# Patient Record
Sex: Male | Born: 1955
Health system: Southern US, Community
[De-identification: ages and names within clinical notes are randomized; demographics above are authoritative.]

## PROBLEM LIST (undated history)

## (undated) DIAGNOSIS — J9621 Acute and chronic respiratory failure with hypoxia: Secondary | ICD-10-CM

## (undated) DIAGNOSIS — I69298 Other sequelae of other nontraumatic intracranial hemorrhage: Secondary | ICD-10-CM

## (undated) DIAGNOSIS — J9811 Atelectasis: Secondary | ICD-10-CM

## (undated) DIAGNOSIS — I10 Essential (primary) hypertension: Secondary | ICD-10-CM

## (undated) DIAGNOSIS — I219 Acute myocardial infarction, unspecified: Secondary | ICD-10-CM

## (undated) DIAGNOSIS — N182 Chronic kidney disease, stage 2 (mild): Secondary | ICD-10-CM

## (undated) DIAGNOSIS — I63512 Cerebral infarction due to unspecified occlusion or stenosis of left middle cerebral artery: Secondary | ICD-10-CM

## (undated) HISTORY — PX: BACK SURGERY: SHX140

---

## 2012-04-11 DIAGNOSIS — I5042 Chronic combined systolic (congestive) and diastolic (congestive) heart failure: Secondary | ICD-10-CM | POA: Insufficient documentation

## 2012-08-04 ENCOUNTER — Encounter (HOSPITAL_COMMUNITY): Payer: Self-pay | Admitting: *Deleted

## 2012-08-04 ENCOUNTER — Emergency Department (HOSPITAL_COMMUNITY)
Admission: EM | Admit: 2012-08-04 | Discharge: 2012-08-04 | Disposition: A | Discharge status: Home / Self Care | Payer: Medicare Other | Attending: Emergency Medicine | Admitting: Emergency Medicine

## 2012-08-04 ENCOUNTER — Emergency Department (HOSPITAL_COMMUNITY): Payer: Medicare Other

## 2012-08-04 DIAGNOSIS — IMO0002 Reserved for concepts with insufficient information to code with codable children: Secondary | ICD-10-CM | POA: Insufficient documentation

## 2012-08-04 DIAGNOSIS — M549 Dorsalgia, unspecified: Secondary | ICD-10-CM

## 2012-08-04 DIAGNOSIS — Z87891 Personal history of nicotine dependence: Secondary | ICD-10-CM | POA: Diagnosis not present

## 2012-08-04 DIAGNOSIS — M5416 Radiculopathy, lumbar region: Secondary | ICD-10-CM

## 2012-08-04 DIAGNOSIS — I252 Old myocardial infarction: Secondary | ICD-10-CM | POA: Diagnosis not present

## 2012-08-04 DIAGNOSIS — Y9389 Activity, other specified: Secondary | ICD-10-CM | POA: Insufficient documentation

## 2012-08-04 DIAGNOSIS — Z79899 Other long term (current) drug therapy: Secondary | ICD-10-CM | POA: Diagnosis not present

## 2012-08-04 DIAGNOSIS — Z7982 Long term (current) use of aspirin: Secondary | ICD-10-CM | POA: Insufficient documentation

## 2012-08-04 DIAGNOSIS — I10 Essential (primary) hypertension: Secondary | ICD-10-CM | POA: Insufficient documentation

## 2012-08-04 DIAGNOSIS — R209 Unspecified disturbances of skin sensation: Secondary | ICD-10-CM | POA: Diagnosis not present

## 2012-08-04 DIAGNOSIS — Y929 Unspecified place or not applicable: Secondary | ICD-10-CM | POA: Insufficient documentation

## 2012-08-04 DIAGNOSIS — W010XXA Fall on same level from slipping, tripping and stumbling without subsequent striking against object, initial encounter: Secondary | ICD-10-CM | POA: Insufficient documentation

## 2012-08-04 HISTORY — DX: Essential (primary) hypertension: I10

## 2012-08-04 HISTORY — DX: Acute myocardial infarction, unspecified: I21.9

## 2012-08-04 MED ORDER — KETOROLAC TROMETHAMINE 30 MG/ML IJ SOLN
30.0000 mg | Freq: Once | INTRAMUSCULAR | Status: AC
  Administered 2012-08-04: 30 mg via INTRAVENOUS
  Filled 2012-08-04: qty 1

## 2012-08-04 MED ORDER — NAPROXEN 375 MG PO TABS: 375.0000 mg | ORAL_TABLET | Freq: Two times a day (BID) | ORAL | Status: DC

## 2012-08-04 MED ORDER — PREDNISONE 20 MG PO TABS: ORAL_TABLET | ORAL | Status: DC

## 2012-08-04 MED ORDER — SODIUM CHLORIDE 0.9 % IV BOLUS (SEPSIS)
500.0000 mL | Freq: Once | INTRAVENOUS | Status: AC
  Administered 2012-08-04: 500 mL via INTRAVENOUS

## 2012-08-04 MED ORDER — OXYCODONE-ACETAMINOPHEN 5-325 MG PO TABS: 2.0000 | ORAL_TABLET | ORAL | Status: DC | PRN

## 2012-08-04 MED ORDER — ONDANSETRON HCL 4 MG/2ML IJ SOLN
4.0000 mg | Freq: Once | INTRAMUSCULAR | Status: AC
  Administered 2012-08-04: 4 mg via INTRAVENOUS
  Filled 2012-08-04: qty 2

## 2012-08-04 MED ORDER — HYDROMORPHONE HCL PF 1 MG/ML IJ SOLN
0.5000 mg | Freq: Once | INTRAMUSCULAR | Status: AC
  Administered 2012-08-04: 0.5 mg via INTRAVENOUS
  Filled 2012-08-04: qty 1

## 2012-08-04 NOTE — ED Provider Notes (Signed)
History     CSN: [id number]  Arrival date & time 08/04/12  1233   First MD Initiated Contact with Patient 08/04/12 1536      Chief Complaint  Patient presents with  . Leg Pain  . Numbness    (Consider location/radiation/quality/duration/timing/severity/associated sxs/prior treatment) HPI.... low back pain for 2 weeks after slipping on ice with radiation to left leg. No bowel or bladder incontinence. Patient is able to walk. Status post back surgery approximately 10 years ago for 3 herniated disc. Ambulation makes pain worse. Quality is sharp. Severity is moderate.  Past Medical History  Diagnosis Date  . Hypertension   . MI (myocardial infarction)     Past Surgical History  Procedure Laterality Date  . Back surgery      2004    No family history on file.  History  Substance Use Topics  . Smoking status: Former Smoker    Types: Cigarettes    Quit date: 07/21/2012  . Smokeless tobacco: Not on file  . Alcohol Use: Yes     Comment: occasionally      Review of Systems  All other systems reviewed and are negative.    Allergies  Review of patient's allergies indicates no known allergies.  Home Medications   Current Outpatient Rx  Name  Route  Sig  Dispense  Refill  . acetaminophen (TYLENOL) 500 MG tablet   Oral   Take 1,500 mg by mouth every 6 (six) hours as needed for pain.         Marland Kitchen amLODipine (NORVASC) 5 MG tablet   Oral   Take 5 mg by mouth every morning.          Marland Kitchen aspirin EC 81 MG tablet   Oral   Take 81 mg by mouth every morning.         . carvedilol (COREG) 6.25 MG tablet   Oral   Take 3.125 mg by mouth 2 (two) times daily with a meal.         . hydrochlorothiazide (HYDRODIURIL) 25 MG tablet   Oral   Take 25 mg by mouth daily.         Marland Kitchen lisinopril (PRINIVIL,ZESTRIL) 30 MG tablet   Oral   Take 30 mg by mouth daily.         . pravastatin (PRAVACHOL) 40 MG tablet   Oral   Take 40 mg by mouth daily.         . ranitidine  (ZANTAC) 150 MG tablet   Oral   Take 150 mg by mouth daily as needed for heartburn.         . naproxen (NAPROSYN) 375 MG tablet   Oral   Take 1 tablet (375 mg total) by mouth 2 (two) times daily.   20 tablet   0   . oxyCODONE-acetaminophen (PERCOCET) 5-325 MG per tablet   Oral   Take 2 tablets by mouth every 4 (four) hours as needed for pain.   25 tablet   0   . predniSONE (DELTASONE) 20 MG tablet      3 tabs po day one, then 2 tabs daily x 4 days   11 tablet   0     BP 153/71  Pulse 60  Temp(Src) 99.9 F (37.7 C) (Oral)  Resp 15  SpO2 99%  Physical Exam  Nursing note and vitals reviewed. Constitutional: He is oriented to person, place, and time. He appears well-developed and well-nourished.  HENT:  Head: Normocephalic and  atraumatic.  Eyes: Conjunctivae and EOM are normal. Pupils are equal, round, and reactive to light.  Neck: Normal range of motion. Neck supple.  Cardiovascular: Normal rate, regular rhythm and normal heart sounds.   Pulmonary/Chest: Effort normal and breath sounds normal.  Abdominal: Soft. Bowel sounds are normal.  Musculoskeletal:  Generalized tenderness lumbar spine. Pain with straight leg raise left greater than right.  Neurological: He is alert and oriented to person, place, and time.  No gross neuro deficits.  Skin: Skin is warm and dry.  Psychiatric: He has a normal mood and affect.    ED Course  Procedures (including critical care time)  Labs Reviewed - No data to display Dg Lumbar Spine Complete  08/04/2012  *RADIOLOGY REPORT*  Clinical Data: Back and left leg pain.  Fell 1 week ago.  LUMBAR SPINE - COMPLETE 4+ VIEW  Comparison: None.  Findings: Advanced degenerative lumbar spondylosis with multilevel disc disease.  No pars defects.  There is a compression deformity of the T11 vertebral body of uncertain age.  The bony pelvis is intact.  IMPRESSION:  1.  Degenerative lumbar spondylosis with multilevel disc disease. 2.  Compression  deformity of T11 of uncertain age.   Original Report Authenticated By: Rudie Meyer, M.D.      1. Back pain   2. Radiculopathy, lumbar region       MDM  Plain films of lumbar spine show degenerative issues at multi levels.  Additionally a compression fracture of T11 is noted.  Patient will followup with his back surgeon in [place]. Prescriptions for Percocet #25, prednisone taper, Naprosyn 375 mg #20        Donnetta Hutching, MD 08/04/12 1907

## 2012-08-04 NOTE — ED Notes (Signed)
PT slipped on ice and fell 2 weeks ago.  Since then he has been experiencing increasing pain and numbness to L leg.  Denies changes in bowel/bladder habits.  Pt states hx of lumbar back surgery 10  Years prior for same s/s, and has had no symptoms since until 2 weeks ago when he fell.

## 2013-08-02 ENCOUNTER — Emergency Department (INDEPENDENT_AMBULATORY_CARE_PROVIDER_SITE_OTHER)
Admission: EM | Admit: 2013-08-02 | Discharge: 2013-08-02 | Disposition: A | Discharge status: Home / Self Care | Payer: Medicare Other | Source: Home / Self Care

## 2013-08-02 ENCOUNTER — Encounter (HOSPITAL_COMMUNITY): Payer: Self-pay | Admitting: Family Medicine

## 2013-08-02 DIAGNOSIS — J069 Acute upper respiratory infection, unspecified: Secondary | ICD-10-CM

## 2013-08-02 DIAGNOSIS — I10 Essential (primary) hypertension: Secondary | ICD-10-CM

## 2013-08-02 DIAGNOSIS — E785 Hyperlipidemia, unspecified: Secondary | ICD-10-CM

## 2013-08-02 MED ORDER — PRAVASTATIN SODIUM 40 MG PO TABS: 40.0000 mg | ORAL_TABLET | Freq: Every day | ORAL | Status: DC

## 2013-08-02 MED ORDER — LISINOPRIL 30 MG PO TABS: 30.0000 mg | ORAL_TABLET | Freq: Every day | ORAL | Status: DC

## 2013-08-02 MED ORDER — AMLODIPINE BESYLATE 5 MG PO TABS: 5.0000 mg | ORAL_TABLET | Freq: Every morning | ORAL | Status: DC

## 2013-08-02 MED ORDER — CARVEDILOL 6.25 MG PO TABS: 3.1250 mg | ORAL_TABLET | Freq: Two times a day (BID) | ORAL | Status: DC

## 2013-08-02 MED ORDER — HYDROCHLOROTHIAZIDE 25 MG PO TABS: 25.0000 mg | ORAL_TABLET | Freq: Every day | ORAL | Status: DC

## 2013-08-02 NOTE — Discharge Instructions (Signed)
Your blood pressure and high cholesterol appear to be well controlled Please clal [facility]  Tomorrow to set up an appointment for your primary care Your  Medications were refilled for 60 days Please come back if you have any further concerns.

## 2013-08-02 NOTE — ED Notes (Signed)
Patient here for medication refill.

## 2013-08-02 NOTE — ED Provider Notes (Signed)
CSN: [id number]     Arrival date & time 08/02/13  1614 History   None    Chief Complaint  Patient presents with  . Medication Refill   (Consider location/radiation/quality/duration/timing/severity/associated sxs/prior Treatment) HPI  Pt here for medication refill. Pt w/o PCP as previous at [facility] has left. Full hx reviewed. Pt ran out of amlodipine, and carvedilol and pravastatin today. Still w/ few HCTZ and lisinopril. Denies CP, SOB, Palpitations, syncope, HA, N,V,D. No difficulty urinating. Last PCP visit 07/2012. Pt going to go back in 30-60 days. Denies any renal impairment.   Past Medical History  Diagnosis Date  . Hypertension   . MI (myocardial infarction)    Past Surgical History  Procedure Laterality Date  . Back surgery      2004   Family History  Problem Relation Age of Onset  . Hypertension Mother    History  Substance Use Topics  . Smoking status: Former Smoker    Types: Cigarettes    Quit date: 07/21/2012  . Smokeless tobacco: Not on file  . Alcohol Use: Yes     Comment: occasionally    Review of Systems  Constitutional: Negative for fever and chills.  Respiratory: Negative for shortness of breath.   Cardiovascular: Negative for chest pain, palpitations and leg swelling.  Gastrointestinal: Negative for abdominal pain and blood in stool.  Genitourinary: Negative for dysuria, urgency and decreased urine volume.  Musculoskeletal: Negative for myalgias.  Skin: Negative for rash.  All other systems reviewed and are negative.    Allergies  Review of patient's allergies indicates no known allergies.  Home Medications   Current Outpatient Rx  Name  Route  Sig  Dispense  Refill  . acetaminophen (TYLENOL) 500 MG tablet   Oral   Take 1,500 mg by mouth every 6 (six) hours as needed for pain.         Marland Kitchen amLODipine (NORVASC) 5 MG tablet   Oral   Take 1 tablet (5 mg total) by mouth every morning.   30 tablet   1   . aspirin EC 81 MG tablet   Oral   Take  81 mg by mouth every morning.         . carvedilol (COREG) 6.25 MG tablet   Oral   Take 0.5 tablets (3.125 mg total) by mouth 2 (two) times daily with a meal.   30 tablet   1   . hydrochlorothiazide (HYDRODIURIL) 25 MG tablet   Oral   Take 1 tablet (25 mg total) by mouth daily.   30 tablet   1   . lisinopril (PRINIVIL,ZESTRIL) 30 MG tablet   Oral   Take 1 tablet (30 mg total) by mouth daily.   30 tablet   1   . naproxen (NAPROSYN) 375 MG tablet   Oral   Take 1 tablet (375 mg total) by mouth 2 (two) times daily.   20 tablet   0   . oxyCODONE-acetaminophen (PERCOCET) 5-325 MG per tablet   Oral   Take 2 tablets by mouth every 4 (four) hours as needed for pain.   25 tablet   0   . pravastatin (PRAVACHOL) 40 MG tablet   Oral   Take 1 tablet (40 mg total) by mouth daily.   30 tablet   1   . predniSONE (DELTASONE) 20 MG tablet      3 tabs po day one, then 2 tabs daily x 4 days   11 tablet   0   .  ranitidine (ZANTAC) 150 MG tablet   Oral   Take 150 mg by mouth daily as needed for heartburn.          BP 122/79  Pulse 59  Temp(Src) 98.8 F (37.1 C) (Oral)  Resp 16  SpO2 100% Physical Exam  Constitutional: He appears well-developed and well-nourished. No distress.  HENT:  Head: Normocephalic and atraumatic.  Eyes: EOM are normal. Pupils are equal, round, and reactive to light.  Neck: Normal range of motion. Neck supple.  Cardiovascular: Normal rate, regular rhythm, normal heart sounds and intact distal pulses.  Exam reveals no gallop and no friction rub.   No murmur heard. Pulmonary/Chest: Effort normal. No respiratory distress. He has wheezes. He has no rales. He exhibits no tenderness.  Abdominal: Soft. He exhibits no distension.  Musculoskeletal: Normal range of motion. He exhibits no edema and no tenderness.  Neurological: He is alert.  Skin: Skin is warm. No rash noted. He is not diaphoretic.  Psychiatric: He has a normal mood and affect. His behavior  is normal. Judgment and thought content normal.    ED Course  Procedures (including critical care time) Labs Review Labs Reviewed - No data to display Imaging Review No results found.   MDM   1. Hypertension   2. URI (upper respiratory infection)   3. HLD (hyperlipidemia)    HTN: at goal today. Refill medications for 60 days to allow time for re-establishment of care at [facility]. Pt aware of warning signs for MI or elevated/low BP  URI: wheezing on exam but better per pt. Denies any SOB or asthma or COPD hx. Pt w/ occasional cigarette use (once in a blue moon). No need for steroids or ABX at this time. No h/o inh use  HLD: h/o MI concerning but on statin. Likely will need increased potency dose. Refill prava at this time. F/u [facility]  Linna Darner, MD Family Medicine PGY-3 08/02/2013, 6:08 PM      Waldemar Dickens, MD 08/02/13 [phone]

## 2013-08-03 NOTE — ED Provider Notes (Signed)
Medical screening examination/treatment/procedure(s) were performed by a resident physician or non-physician practitioner and as the supervising physician I was immediately available for consultation/collaboration.  Lynne Leader, MD    Gregor Hams, MD 08/03/13 [phone]

## 2013-10-17 ENCOUNTER — Emergency Department (INDEPENDENT_AMBULATORY_CARE_PROVIDER_SITE_OTHER)
Admission: EM | Admit: 2013-10-17 | Discharge: 2013-10-17 | Disposition: A | Discharge status: Home / Self Care | Payer: Medicare Other | Source: Home / Self Care | Attending: Family Medicine | Admitting: Family Medicine

## 2013-10-17 ENCOUNTER — Encounter (HOSPITAL_COMMUNITY): Payer: Self-pay | Admitting: Emergency Medicine

## 2013-10-17 DIAGNOSIS — I10 Essential (primary) hypertension: Secondary | ICD-10-CM | POA: Diagnosis not present

## 2013-10-17 MED ORDER — AMLODIPINE BESYLATE 5 MG PO TABS: 5.0000 mg | ORAL_TABLET | Freq: Every morning | ORAL | Status: DC

## 2013-10-17 MED ORDER — LISINOPRIL 30 MG PO TABS: 30.0000 mg | ORAL_TABLET | Freq: Every day | ORAL | Status: DC

## 2013-10-17 MED ORDER — CARVEDILOL 6.25 MG PO TABS: 3.1250 mg | ORAL_TABLET | Freq: Two times a day (BID) | ORAL | Status: DC

## 2013-10-17 MED ORDER — PRAVASTATIN SODIUM 40 MG PO TABS: 40.0000 mg | ORAL_TABLET | Freq: Every day | ORAL | Status: DC

## 2013-10-17 MED ORDER — HYDROCHLOROTHIAZIDE 25 MG PO TABS: 25.0000 mg | ORAL_TABLET | Freq: Every day | ORAL | Status: DC

## 2013-10-17 NOTE — ED Provider Notes (Signed)
CSN: [id number]     Arrival date & time 10/17/13  1330 History   First MD Initiated Contact with Patient 10/17/13 1409     Chief Complaint  Patient presents with  . Medication Refill   (Consider location/radiation/quality/duration/timing/severity/associated sxs/prior Treatment) Patient is a 58 y.o. male presenting with hypertension. The history is provided by the patient.  Hypertension This is a chronic problem. Episode onset: here for med refills on bp, no local md, seen here 3/17 last for refills. The problem has not changed since onset.Pertinent negatives include no chest pain, no headaches and no shortness of breath.    Past Medical History  Diagnosis Date  . Hypertension   . MI (myocardial infarction)    Past Surgical History  Procedure Laterality Date  . Back surgery      2004   Family History  Problem Relation Age of Onset  . Hypertension Mother    History  Substance Use Topics  . Smoking status: Former Smoker    Types: Cigarettes    Quit date: 07/21/2012  . Smokeless tobacco: Not on file  . Alcohol Use: Yes     Comment: occasionally    Review of Systems  Constitutional: Negative.   Respiratory: Negative.  Negative for shortness of breath.   Cardiovascular: Negative.  Negative for chest pain and leg swelling.  Neurological: Negative for headaches.    Allergies  Review of patient's allergies indicates no known allergies.  Home Medications   Prior to Admission medications   Medication Sig Start Date End Date Taking? Authorizing Provider  aspirin EC 81 MG tablet Take 81 mg by mouth every morning.   Yes Historical Provider, MD  acetaminophen (TYLENOL) 500 MG tablet Take 1,500 mg by mouth every 6 (six) hours as needed for pain.    Historical Provider, MD  amLODipine (NORVASC) 5 MG tablet Take 1 tablet (5 mg total) by mouth every morning. 10/17/13   Billy Fischer, MD  carvedilol (COREG) 6.25 MG tablet Take 0.5 tablets (3.125 mg total) by mouth 2 (two) times daily  with a meal. 10/17/13   Billy Fischer, MD  hydrochlorothiazide (HYDRODIURIL) 25 MG tablet Take 1 tablet (25 mg total) by mouth daily. 10/17/13   Billy Fischer, MD  lisinopril (PRINIVIL,ZESTRIL) 30 MG tablet Take 1 tablet (30 mg total) by mouth daily. 10/17/13   Billy Fischer, MD  naproxen (NAPROSYN) 375 MG tablet Take 1 tablet (375 mg total) by mouth 2 (two) times daily. 08/04/12   Nat Christen, MD  oxyCODONE-acetaminophen (PERCOCET) 5-325 MG per tablet Take 2 tablets by mouth every 4 (four) hours as needed for pain. 08/04/12   Nat Christen, MD  pravastatin (PRAVACHOL) 40 MG tablet Take 1 tablet (40 mg total) by mouth daily. 10/17/13   Billy Fischer, MD  predniSONE (DELTASONE) 20 MG tablet 3 tabs po day one, then 2 tabs daily x 4 days 08/04/12   Nat Christen, MD  ranitidine (ZANTAC) 150 MG tablet Take 150 mg by mouth daily as needed for heartburn.    Historical Provider, MD   BP 145/93  Pulse 60  Temp(Src) 98.7 F (37.1 C) (Oral)  Resp 16  SpO2 98% Physical Exam  Nursing note and vitals reviewed. Constitutional: He is oriented to person, place, and time. He appears well-developed and well-nourished. No distress.  Neck: Normal range of motion. Neck supple. No thyromegaly present.  Cardiovascular: Regular rhythm and normal heart sounds.   Pulmonary/Chest: Effort normal and breath sounds normal.  Musculoskeletal: He  exhibits no edema.  Lymphadenopathy:    He has no cervical adenopathy.  Neurological: He is alert and oriented to person, place, and time.  Skin: Skin is warm and dry.    ED Course  Procedures (including critical care time) Labs Review Labs Reviewed - No data to display  Imaging Review No results found.   MDM   1. Hypertension        Billy Fischer, MD 10/17/13 1441

## 2013-10-17 NOTE — ED Notes (Signed)
States he needs Rx refills

## 2013-10-17 NOTE — Discharge Instructions (Signed)
See your doctor for further refills. °

## 2013-11-08 ENCOUNTER — Encounter (HOSPITAL_COMMUNITY): Payer: Self-pay | Admitting: Emergency Medicine

## 2013-11-08 ENCOUNTER — Emergency Department (INDEPENDENT_AMBULATORY_CARE_PROVIDER_SITE_OTHER)
Admission: EM | Admit: 2013-11-08 | Discharge: 2013-11-08 | Disposition: A | Discharge status: Home / Self Care | Payer: Medicare Other | Source: Home / Self Care | Attending: Family Medicine | Admitting: Family Medicine

## 2013-11-08 DIAGNOSIS — B86 Scabies: Secondary | ICD-10-CM | POA: Diagnosis not present

## 2013-11-08 DIAGNOSIS — L219 Seborrheic dermatitis, unspecified: Secondary | ICD-10-CM

## 2013-11-08 MED ORDER — PERMETHRIN 5 % EX CREA: TOPICAL_CREAM | CUTANEOUS | Status: DC

## 2013-11-08 MED ORDER — HYDROCORTISONE VALERATE 0.2 % EX OINT: 1.0000 "application " | TOPICAL_OINTMENT | Freq: Two times a day (BID) | CUTANEOUS | Status: DC

## 2013-11-08 NOTE — ED Provider Notes (Signed)
CSN: [id number]     Arrival date & time 11/08/13  1400 History   First MD Initiated Contact with Patient 11/08/13 1503     Chief Complaint  Patient presents with  . Rash   (Consider location/radiation/quality/duration/timing/severity/associated sxs/prior Treatment) Patient is a 58 y.o. male presenting with rash. The history is provided by the patient.  Rash Location:  Shoulder/arm, face, torso and finger Facial rash location:  Face Shoulder/arm rash location:  L hand, R hand, L forearm and R forearm Quality: dryness, itchiness and scaling   Severity:  Mild Onset quality:  Sudden Duration:  2 weeks Progression:  Worsening Chronicity:  New Context comment:  Girlfriend with scabies., similar rash.   Past Medical History  Diagnosis Date  . Hypertension   . MI (myocardial infarction)    Past Surgical History  Procedure Laterality Date  . Back surgery      2004   Family History  Problem Relation Age of Onset  . Hypertension Mother    History  Substance Use Topics  . Smoking status: Former Smoker    Types: Cigarettes    Quit date: 07/21/2012  . Smokeless tobacco: Not on file  . Alcohol Use: Yes     Comment: occasionally    Review of Systems  Constitutional: Negative.   Skin: Positive for rash.    Allergies  Review of patient's allergies indicates no known allergies.  Home Medications   Prior to Admission medications   Medication Sig Start Date End Date Taking? Authorizing Provider  amLODipine (NORVASC) 5 MG tablet Take 1 tablet (5 mg total) by mouth every morning. 10/17/13  Yes Billy Fischer, MD  carvedilol (COREG) 6.25 MG tablet Take 0.5 tablets (3.125 mg total) by mouth 2 (two) times daily with a meal. 10/17/13  Yes Billy Fischer, MD  hydrochlorothiazide (HYDRODIURIL) 25 MG tablet Take 1 tablet (25 mg total) by mouth daily. 10/17/13  Yes Billy Fischer, MD  lisinopril (PRINIVIL,ZESTRIL) 30 MG tablet Take 1 tablet (30 mg total) by mouth daily. 10/17/13  Yes Billy Fischer, MD  pravastatin (PRAVACHOL) 40 MG tablet Take 1 tablet (40 mg total) by mouth daily. 10/17/13  Yes Billy Fischer, MD  acetaminophen (TYLENOL) 500 MG tablet Take 1,500 mg by mouth every 6 (six) hours as needed for pain.    Historical Provider, MD  aspirin EC 81 MG tablet Take 81 mg by mouth every morning.    Historical Provider, MD  hydrocortisone valerate ointment (WEST-CORT) 0.2 % Apply 1 application topically 2 (two) times daily. To eyebrows and forehead as needed. 11/08/13   Billy Fischer, MD  naproxen (NAPROSYN) 375 MG tablet Take 1 tablet (375 mg total) by mouth 2 (two) times daily. 08/04/12   Nat Christen, MD  oxyCODONE-acetaminophen (PERCOCET) 5-325 MG per tablet Take 2 tablets by mouth every 4 (four) hours as needed for pain. 08/04/12   Nat Christen, MD  permethrin (ELIMITE) 5 % cream Apply as directed, from neck down, repeat in 1 week. 11/08/13   Billy Fischer, MD  predniSONE (DELTASONE) 20 MG tablet 3 tabs po day one, then 2 tabs daily x 4 days 08/04/12   Nat Christen, MD  ranitidine (ZANTAC) 150 MG tablet Take 150 mg by mouth daily as needed for heartburn.    Historical Provider, MD   BP 142/90  Pulse 74  Temp(Src) 99.2 F (37.3 C) (Oral)  Resp 16  SpO2 97% Physical Exam  Nursing note and vitals reviewed. Constitutional: He is  oriented to person, place, and time. He appears well-developed and well-nourished.  Neurological: He is alert and oriented to person, place, and time.  Skin: Skin is warm and dry. Rash noted.  papulo crusting lesions on ext with dry scaly rash on eyebrows.    ED Course  Procedures (including critical care time) Labs Review Labs Reviewed - No data to display  Imaging Review No results found.   MDM   1. Scabies   2. Seborrheic dermatitis        Billy Fischer, MD 11/08/13 1534

## 2013-11-08 NOTE — ED Notes (Signed)
Pt c/o rash onset 1 week on arms and face Reports GF is being treated for scabies Denies f/v/n/d, cold sx Alert w/no signs of acute distress.

## 2013-12-27 ENCOUNTER — Encounter: Payer: Self-pay | Admitting: Internal Medicine

## 2013-12-27 ENCOUNTER — Ambulatory Visit: Payer: Medicare Other | Attending: Internal Medicine | Admitting: Internal Medicine

## 2013-12-27 VITALS — BP 160/90 | HR 97 | Temp 97.8°F | Resp 18 | Ht 67.0 in | Wt 190.6 lb

## 2013-12-27 DIAGNOSIS — I10 Essential (primary) hypertension: Secondary | ICD-10-CM

## 2013-12-27 DIAGNOSIS — I252 Old myocardial infarction: Secondary | ICD-10-CM | POA: Diagnosis not present

## 2013-12-27 DIAGNOSIS — Z1211 Encounter for screening for malignant neoplasm of colon: Secondary | ICD-10-CM

## 2013-12-27 DIAGNOSIS — M549 Dorsalgia, unspecified: Secondary | ICD-10-CM | POA: Diagnosis not present

## 2013-12-27 DIAGNOSIS — E785 Hyperlipidemia, unspecified: Secondary | ICD-10-CM | POA: Diagnosis present

## 2013-12-27 DIAGNOSIS — Z9889 Other specified postprocedural states: Secondary | ICD-10-CM | POA: Diagnosis not present

## 2013-12-27 DIAGNOSIS — Z7982 Long term (current) use of aspirin: Secondary | ICD-10-CM | POA: Insufficient documentation

## 2013-12-27 DIAGNOSIS — Z8249 Family history of ischemic heart disease and other diseases of the circulatory system: Secondary | ICD-10-CM | POA: Insufficient documentation

## 2013-12-27 DIAGNOSIS — IMO0001 Reserved for inherently not codable concepts without codable children: Secondary | ICD-10-CM | POA: Insufficient documentation

## 2013-12-27 DIAGNOSIS — F17200 Nicotine dependence, unspecified, uncomplicated: Secondary | ICD-10-CM | POA: Diagnosis not present

## 2013-12-27 LAB — COMPLETE METABOLIC PANEL WITH GFR
ALBUMIN: 4.9 g/dL (ref 3.5–5.2)
ALT: 21 U/L (ref 0–53)
AST: 22 U/L (ref 0–37)
Alkaline Phosphatase: 45 U/L (ref 39–117)
BILIRUBIN TOTAL: 0.5 mg/dL (ref 0.2–1.2)
BUN: 27 mg/dL — ABNORMAL HIGH (ref 6–23)
CALCIUM: 10.5 mg/dL (ref 8.4–10.5)
CHLORIDE: 108 meq/L (ref 96–112)
CO2: 25 meq/L (ref 19–32)
Creat: 1.59 mg/dL — ABNORMAL HIGH (ref 0.50–1.35)
GFR, EST AFRICAN AMERICAN: 54 mL/min — AB
GFR, Est Non African American: 46 mL/min — ABNORMAL LOW
GLUCOSE: 114 mg/dL — AB (ref 70–99)
POTASSIUM: 4.2 meq/L (ref 3.5–5.3)
SODIUM: 140 meq/L (ref 135–145)
TOTAL PROTEIN: 7.6 g/dL (ref 6.0–8.3)

## 2013-12-27 LAB — LIPID PANEL
Cholesterol: 142 mg/dL (ref 0–200)
HDL: 37 mg/dL — ABNORMAL LOW (ref >39)
LDL CALC: 65 mg/dL (ref 0–99)
TRIGLYCERIDES: 198 mg/dL — AB (ref <150)
Total CHOL/HDL Ratio: 3.8 Ratio
VLDL: 40 mg/dL (ref 0–40)

## 2013-12-27 LAB — CBC WITH DIFFERENTIAL/PLATELET
Basophils Absolute: 0.1 10*3/uL (ref 0.0–0.1)
Basophils Relative: 1 % (ref 0–1)
Eosinophils Absolute: 0.7 10*3/uL (ref 0.0–0.7)
Eosinophils Relative: 11 % — ABNORMAL HIGH (ref 0–5)
HCT: 37.9 % — ABNORMAL LOW (ref 39.0–52.0)
HEMOGLOBIN: 12.9 g/dL — AB (ref 13.0–17.0)
LYMPHS ABS: 1.5 10*3/uL (ref 0.7–4.0)
LYMPHS PCT: 24 % (ref 12–46)
MCH: 31.7 pg (ref 26.0–34.0)
MCHC: 34 g/dL (ref 30.0–36.0)
MCV: 93.1 fL (ref 78.0–100.0)
MONOS PCT: 9 % (ref 3–12)
Monocytes Absolute: 0.6 10*3/uL (ref 0.1–1.0)
NEUTROS ABS: 3.5 10*3/uL (ref 1.7–7.7)
NEUTROS PCT: 55 % (ref 43–77)
PLATELETS: 358 10*3/uL (ref 150–400)
RBC: 4.07 MIL/uL — AB (ref 4.22–5.81)
RDW: 15.4 % (ref 11.5–15.5)
WBC: 6.3 10*3/uL (ref 4.0–10.5)

## 2013-12-27 MED ORDER — AMLODIPINE BESYLATE 5 MG PO TABS: 5.0000 mg | ORAL_TABLET | Freq: Every morning | ORAL | Status: DC

## 2013-12-27 MED ORDER — CARVEDILOL 6.25 MG PO TABS: 3.1250 mg | ORAL_TABLET | Freq: Two times a day (BID) | ORAL | Status: DC

## 2013-12-27 MED ORDER — PRAVASTATIN SODIUM 40 MG PO TABS: 40.0000 mg | ORAL_TABLET | Freq: Every day | ORAL | Status: DC

## 2013-12-27 MED ORDER — HYDROCHLOROTHIAZIDE 25 MG PO TABS: 25.0000 mg | ORAL_TABLET | Freq: Every day | ORAL | Status: DC

## 2013-12-27 MED ORDER — CLONIDINE HCL 0.1 MG PO TABS
0.2000 mg | ORAL_TABLET | Freq: Once | ORAL | Status: AC
  Administered 2013-12-27: 0.2 mg via ORAL

## 2013-12-27 MED ORDER — LISINOPRIL 30 MG PO TABS: 30.0000 mg | ORAL_TABLET | Freq: Every day | ORAL | Status: DC

## 2013-12-27 NOTE — Progress Notes (Signed)
Pt comes in to establish care for Hx uncontrolled hypertension Need all medication refilled Ran out yesterday BP- 187/110 states he didn't take medication today Denies chest pain or headache

## 2013-12-27 NOTE — Progress Notes (Signed)
Patient Demographics  Riley Wagner, is a 58 y.o. male  OFB:[id number]  POE:[id number]  DOB - 1955/10/23  CC:  Chief Complaint  Patient presents with  . Establish Care  . Hypertension  . Hyperlipidemia  . Medication Refill       HPI: Riley Wagner is a 58 y.o. male here today to establish medical care. Patient has history of hypertension hyperlipidemia, as per patient he was told that he had MI in the past, denies any chest and shortness of breath, patient is requesting refill on his medications today his blood pressure was elevated since he ran out of his meds, he was given clonidine, repeat manual blood pressure is 160/90. Patient also history of chronic back pain had surgery done in the past, takes Aleve when necessary.  Patient has No headache, No chest pain, No abdominal pain - No Nausea, No new weakness tingling or numbness, No Cough - SOB.  No Known Allergies Past Medical History  Diagnosis Date  . Hypertension   . MI (myocardial infarction)    Current Outpatient Prescriptions on File Prior to Visit  Medication Sig Dispense Refill  . acetaminophen (TYLENOL) 500 MG tablet Take 1,500 mg by mouth every 6 (six) hours as needed for pain.      Marland Kitchen aspirin EC 81 MG tablet Take 81 mg by mouth every morning.      . hydrocortisone valerate ointment (WEST-CORT) 0.2 % Apply 1 application topically 2 (two) times daily. To eyebrows and forehead as needed.  15 g  1  . naproxen (NAPROSYN) 375 MG tablet Take 1 tablet (375 mg total) by mouth 2 (two) times daily.  20 tablet  0  . oxyCODONE-acetaminophen (PERCOCET) 5-325 MG per tablet Take 2 tablets by mouth every 4 (four) hours as needed for pain.  25 tablet  0  . permethrin (ELIMITE) 5 % cream Apply as directed, from neck down, repeat in 1 week.  60 g  1  . predniSONE (DELTASONE) 20 MG tablet 3 tabs po day one, then 2 tabs daily x 4 days  11 tablet  0  . ranitidine (ZANTAC) 150 MG tablet Take 150 mg by mouth daily as needed for heartburn.        No current facility-administered medications on file prior to visit.   Family History  Problem Relation Age of Onset  . Hypertension Mother   . Diabetes Mother   . Stroke Mother    History   Social History  . Marital Status: Single    Spouse Name: N/A    Number of Children: N/A  . Years of Education: N/A   Occupational History  . Not on file.   Social History Main Topics  . Smoking status: Current Every Day Smoker    Types: Cigarettes    Last Attempt to Quit: 07/21/2012  . Smokeless tobacco: Not on file  . Alcohol Use: Yes     Comment: occasionally  . Drug Use: No  . Sexual Activity: Not on file   Other Topics Concern  . Not on file   Social History Narrative  . No narrative on file    Review of Systems: Constitutional: Negative for fever, chills, diaphoresis, activity change, appetite change and fatigue. HENT: Negative for ear pain, nosebleeds, congestion, facial swelling, rhinorrhea, neck pain, neck stiffness and ear discharge.  Eyes: Negative for pain, discharge, redness, itching and visual disturbance. Respiratory: Negative for cough, choking, chest tightness, shortness of breath, wheezing and stridor.  Cardiovascular: Negative for  chest pain, palpitations and leg swelling. Gastrointestinal: Negative for abdominal distention. Genitourinary: Negative for dysuria, urgency, frequency, hematuria, flank pain, decreased urine volume, difficulty urinating and dyspareunia.  Musculoskeletal: Negative for back pain, joint swelling, arthralgia and gait problem. Neurological: Negative for dizziness, tremors, seizures, syncope, facial asymmetry, speech difficulty, weakness, light-headedness, numbness and headaches.  Hematological: Negative for adenopathy. Does not bruise/bleed easily. Psychiatric/Behavioral: Negative for hallucinations, behavioral problems, confusion, dysphoric mood, decreased concentration and agitation.    Objective:   Filed Vitals:   12/27/13  1151  BP: 160/90  Pulse:   Temp:   Resp:     Physical Exam: Constitutional: Patient appears well-developed and well-nourished. No distress. HENT: Normocephalic, atraumatic, External right and left ear normal. Oropharynx is clear and moist.  Eyes: Conjunctivae and EOM are normal. PERRLA, no scleral icterus. Neck: Normal ROM. Neck supple. No JVD. No tracheal deviation. No thyromegaly. CVS: RRR, S1/S2 +, no murmurs, no gallops, no carotid bruit.  Pulmonary: Effort and breath sounds normal, no stridor, rhonchi, wheezes, rales.  Abdominal: Soft. BS +, no distension, tenderness, rebound or guarding.  Musculoskeletal: Normal range of motion. No edema and no tenderness.  Neuro: Alert. Normal reflexes, muscle tone coordination. No cranial nerve deficit. Skin: Skin is warm and dry. No rash noted. Not diaphoretic. No erythema. No pallor. Psychiatric: Normal mood and affect. Behavior, judgment, thought content normal.  No results found for this basename: WBC, HGB, HCT, MCV, PLT   No results found for this basename: CREATININE, BUN, NA, K, CL, CO2    No results found for this basename: HGBA1C   Lipid Panel  No results found for this basename: chol, trig, hdl, cholhdl, vldl, ldlcalc       Assessment and plan:   1. Essential hypertension Blood pressure is elevated because patient ran out of his meds, we'll resume back on his medications also advise for DASH diet, he'll come back in 2 weeks for BP check.  - cloNIDine (CATAPRES) tablet 0.2 mg; Take 2 tablets (0.2 mg total) by mouth once. - amLODipine (NORVASC) 5 MG tablet; Take 1 tablet (5 mg total) by mouth every morning.  Dispense: 30 tablet; Refill: 3 - carvedilol (COREG) 6.25 MG tablet; Take 0.5 tablets (3.125 mg total) by mouth 2 (two) times daily with a meal.  Dispense: 30 tablet; Refill: 3 - hydrochlorothiazide (HYDRODIURIL) 25 MG tablet; Take 1 tablet (25 mg total) by mouth daily.  Dispense: 30 tablet; Refill: 3 - lisinopril  (PRINIVIL,ZESTRIL) 30 MG tablet; Take 1 tablet (30 mg total) by mouth daily.  Dispense: 30 tablet; Refill: 3 - CBC with Differential - COMPLETE METABOLIC PANEL WITH GFR - TSH  2. Other and unspecified hyperlipidemia Patient is on Pravachol will repeat lipid panel. - pravastatin (PRAVACHOL) 40 MG tablet; Take 1 tablet (40 mg total) by mouth daily.  Dispense: 30 tablet; Refill: 3 - Lipid panel  3. History of back surgery Currently takes Aleve when necessary for pain.  4. Special screening for malignant neoplasms, colon Referred to GI.  6. Smoking Advised patient to quit smoking.       Health Maintenance -Colonoscopy: referred to GI   Return in about 3 months (around 03/29/2014) for hypertension, hyperipidemia, BP check in 2 weeks/Nurse Visit.    Lorayne Marek, MD

## 2013-12-27 NOTE — Patient Instructions (Signed)
DASH Eating Plan °DASH stands for "Dietary Approaches to Stop Hypertension." The DASH eating plan is a healthy eating plan that has been shown to reduce high blood pressure (hypertension). Additional health benefits may include reducing the risk of type 2 diabetes mellitus, heart disease, and stroke. The DASH eating plan may also help with weight loss. °WHAT DO I NEED TO KNOW ABOUT THE DASH EATING PLAN? °For the DASH eating plan, you will follow these general guidelines: °· Choose foods with a percent daily value for sodium of less than 5% (as listed on the food label). °· Use salt-free seasonings or herbs instead of table salt or sea salt. °· Check with your health care provider or pharmacist before using salt substitutes. °· Eat lower-sodium products, often labeled as "lower sodium" or "no salt added." °· Eat fresh foods. °· Eat more vegetables, fruits, and low-fat dairy products. °· Choose whole grains. Look for the word "whole" as the first word in the ingredient list. °· Choose fish and skinless chicken or turkey more often than red meat. Limit fish, poultry, and meat to 6 oz (170 g) each day. °· Limit sweets, desserts, sugars, and sugary drinks. °· Choose heart-healthy fats. °· Limit cheese to 1 oz (28 g) per day. °· Eat more home-cooked food and less restaurant, buffet, and fast food. °· Limit fried foods. °· Cook foods using methods other than frying. °· Limit canned vegetables. If you do use them, rinse them well to decrease the sodium. °· When eating at a restaurant, ask that your food be prepared with less salt, or no salt if possible. °WHAT FOODS CAN I EAT? °Seek help from a dietitian for individual calorie needs. °Grains °Whole grain or whole wheat bread. Brown rice. Whole grain or whole wheat pasta. Quinoa, bulgur, and whole grain cereals. Low-sodium cereals. Corn or whole wheat flour tortillas. Whole grain cornbread. Whole grain crackers. Low-sodium crackers. °Vegetables °Fresh or frozen vegetables  (raw, steamed, roasted, or grilled). Low-sodium or reduced-sodium tomato and vegetable juices. Low-sodium or reduced-sodium tomato sauce and paste. Low-sodium or reduced-sodium canned vegetables.  °Fruits °All fresh, canned (in natural juice), or frozen fruits. °Meat and Other Protein Products °Ground beef (85% or leaner), grass-fed beef, or beef trimmed of fat. Skinless chicken or turkey. Ground chicken or turkey. Pork trimmed of fat. All fish and seafood. Eggs. Dried beans, peas, or lentils. Unsalted nuts and seeds. Unsalted canned beans. °Dairy °Low-fat dairy products, such as skim or 1% milk, 2% or reduced-fat cheeses, low-fat ricotta or cottage cheese, or plain low-fat yogurt. Low-sodium or reduced-sodium cheeses. °Fats and Oils °Tub margarines without trans fats. Light or reduced-fat mayonnaise and salad dressings (reduced sodium). Avocado. Safflower, olive, or canola oils. Natural peanut or almond butter. °Other °Unsalted popcorn and pretzels. °The items listed above may not be a complete list of recommended foods or beverages. Contact your dietitian for more options. °WHAT FOODS ARE NOT RECOMMENDED? °Grains °White bread. White pasta. White rice. Refined cornbread. Bagels and croissants. Crackers that contain trans fat. °Vegetables °Creamed or fried vegetables. Vegetables in a cheese sauce. Regular canned vegetables. Regular canned tomato sauce and paste. Regular tomato and vegetable juices. °Fruits °Dried fruits. Canned fruit in light or heavy syrup. Fruit juice. °Meat and Other Protein Products °Fatty cuts of meat. Ribs, chicken wings, bacon, sausage, bologna, salami, chitterlings, fatback, hot dogs, bratwurst, and packaged luncheon meats. Salted nuts and seeds. Canned beans with salt. °Dairy °Whole or 2% milk, cream, half-and-half, and cream cheese. Whole-fat or sweetened yogurt. Full-fat   cheeses or blue cheese. Nondairy creamers and whipped toppings. Processed cheese, cheese spreads, or cheese  curds. °Condiments °Onion and garlic salt, seasoned salt, table salt, and sea salt. Canned and packaged gravies. Worcestershire sauce. Tartar sauce. Barbecue sauce. Teriyaki sauce. Soy sauce, including reduced sodium. Steak sauce. Fish sauce. Oyster sauce. Cocktail sauce. Horseradish. Ketchup and mustard. Meat flavorings and tenderizers. Bouillon cubes. Hot sauce. Tabasco sauce. Marinades. Taco seasonings. Relishes. °Fats and Oils °Butter, stick margarine, lard, shortening, ghee, and bacon fat. Coconut, palm kernel, or palm oils. Regular salad dressings. °Other °Pickles and olives. Salted popcorn and pretzels. °The items listed above may not be a complete list of foods and beverages to avoid. Contact your dietitian for more information. °WHERE CAN I FIND MORE INFORMATION? °National Heart, [employer], and [employer] Institute: www.nhlbi.nih.gov/health/health-topics/topics/dash/ °Document Released: 04/24/2011 Document Revised: 09/19/2013 Document Reviewed: 03/09/2013 °ExitCare® Patient Information ©2015 [employer]. This information is not intended to replace advice given to you by your health care provider. Make sure you discuss any questions you have with your health care provider. ° °

## 2013-12-28 LAB — TSH: TSH: 1.998 u[IU]/mL (ref 0.350–4.500)

## 2013-12-29 ENCOUNTER — Telehealth: Payer: Self-pay | Admitting: Emergency Medicine

## 2013-12-29 NOTE — Telephone Encounter (Signed)
Message copied by Ricci Barker on Thu Dec 29, 2013  3:35 PM ------      Message from: Lorayne Marek      Created: Wed Dec 28, 2013 10:45 AM       Blood work reviewed noticed impaired fasting glucose, call and advise patient for low carbohydrate diet.      also noticed elevated triglycerides, advise patient for low fat diet.       Patient has  renal insufficiency most likely secondary to HTN , will repeat blood chemistry on next visit, if persistently abnormal Bun/Cr then will consider referral to Nephrology. ------

## 2013-12-29 NOTE — Telephone Encounter (Signed)
Pt given lab results with instructions on dieting and exercise control Informed pt we will recheck kidney function with next visit

## 2014-01-03 ENCOUNTER — Telehealth: Payer: Self-pay

## 2014-01-03 NOTE — Telephone Encounter (Signed)
Message copied by Dorothe Pea on Tue Jan 03, 2014 11:17 AM ------      Message from: Lorayne Marek      Created: Wed Dec 28, 2013 10:45 AM       Blood work reviewed noticed impaired fasting glucose, call and advise patient for low carbohydrate diet.      also noticed elevated triglycerides, advise patient for low fat diet.       Patient has  renal insufficiency most likely secondary to HTN , will repeat blood chemistry on next visit, if persistently abnormal Bun/Cr then will consider referral to Nephrology. ------

## 2014-04-26 ENCOUNTER — Other Ambulatory Visit: Payer: Self-pay | Admitting: Gastroenterology

## 2014-04-27 ENCOUNTER — Telehealth: Payer: Self-pay | Admitting: Internal Medicine

## 2014-04-27 NOTE — Telephone Encounter (Signed)
Pharmacy is calling to request a medication refill for Amlodipine 5mg , Coreg 6.25mg , Hydrodiuril 25mg , Lisinopril 30mg , and Pravachol 40mg . Pharmacy stated that [place]. [place] and needs refill before he can be seen. Please f/u with pt.

## 2014-04-28 ENCOUNTER — Telehealth: Payer: Self-pay | Admitting: Emergency Medicine

## 2014-04-28 ENCOUNTER — Other Ambulatory Visit: Payer: Self-pay | Admitting: Emergency Medicine

## 2014-04-28 DIAGNOSIS — I10 Essential (primary) hypertension: Secondary | ICD-10-CM

## 2014-04-28 DIAGNOSIS — E785 Hyperlipidemia, unspecified: Secondary | ICD-10-CM

## 2014-04-28 NOTE — Telephone Encounter (Signed)
Pt called in requesting medication refills on Amlodopine, Coreg,Hydrodiuril,Lisinopril and Pravachol Pt is requesting meds be sent to [facility] in [place] unsure of number;he will call me later

## 2014-05-29 ENCOUNTER — Other Ambulatory Visit: Payer: Self-pay | Admitting: Internal Medicine

## 2014-05-29 DIAGNOSIS — I10 Essential (primary) hypertension: Secondary | ICD-10-CM

## 2014-05-29 MED ORDER — LISINOPRIL 30 MG PO TABS: 30.0000 mg | ORAL_TABLET | Freq: Every day | ORAL | Status: DC

## 2014-05-29 MED ORDER — HYDROCHLOROTHIAZIDE 25 MG PO TABS: 25.0000 mg | ORAL_TABLET | Freq: Every day | ORAL | Status: DC

## 2014-05-29 MED ORDER — CARVEDILOL 6.25 MG PO TABS: 3.1250 mg | ORAL_TABLET | Freq: Two times a day (BID) | ORAL | Status: DC

## 2014-05-29 MED ORDER — AMLODIPINE BESYLATE 5 MG PO TABS: 5.0000 mg | ORAL_TABLET | Freq: Every morning | ORAL | Status: DC

## 2014-05-29 NOTE — Telephone Encounter (Signed)
Rx was refilled, advised to make F/U appointment with PCP for futures refills

## 2014-05-29 NOTE — Telephone Encounter (Signed)
Pharmacy is calling to request a med refills for all of his current medications, pharmacy states that he has been out of his medications for a while. Please f/u with pharmacy.

## 2014-06-20 ENCOUNTER — Ambulatory Visit: Payer: Medicare Other | Admitting: Internal Medicine

## 2014-06-28 ENCOUNTER — Ambulatory Visit: Payer: Medicare Other | Attending: Internal Medicine | Admitting: Internal Medicine

## 2014-06-28 ENCOUNTER — Encounter: Payer: Self-pay | Admitting: Internal Medicine

## 2014-06-28 VITALS — BP 140/80 | HR 71 | Temp 98.2°F | Ht 67.0 in | Wt 185.0 lb

## 2014-06-28 DIAGNOSIS — I252 Old myocardial infarction: Secondary | ICD-10-CM | POA: Diagnosis not present

## 2014-06-28 DIAGNOSIS — Z9889 Other specified postprocedural states: Secondary | ICD-10-CM

## 2014-06-28 DIAGNOSIS — I10 Essential (primary) hypertension: Secondary | ICD-10-CM | POA: Diagnosis not present

## 2014-06-28 DIAGNOSIS — E785 Hyperlipidemia, unspecified: Secondary | ICD-10-CM | POA: Diagnosis not present

## 2014-06-28 DIAGNOSIS — Z87891 Personal history of nicotine dependence: Secondary | ICD-10-CM | POA: Insufficient documentation

## 2014-06-28 DIAGNOSIS — G8929 Other chronic pain: Secondary | ICD-10-CM

## 2014-06-28 DIAGNOSIS — Z791 Long term (current) use of non-steroidal anti-inflammatories (NSAID): Secondary | ICD-10-CM | POA: Diagnosis not present

## 2014-06-28 DIAGNOSIS — Z833 Family history of diabetes mellitus: Secondary | ICD-10-CM

## 2014-06-28 DIAGNOSIS — N289 Disorder of kidney and ureter, unspecified: Secondary | ICD-10-CM | POA: Diagnosis not present

## 2014-06-28 DIAGNOSIS — R7301 Impaired fasting glucose: Secondary | ICD-10-CM | POA: Diagnosis not present

## 2014-06-28 DIAGNOSIS — M545 Low back pain: Secondary | ICD-10-CM

## 2014-06-28 LAB — COMPLETE METABOLIC PANEL WITH GFR
ALBUMIN: 4.6 g/dL (ref 3.5–5.2)
ALT: 34 U/L (ref 0–53)
AST: 27 U/L (ref 0–37)
Alkaline Phosphatase: 66 U/L (ref 39–117)
BUN: 22 mg/dL (ref 6–23)
CHLORIDE: 102 meq/L (ref 96–112)
CO2: 28 meq/L (ref 19–32)
CREATININE: 1.37 mg/dL — AB (ref 0.50–1.35)
Calcium: 11.2 mg/dL — ABNORMAL HIGH (ref 8.4–10.5)
GFR, EST AFRICAN AMERICAN: 64 mL/min
GFR, Est Non African American: 56 mL/min — ABNORMAL LOW
Glucose, Bld: 107 mg/dL — ABNORMAL HIGH (ref 70–99)
POTASSIUM: 4.6 meq/L (ref 3.5–5.3)
Sodium: 141 mEq/L (ref 135–145)
TOTAL PROTEIN: 7.9 g/dL (ref 6.0–8.3)
Total Bilirubin: 0.7 mg/dL (ref 0.2–1.2)

## 2014-06-28 MED ORDER — AMLODIPINE BESYLATE 5 MG PO TABS: 5.0000 mg | ORAL_TABLET | Freq: Every morning | ORAL | Status: DC

## 2014-06-28 MED ORDER — LISINOPRIL 30 MG PO TABS: 30.0000 mg | ORAL_TABLET | Freq: Every day | ORAL | Status: DC

## 2014-06-28 MED ORDER — TRAMADOL HCL 50 MG PO TABS: 50.0000 mg | ORAL_TABLET | Freq: Three times a day (TID) | ORAL | Status: DC | PRN

## 2014-06-28 MED ORDER — GABAPENTIN 300 MG PO CAPS: 300.0000 mg | ORAL_CAPSULE | Freq: Three times a day (TID) | ORAL | Status: DC

## 2014-06-28 MED ORDER — HYDROCHLOROTHIAZIDE 25 MG PO TABS: 25.0000 mg | ORAL_TABLET | Freq: Every day | ORAL | Status: DC

## 2014-06-28 MED ORDER — CARVEDILOL 6.25 MG PO TABS: 3.1250 mg | ORAL_TABLET | Freq: Two times a day (BID) | ORAL | Status: DC

## 2014-06-28 MED ORDER — PRAVASTATIN SODIUM 40 MG PO TABS: 40.0000 mg | ORAL_TABLET | Freq: Every day | ORAL | Status: DC

## 2014-06-28 NOTE — Progress Notes (Signed)
MRN: [id number] Name: Riley Wagner  Sex: male Age: 59 y.o. DOB: 28-Jul-1955  Allergies: Review of patient's allergies indicates no known allergies.  Chief Complaint  Patient presents with  . Follow-up    HPI: Patient is 59 y.o. male who has to of hypertension, hyperlipidemia, history of back surgery, chronic lower back pain, comes today for followup today's blood pressure is borderline elevated, manual blood pressure is 140/80, as per patient he has been taking his medications, he reported that sometimes his blood pressure when he checks at home is 125/80,previous blood work reviewed noticed renal insufficiency, as per patient for the back pain he has been taking naproxen, I have advised patient not to take any NSAIDs, patient has not followed up with orthopedics since the back surgery.  Past Medical History  Diagnosis Date  . Hypertension   . MI (myocardial infarction)     Past Surgical History  Procedure Laterality Date  . Back surgery      2004      Medication List       This list is accurate as of: 06/28/14 11:25 AM.  Always use your most recent med list.               acetaminophen 500 MG tablet  Commonly known as:  TYLENOL  Take 1,500 mg by mouth every 6 (six) hours as needed for pain.     amLODipine 5 MG tablet  Commonly known as:  NORVASC  Take 1 tablet (5 mg total) by mouth every morning. Pt need PCP F/U for future refills     carvedilol 6.25 MG tablet  Commonly known as:  COREG  Take 0.5 tablets (3.125 mg total) by mouth 2 (two) times daily with a meal. Needs office visit for future refills     gabapentin 300 MG capsule  Commonly known as:  NEURONTIN  Take 1 capsule (300 mg total) by mouth 3 (three) times daily.     hydrochlorothiazide 25 MG tablet  Commonly known as:  HYDRODIURIL  Take 1 tablet (25 mg total) by mouth daily.     hydrocortisone valerate ointment 0.2 %  Commonly known as:  WEST-CORT  Apply 1 application topically 2 (two) times  daily. To eyebrows and forehead as needed.     lisinopril 30 MG tablet  Commonly known as:  PRINIVIL,ZESTRIL  Take 1 tablet (30 mg total) by mouth daily.     naproxen 375 MG tablet  Commonly known as:  NAPROSYN  Take 1 tablet (375 mg total) by mouth 2 (two) times daily.     oxyCODONE-acetaminophen 5-325 MG per tablet  Commonly known as:  PERCOCET  Take 2 tablets by mouth every 4 (four) hours as needed for pain.     permethrin 5 % cream  Commonly known as:  ELIMITE  Apply as directed, from neck down, repeat in 1 week.     pravastatin 40 MG tablet  Commonly known as:  PRAVACHOL  Take 1 tablet (40 mg total) by mouth daily.     ranitidine 150 MG tablet  Commonly known as:  ZANTAC  Take 150 mg by mouth daily as needed for heartburn.     traMADol 50 MG tablet  Commonly known as:  ULTRAM  Take 1 tablet (50 mg total) by mouth every 8 (eight) hours as needed for moderate pain.        Meds ordered this encounter  Medications  . amLODipine (NORVASC) 5 MG tablet    Sig: Take  1 tablet (5 mg total) by mouth every morning. Pt need PCP F/U for future refills    Dispense:  30 tablet    Refill:  3  . lisinopril (PRINIVIL,ZESTRIL) 30 MG tablet    Sig: Take 1 tablet (30 mg total) by mouth daily.    Dispense:  30 tablet    Refill:  3    Needs office visit for future refills  . carvedilol (COREG) 6.25 MG tablet    Sig: Take 0.5 tablets (3.125 mg total) by mouth 2 (two) times daily with a meal. Needs office visit for future refills    Dispense:  30 tablet    Refill:  3  . hydrochlorothiazide (HYDRODIURIL) 25 MG tablet    Sig: Take 1 tablet (25 mg total) by mouth daily.    Dispense:  30 tablet    Refill:  3    Needs office visit for future refills  . gabapentin (NEURONTIN) 300 MG capsule    Sig: Take 1 capsule (300 mg total) by mouth 3 (three) times daily.    Dispense:  90 capsule    Refill:  3  . traMADol (ULTRAM) 50 MG tablet    Sig: Take 1 tablet (50 mg total) by mouth every 8  (eight) hours as needed for moderate pain.    Dispense:  30 tablet    Refill:  0  . pravastatin (PRAVACHOL) 40 MG tablet    Sig: Take 1 tablet (40 mg total) by mouth daily.    Dispense:  30 tablet    Refill:  3     There is no immunization history on file for this patient.  Family History  Problem Relation Age of Onset  . Hypertension Mother   . Diabetes Mother   . Stroke Mother     History  Substance Use Topics  . Smoking status: Former Smoker -- 0.20 packs/day for 25 years    Types: Cigarettes    Quit date: 06/21/2014  . Smokeless tobacco: Not on file  . Alcohol Use: 0.0 oz/week    0 Standard drinks or equivalent per week     Comment: occasionally    Review of Systems   As noted in HPI  Filed Vitals:   06/28/14 1118  BP: 140/80  Pulse:   Temp:     Physical Exam  Physical Exam  Constitutional: No distress.  Eyes: EOM are normal. Pupils are equal, round, and reactive to light.  Cardiovascular: Normal rate and regular rhythm.   Pulmonary/Chest: Breath sounds normal. No respiratory distress. He has no wheezes. He has no rales.  Musculoskeletal:  Lower lumbar old mid line surgical scar, some paraspinal tenderness     CBC    Component Value Date/Time   WBC 6.3 12/27/2013 1158   RBC 4.07* 12/27/2013 1158   HGB 12.9* 12/27/2013 1158   HCT 37.9* 12/27/2013 1158   PLT 358 12/27/2013 1158   MCV 93.1 12/27/2013 1158   LYMPHSABS 1.5 12/27/2013 1158   MONOABS 0.6 12/27/2013 1158   EOSABS 0.7 12/27/2013 1158   BASOSABS 0.1 12/27/2013 1158    CMP     Component Value Date/Time   NA 140 12/27/2013 1158   K 4.2 12/27/2013 1158   CL 108 12/27/2013 1158   CO2 25 12/27/2013 1158   GLUCOSE 114* 12/27/2013 1158   BUN 27* 12/27/2013 1158   CREATININE 1.59* 12/27/2013 1158   CALCIUM 10.5 12/27/2013 1158   PROT 7.6 12/27/2013 1158   ALBUMIN 4.9 12/27/2013 1158  AST 22 12/27/2013 1158   ALT 21 12/27/2013 1158   ALKPHOS 45 12/27/2013 1158   BILITOT 0.5  12/27/2013 1158   GFRNONAA 46* 12/27/2013 1158   GFRAA 54* 12/27/2013 1158    Lab Results  Component Value Date/Time   CHOL 142 12/27/2013 11:58 AM    No components found for: HGA1C  Lab Results  Component Value Date/Time   AST 22 12/27/2013 11:58 AM    Assessment and Plan  Essential hypertension - Plan: amLODipine (NORVASC) 5 MG tablet, lisinopril (PRINIVIL,ZESTRIL) 30 MG tablet, carvedilol (COREG) 6.25 MG tablet, hydrochlorothiazide (HYDRODIURIL) 25 MG tablet, COMPLETE METABOLIC PANEL WITH GFR  Hyperlipidemia - Plan: continue with pravastatin (PRAVACHOL) 40 MG tablet, we'll check fasting lipid panel on the next visit.  IFG (impaired fasting glucose) Advise patient for low carbohydrate diet.  Family history of diabetes mellitus (DM) - Plan: COMPLETE METABOLIC PANEL WITH GFR  History of back surgery - Plan: Ambulatory referral to Orthopedic Surgery  Chronic lower back pain - Plan: gabapentin (NEURONTIN) 300 MG capsule, traMADol (ULTRAM) 50 MG tablet, Ambulatory referral to Orthopedic Surgery  Renal insufficiency - Plan: I have advised patient not to take any NSAIDs, will repeat blood chemistry COMPLETE METABOLIC PANEL WITH GFR   Health Maintenance -Colonoscopy: patient is scheduled  - -Vaccinations: Patient declines flu shot   Return in about 3 months (around 09/26/2014) for hypertension.  Lorayne Marek, MD

## 2014-06-28 NOTE — Progress Notes (Signed)
Patient is here for follow-up of HTN.  Pt states he has taken all his medications this morning. He had a cup of unsweet tea for breakfast this morning.  Pt would like for Dr. Annitta Needs to review the xray that was done of his back on 07-2012.

## 2014-06-28 NOTE — Patient Instructions (Addendum)
DASH Eating Plan DASH stands for "Dietary Approaches to Stop Hypertension." The DASH eating plan is a healthy eating plan that has been shown to reduce high blood pressure (hypertension). Additional health benefits may include reducing the risk of type 2 diabetes mellitus, heart disease, and stroke. The DASH eating plan may also help with weight loss. WHAT DO I NEED TO KNOW ABOUT THE DASH EATING PLAN? For the DASH eating plan, you will follow these general guidelines:  Choose foods with a percent daily value for sodium of less than 5% (as listed on the food label).  Use salt-free seasonings or herbs instead of table salt or sea salt.  Check with your health care provider or pharmacist before using salt substitutes.  Eat lower-sodium products, often labeled as "lower sodium" or "no salt added."  Eat fresh foods.  Eat more vegetables, fruits, and low-fat dairy products.  Choose whole grains. Look for the word "whole" as the first word in the ingredient list.  Choose fish and skinless chicken or turkey more often than red meat. Limit fish, poultry, and meat to 6 oz (170 g) each day.  Limit sweets, desserts, sugars, and sugary drinks.  Choose heart-healthy fats.  Limit cheese to 1 oz (28 g) per day.  Eat more home-cooked food and less restaurant, buffet, and fast food.  Limit fried foods.  Cook foods using methods other than frying.  Limit canned vegetables. If you do use them, rinse them well to decrease the sodium.  When eating at a restaurant, ask that your food be prepared with less salt, or no salt if possible. WHAT FOODS CAN I EAT? Seek help from a dietitian for individual calorie needs. Grains Whole grain or whole wheat bread. Brown rice. Whole grain or whole wheat pasta. Quinoa, bulgur, and whole grain cereals. Low-sodium cereals. Corn or whole wheat flour tortillas. Whole grain cornbread. Whole grain crackers. Low-sodium crackers. Vegetables Fresh or frozen vegetables  (raw, steamed, roasted, or grilled). Low-sodium or reduced-sodium tomato and vegetable juices. Low-sodium or reduced-sodium tomato sauce and paste. Low-sodium or reduced-sodium canned vegetables.  Fruits All fresh, canned (in natural juice), or frozen fruits. Meat and Other Protein Products Ground beef (85% or leaner), grass-fed beef, or beef trimmed of fat. Skinless chicken or turkey. Ground chicken or turkey. Pork trimmed of fat. All fish and seafood. Eggs. Dried beans, peas, or lentils. Unsalted nuts and seeds. Unsalted canned beans. Dairy Low-fat dairy products, such as skim or 1% milk, 2% or reduced-fat cheeses, low-fat ricotta or cottage cheese, or plain low-fat yogurt. Low-sodium or reduced-sodium cheeses. Fats and Oils Tub margarines without trans fats. Light or reduced-fat mayonnaise and salad dressings (reduced sodium). Avocado. Safflower, olive, or canola oils. Natural peanut or almond butter. Other Unsalted popcorn and pretzels. The items listed above may not be a complete list of recommended foods or beverages. Contact your dietitian for more options. WHAT FOODS ARE NOT RECOMMENDED? Grains White bread. White pasta. White rice. Refined cornbread. Bagels and croissants. Crackers that contain trans fat. Vegetables Creamed or fried vegetables. Vegetables in a cheese sauce. Regular canned vegetables. Regular canned tomato sauce and paste. Regular tomato and vegetable juices. Fruits Dried fruits. Canned fruit in light or heavy syrup. Fruit juice. Meat and Other Protein Products Fatty cuts of meat. Ribs, chicken wings, bacon, sausage, bologna, salami, chitterlings, fatback, hot dogs, bratwurst, and packaged luncheon meats. Salted nuts and seeds. Canned beans with salt. Dairy Whole or 2% milk, cream, half-and-half, and cream cheese. Whole-fat or sweetened yogurt. Full-fat   cheeses or blue cheese. Nondairy creamers and whipped toppings. Processed cheese, cheese spreads, or cheese  curds. Condiments Onion and garlic salt, seasoned salt, table salt, and sea salt. Canned and packaged gravies. Worcestershire sauce. Tartar sauce. Barbecue sauce. Teriyaki sauce. Soy sauce, including reduced sodium. Steak sauce. Fish sauce. Oyster sauce. Cocktail sauce. Horseradish. Ketchup and mustard. Meat flavorings and tenderizers. Bouillon cubes. Hot sauce. Tabasco sauce. Marinades. Taco seasonings. Relishes. Fats and Oils Butter, stick margarine, lard, shortening, ghee, and bacon fat. Coconut, palm kernel, or palm oils. Regular salad dressings. Other Pickles and olives. Salted popcorn and pretzels. The items listed above may not be a complete list of foods and beverages to avoid. Contact your dietitian for more information. WHERE CAN I FIND MORE INFORMATION? National Heart, Lung, and [employer] Institute: www.nhlbi.nih.gov/health/health-topics/topics/dash/ Document Released: 04/24/2011 Document Revised: 09/19/2013 Document Reviewed: 03/09/2013 ExitCare Patient Information 2015 [employer]. This information is not intended to replace advice given to you by your health care provider. Make sure you discuss any questions you have with your health care provider. Diabetes Mellitus and Food It is important for you to manage your blood sugar (glucose) level. Your blood glucose level can be greatly affected by what you eat. Eating healthier foods in the appropriate amounts throughout the day at about the same time each day will help you control your blood glucose level. It can also help slow or prevent worsening of your diabetes mellitus. Healthy eating may even help you improve the level of your blood pressure and reach or maintain a healthy weight.  HOW CAN FOOD AFFECT ME? Carbohydrates Carbohydrates affect your blood glucose level more than any other type of food. Your dietitian will help you determine how many carbohydrates to eat at each meal and teach you how to count carbohydrates. Counting  carbohydrates is important to keep your blood glucose at a healthy level, especially if you are using insulin or taking certain medicines for diabetes mellitus. Alcohol Alcohol can cause sudden decreases in blood glucose (hypoglycemia), especially if you use insulin or take certain medicines for diabetes mellitus. Hypoglycemia can be a life-threatening condition. Symptoms of hypoglycemia (sleepiness, dizziness, and disorientation) are similar to symptoms of having too much alcohol.  If your health care provider has given you approval to drink alcohol, do so in moderation and use the following guidelines:  Women should not have more than one drink per day, and men should not have more than two drinks per day. One drink is equal to:  12 oz of beer.  5 oz of wine.  1 oz of hard liquor.  Do not drink on an empty stomach.  Keep yourself hydrated. Have water, diet soda, or unsweetened iced tea.  Regular soda, juice, and other mixers might contain a lot of carbohydrates and should be counted. WHAT FOODS ARE NOT RECOMMENDED? As you make food choices, it is important to remember that all foods are not the same. Some foods have fewer nutrients per serving than other foods, even though they might have the same number of calories or carbohydrates. It is difficult to get your body what it needs when you eat foods with fewer nutrients. Examples of foods that you should avoid that are high in calories and carbohydrates but low in nutrients include:  Trans fats (most processed foods list trans fats on the Nutrition Facts label).  Regular soda.  Juice.  Candy.  Sweets, such as cake, pie, doughnuts, and cookies.  Fried foods. WHAT FOODS CAN I EAT? Have nutrient-rich foods,   which will nourish your body and keep you healthy. The food you should eat also will depend on several factors, including:  The calories you need.  The medicines you take.  Your weight.  Your blood glucose level.  Your  blood pressure level.  Your cholesterol level. You also should eat a variety of foods, including:  Protein, such as meat, poultry, fish, tofu, nuts, and seeds (lean animal proteins are best).  Fruits.  Vegetables.  Dairy products, such as milk, cheese, and yogurt (low fat is best).  Breads, grains, pasta, cereal, rice, and beans.  Fats such as olive oil, trans fat-free margarine, canola oil, avocado, and olives. DOES EVERYONE WITH DIABETES MELLITUS HAVE THE SAME MEAL PLAN? Because every person with diabetes mellitus is different, there is not one meal plan that works for everyone. It is very important that you meet with a dietitian who will help you create a meal plan that is just right for you. Document Released: 01/30/2005 Document Revised: 05/10/2013 Document Reviewed: 04/01/2013 ExitCare Patient Information 2015 [employer]. This information is not intended to replace advice given to you by your health care provider. Make sure you discuss any questions you have with your health care provider.  

## 2014-06-29 ENCOUNTER — Telehealth: Payer: Self-pay

## 2014-06-29 NOTE — Telephone Encounter (Signed)
Patient is aware of his lab results 

## 2014-06-29 NOTE — Telephone Encounter (Signed)
-----   Message from Lorayne Marek, MD sent at 06/29/2014  9:31 AM EST ----- Call and let the patient know that his blood work shows improvement in his renal function, advise patient to avoid NSAIDs, also noticed his calcium level is borderline elevated, will repeat blood chemistry on the following visit.and will PTH level

## 2014-07-07 ENCOUNTER — Other Ambulatory Visit: Payer: Self-pay | Admitting: Gastroenterology

## 2014-07-14 NOTE — Progress Notes (Signed)
RN lefr message for patient to return call on 2019-16, 07-11-14, 07-12-14, 07-13-13, patient never returned RN call

## 2014-07-18 ENCOUNTER — Ambulatory Visit (HOSPITAL_COMMUNITY): Admission: RE | Admit: 2014-07-18 | Payer: Medicare Other | Source: Ambulatory Visit | Admitting: Gastroenterology

## 2014-07-18 ENCOUNTER — Encounter (HOSPITAL_COMMUNITY): Payer: Self-pay | Admitting: Anesthesiology

## 2014-07-18 ENCOUNTER — Encounter (HOSPITAL_COMMUNITY): Admission: RE | Payer: Self-pay | Source: Ambulatory Visit

## 2014-07-18 SURGERY — COLONOSCOPY WITH PROPOFOL
Anesthesia: Monitor Anesthesia Care

## 2014-07-18 NOTE — Anesthesia Preprocedure Evaluation (Deleted)
Anesthesia Evaluation  Patient identified by MRN, date of birth, ID band Patient awake    Reviewed: Allergy & Precautions, NPO status , Patient's Chart, lab work & pertinent test results  Airway        Dental   Pulmonary former smoker,          Cardiovascular hypertension, + CAD and + Past MI     Neuro/Psych    GI/Hepatic   Endo/Other    Renal/GU      Musculoskeletal   Abdominal   Peds  Hematology   Anesthesia Other Findings   Reproductive/Obstetrics                            Anesthesia Physical Anesthesia Plan  ASA: III  Anesthesia Plan: MAC   Post-op Pain Management:    Induction: Intravenous  Airway Management Planned: Simple Face Mask  Additional Equipment:   Intra-op Plan:   Post-operative Plan:   Informed Consent: I have reviewed the patients History and Physical, chart, labs and discussed the procedure including the risks, benefits and alternatives for the proposed anesthesia with the patient or authorized representative who has indicated his/her understanding and acceptance.     Plan Discussed with: CRNA, Anesthesiologist and Surgeon  Anesthesia Plan Comments:        Anesthesia Quick Evaluation

## 2014-10-05 ENCOUNTER — Emergency Department (HOSPITAL_COMMUNITY): Payer: Medicare Other

## 2014-10-05 ENCOUNTER — Encounter (HOSPITAL_COMMUNITY): Payer: Self-pay | Admitting: Physical Medicine and Rehabilitation

## 2014-10-05 ENCOUNTER — Emergency Department (HOSPITAL_COMMUNITY)
Admission: EM | Admit: 2014-10-05 | Discharge: 2014-10-05 | Disposition: A | Discharge status: Home / Self Care | Payer: Medicare Other | Attending: Emergency Medicine | Admitting: Emergency Medicine

## 2014-10-05 DIAGNOSIS — Z87891 Personal history of nicotine dependence: Secondary | ICD-10-CM | POA: Insufficient documentation

## 2014-10-05 DIAGNOSIS — Y939 Activity, unspecified: Secondary | ICD-10-CM | POA: Diagnosis not present

## 2014-10-05 DIAGNOSIS — Y92410 Unspecified street and highway as the place of occurrence of the external cause: Secondary | ICD-10-CM | POA: Insufficient documentation

## 2014-10-05 DIAGNOSIS — I252 Old myocardial infarction: Secondary | ICD-10-CM | POA: Insufficient documentation

## 2014-10-05 DIAGNOSIS — G8929 Other chronic pain: Secondary | ICD-10-CM | POA: Insufficient documentation

## 2014-10-05 DIAGNOSIS — R200 Anesthesia of skin: Secondary | ICD-10-CM | POA: Insufficient documentation

## 2014-10-05 DIAGNOSIS — M5442 Lumbago with sciatica, left side: Secondary | ICD-10-CM | POA: Diagnosis not present

## 2014-10-05 DIAGNOSIS — S3992XA Unspecified injury of lower back, initial encounter: Secondary | ICD-10-CM | POA: Diagnosis present

## 2014-10-05 DIAGNOSIS — Z79899 Other long term (current) drug therapy: Secondary | ICD-10-CM | POA: Insufficient documentation

## 2014-10-05 DIAGNOSIS — Y999 Unspecified external cause status: Secondary | ICD-10-CM | POA: Diagnosis not present

## 2014-10-05 DIAGNOSIS — I10 Essential (primary) hypertension: Secondary | ICD-10-CM | POA: Diagnosis not present

## 2014-10-05 DIAGNOSIS — M545 Low back pain: Secondary | ICD-10-CM | POA: Diagnosis not present

## 2014-10-05 DIAGNOSIS — S199XXA Unspecified injury of neck, initial encounter: Secondary | ICD-10-CM | POA: Insufficient documentation

## 2014-10-05 MED ORDER — CYCLOBENZAPRINE HCL 5 MG PO TABS: 5.0000 mg | ORAL_TABLET | Freq: Three times a day (TID) | ORAL | Status: DC | PRN

## 2014-10-05 MED ORDER — HYDROCODONE-ACETAMINOPHEN 5-325 MG PO TABS
1.0000 | ORAL_TABLET | Freq: Once | ORAL | Status: AC
  Administered 2014-10-05: 1 via ORAL
  Filled 2014-10-05: qty 1

## 2014-10-05 MED ORDER — HYDROCODONE-ACETAMINOPHEN 5-325 MG PO TABS: 1.0000 | ORAL_TABLET | Freq: Four times a day (QID) | ORAL | Status: DC | PRN
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
## 2014-10-05 NOTE — ED Notes (Signed)
NP Hope at the bedside.

## 2014-10-05 NOTE — ED Notes (Signed)
Patient returned from X-ray 

## 2014-11-28 ENCOUNTER — Encounter: Payer: Self-pay | Admitting: Internal Medicine

## 2014-11-28 ENCOUNTER — Ambulatory Visit: Payer: Medicare Other | Attending: Internal Medicine | Admitting: Internal Medicine

## 2014-11-28 VITALS — BP 160/90 | HR 80 | Temp 98.0°F | Resp 16 | Wt 186.0 lb

## 2014-11-28 DIAGNOSIS — R030 Elevated blood-pressure reading, without diagnosis of hypertension: Secondary | ICD-10-CM | POA: Diagnosis not present

## 2014-11-28 DIAGNOSIS — M545 Low back pain, unspecified: Secondary | ICD-10-CM

## 2014-11-28 DIAGNOSIS — I10 Essential (primary) hypertension: Secondary | ICD-10-CM | POA: Diagnosis not present

## 2014-11-28 DIAGNOSIS — G8929 Other chronic pain: Secondary | ICD-10-CM | POA: Insufficient documentation

## 2014-11-28 DIAGNOSIS — Z79899 Other long term (current) drug therapy: Secondary | ICD-10-CM | POA: Diagnosis not present

## 2014-11-28 DIAGNOSIS — Z87891 Personal history of nicotine dependence: Secondary | ICD-10-CM | POA: Insufficient documentation

## 2014-11-28 DIAGNOSIS — IMO0001 Reserved for inherently not codable concepts without codable children: Secondary | ICD-10-CM

## 2014-11-28 MED ORDER — GABAPENTIN 300 MG PO CAPS: 300.0000 mg | ORAL_CAPSULE | Freq: Three times a day (TID) | ORAL | Status: DC

## 2014-11-28 MED ORDER — AMLODIPINE BESYLATE 5 MG PO TABS: 5.0000 mg | ORAL_TABLET | Freq: Every morning | ORAL | Status: DC

## 2014-11-28 MED ORDER — LISINOPRIL 30 MG PO TABS: 30.0000 mg | ORAL_TABLET | Freq: Every day | ORAL | Status: DC

## 2014-11-28 MED ORDER — CLONIDINE HCL 0.1 MG PO TABS
0.2000 mg | ORAL_TABLET | Freq: Once | ORAL | Status: AC
Start: 2014-11-28 — End: 2014-11-28
  Administered 2014-11-28: 0.2 mg via ORAL

## 2014-11-28 MED ORDER — HYDROCHLOROTHIAZIDE 25 MG PO TABS: 25.0000 mg | ORAL_TABLET | Freq: Every day | ORAL | Status: DC

## 2014-11-28 MED ORDER — CARVEDILOL 6.25 MG PO TABS: 3.1250 mg | ORAL_TABLET | Freq: Two times a day (BID) | ORAL | Status: DC

## 2014-11-28 MED ORDER — TRAMADOL HCL 50 MG PO TABS: 50.0000 mg | ORAL_TABLET | Freq: Three times a day (TID) | ORAL | Status: DC | PRN
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
## 2014-11-28 NOTE — Patient Instructions (Signed)
DASH Eating Plan °DASH stands for "Dietary Approaches to Stop Hypertension." The DASH eating plan is a healthy eating plan that has been shown to reduce high blood pressure (hypertension). Additional health benefits may include reducing the risk of type 2 diabetes mellitus, heart disease, and stroke. The DASH eating plan may also help with weight loss. °WHAT DO I NEED TO KNOW ABOUT THE DASH EATING PLAN? °For the DASH eating plan, you will follow these general guidelines: °· Choose foods with a percent daily value for sodium of less than 5% (as listed on the food label). °· Use salt-free seasonings or herbs instead of table salt or sea salt. °· Check with your health care provider or pharmacist before using salt substitutes. °· Eat lower-sodium products, often labeled as "lower sodium" or "no salt added." °· Eat fresh foods. °· Eat more vegetables, fruits, and low-fat dairy products. °· Choose whole grains. Look for the word "whole" as the first word in the ingredient list. °· Choose fish and skinless chicken or turkey more often than red meat. Limit fish, poultry, and meat to 6 oz (170 g) each day. °· Limit sweets, desserts, sugars, and sugary drinks. °· Choose heart-healthy fats. °· Limit cheese to 1 oz (28 g) per day. °· Eat more home-cooked food and less restaurant, buffet, and fast food. °· Limit fried foods. °· Cook foods using methods other than frying. °· Limit canned vegetables. If you do use them, rinse them well to decrease the sodium. °· When eating at a restaurant, ask that your food be prepared with less salt, or no salt if possible. °WHAT FOODS CAN I EAT? °Seek help from a dietitian for individual calorie needs. °Grains °Whole grain or whole wheat bread. Brown rice. Whole grain or whole wheat pasta. Quinoa, bulgur, and whole grain cereals. Low-sodium cereals. Corn or whole wheat flour tortillas. Whole grain cornbread. Whole grain crackers. Low-sodium crackers. °Vegetables °Fresh or frozen vegetables  (raw, steamed, roasted, or grilled). Low-sodium or reduced-sodium tomato and vegetable juices. Low-sodium or reduced-sodium tomato sauce and paste. Low-sodium or reduced-sodium canned vegetables.  °Fruits °All fresh, canned (in natural juice), or frozen fruits. °Meat and Other Protein Products °Ground beef (85% or leaner), grass-fed beef, or beef trimmed of fat. Skinless chicken or turkey. Ground chicken or turkey. Pork trimmed of fat. All fish and seafood. Eggs. Dried beans, peas, or lentils. Unsalted nuts and seeds. Unsalted canned beans. °Dairy °Low-fat dairy products, such as skim or 1% milk, 2% or reduced-fat cheeses, low-fat ricotta or cottage cheese, or plain low-fat yogurt. Low-sodium or reduced-sodium cheeses. °Fats and Oils °Tub margarines without trans fats. Light or reduced-fat mayonnaise and salad dressings (reduced sodium). Avocado. Safflower, olive, or canola oils. Natural peanut or almond butter. °Other °Unsalted popcorn and pretzels. °The items listed above may not be a complete list of recommended foods or beverages. Contact your dietitian for more options. °WHAT FOODS ARE NOT RECOMMENDED? °Grains °White bread. White pasta. White rice. Refined cornbread. Bagels and croissants. Crackers that contain trans fat. °Vegetables °Creamed or fried vegetables. Vegetables in a cheese sauce. Regular canned vegetables. Regular canned tomato sauce and paste. Regular tomato and vegetable juices. °Fruits °Dried fruits. Canned fruit in light or heavy syrup. Fruit juice. °Meat and Other Protein Products °Fatty cuts of meat. Ribs, chicken wings, bacon, sausage, bologna, salami, chitterlings, fatback, hot dogs, bratwurst, and packaged luncheon meats. Salted nuts and seeds. Canned beans with salt. °Dairy °Whole or 2% milk, cream, half-and-half, and cream cheese. Whole-fat or sweetened yogurt. Full-fat   cheeses or blue cheese. Nondairy creamers and whipped toppings. Processed cheese, cheese spreads, or cheese  curds. °Condiments °Onion and garlic salt, seasoned salt, table salt, and sea salt. Canned and packaged gravies. Worcestershire sauce. Tartar sauce. Barbecue sauce. Teriyaki sauce. Soy sauce, including reduced sodium. Steak sauce. Fish sauce. Oyster sauce. Cocktail sauce. Horseradish. Ketchup and mustard. Meat flavorings and tenderizers. Bouillon cubes. Hot sauce. Tabasco sauce. Marinades. Taco seasonings. Relishes. °Fats and Oils °Butter, stick margarine, lard, shortening, ghee, and bacon fat. Coconut, palm kernel, or palm oils. Regular salad dressings. °Other °Pickles and olives. Salted popcorn and pretzels. °The items listed above may not be a complete list of foods and beverages to avoid. Contact your dietitian for more information. °WHERE CAN I FIND MORE INFORMATION? °National Heart, [employer], and [employer] Institute: www.nhlbi.nih.gov/health/health-topics/topics/dash/ °Document Released: 04/24/2011 Document Revised: 09/19/2013 Document Reviewed: 03/09/2013 °ExitCare® Patient Information ©2015 [employer]. This information is not intended to replace advice given to you by your health care provider. Make sure you discuss any questions you have with your health care provider. ° °
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
## 2014-12-22 ENCOUNTER — Ambulatory Visit: Payer: Medicare Other | Attending: Internal Medicine | Admitting: Pharmacist

## 2014-12-22 ENCOUNTER — Encounter: Payer: Self-pay | Admitting: Internal Medicine

## 2014-12-22 ENCOUNTER — Encounter: Payer: Self-pay | Admitting: Pharmacist

## 2014-12-22 ENCOUNTER — Ambulatory Visit (HOSPITAL_BASED_OUTPATIENT_CLINIC_OR_DEPARTMENT_OTHER): Payer: Medicare Other | Admitting: Internal Medicine

## 2014-12-22 VITALS — BP 152/90 | HR 57 | Temp 98.1°F | Resp 18 | Ht 67.0 in | Wt 185.6 lb

## 2014-12-22 VITALS — BP 142/101 | HR 72

## 2014-12-22 DIAGNOSIS — Z9114 Patient's other noncompliance with medication regimen: Secondary | ICD-10-CM

## 2014-12-22 DIAGNOSIS — IMO0001 Reserved for inherently not codable concepts without codable children: Secondary | ICD-10-CM

## 2014-12-22 DIAGNOSIS — G8929 Other chronic pain: Secondary | ICD-10-CM | POA: Diagnosis not present

## 2014-12-22 DIAGNOSIS — M545 Low back pain, unspecified: Secondary | ICD-10-CM

## 2014-12-22 DIAGNOSIS — R030 Elevated blood-pressure reading, without diagnosis of hypertension: Secondary | ICD-10-CM

## 2014-12-22 MED ORDER — TRAMADOL HCL 50 MG PO TABS: 50.0000 mg | ORAL_TABLET | Freq: Three times a day (TID) | ORAL | Status: DC | PRN

## 2014-12-22 MED ORDER — CYCLOBENZAPRINE HCL 5 MG PO TABS: 5.0000 mg | ORAL_TABLET | Freq: Three times a day (TID) | ORAL | Status: DC | PRN

## 2014-12-22 NOTE — Patient Instructions (Signed)
It was nice to see you today.  Please are blood pressure
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
## 2014-12-22 NOTE — Progress Notes (Signed)
Patient case was discussed with Chari Manning. Will have patient scheduled as a walk-in for today so that she can follow up on pain and impact of acetaminophen ingestion of doses up 12g daily x 2-3 months.   Nicoletta Ba, PharmD, BCPS Clinical Pharmacist

## 2014-12-23 LAB — COMPLETE METABOLIC PANEL WITH GFR
ALT: 31 U/L (ref 9–46)
AST: 24 U/L (ref 10–35)
Albumin: 4.9 g/dL (ref 3.6–5.1)
Alkaline Phosphatase: 74 U/L (ref 40–115)
BUN: 21 mg/dL (ref 7–25)
CO2: 26 mmol/L (ref 20–31)
Calcium: 10.6 mg/dL — ABNORMAL HIGH (ref 8.6–10.3)
Chloride: 99 mmol/L (ref 98–110)
Creat: 1.51 mg/dL — ABNORMAL HIGH (ref 0.70–1.25)
GFR, EST NON AFRICAN AMERICAN: 49 mL/min — AB (ref >=60)
GFR, Est African American: 57 mL/min — ABNORMAL LOW (ref >=60)
Glucose, Bld: 110 mg/dL — ABNORMAL HIGH (ref 65–99)
Potassium: 4.8 mmol/L (ref 3.5–5.3)
Sodium: 140 mmol/L (ref 135–146)
Total Bilirubin: 0.6 mg/dL (ref 0.2–1.2)
Total Protein: 8.1 g/dL (ref 6.1–8.1)

## 2014-12-23 LAB — HEPATIC FUNCTION PANEL
ALBUMIN: 4.9 g/dL (ref 3.6–5.1)
ALT: 31 U/L (ref 9–46)
AST: 24 U/L (ref 10–35)
Alkaline Phosphatase: 74 U/L (ref 40–115)
BILIRUBIN DIRECT: 0.1 mg/dL (ref <=0.2)
Indirect Bilirubin: 0.5 mg/dL (ref 0.2–1.2)
TOTAL PROTEIN: 8.1 g/dL (ref 6.1–8.1)
Total Bilirubin: 0.6 mg/dL (ref 0.2–1.2)

## 2014-12-26 LAB — ACETAMINOPHEN LEVEL

## 2015-01-02 ENCOUNTER — Telehealth: Payer: Self-pay

## 2015-01-02 NOTE — Telephone Encounter (Signed)
-----   Message from Lance Bosch, NP sent at 12/26/2014  6:18 PM EDT ----- Liver is fine but kidney function has worsened over last year. Please explain that he needs to avoid all NSAIDs and give examples.

## 2015-01-02 NOTE — Telephone Encounter (Signed)
Nurse called patient, patient verified date of birth. Patient aware of liver function is fine but kidney function has worsened over last year.  Patient aware to avoid NSAIDs including ibuprofen, aleve/naproxen, aspirin, voltaren and indocin.  Patient voices understanding and has no further questions at this time.

## 2015-01-02 NOTE — Telephone Encounter (Signed)
See previous note

## 2015-04-25 ENCOUNTER — Telehealth: Payer: Self-pay | Admitting: Internal Medicine

## 2015-04-25 DIAGNOSIS — I10 Essential (primary) hypertension: Secondary | ICD-10-CM

## 2015-04-25 NOTE — Telephone Encounter (Signed)
carvedilol (COREG) 6.25 MG tablet

## 2015-04-25 NOTE — Telephone Encounter (Signed)
lisinopril (PRINIVIL,ZESTRIL) 30 MG tablet ° °amLODipine (NORVASC) 5 MG tablet ° ° °

## 2015-04-26 MED ORDER — CARVEDILOL 6.25 MG PO TABS: 3.1250 mg | ORAL_TABLET | Freq: Two times a day (BID) | ORAL | Status: DC

## 2015-05-02 ENCOUNTER — Ambulatory Visit: Payer: Medicare Other | Admitting: Internal Medicine

## 2015-05-03 ENCOUNTER — Telehealth: Payer: Self-pay | Admitting: Internal Medicine

## 2015-05-03 ENCOUNTER — Other Ambulatory Visit: Payer: Self-pay

## 2015-05-03 DIAGNOSIS — I10 Essential (primary) hypertension: Secondary | ICD-10-CM

## 2015-05-03 MED ORDER — AMLODIPINE BESYLATE 5 MG PO TABS: 5.0000 mg | ORAL_TABLET | Freq: Every morning | ORAL | Status: DC

## 2015-05-03 MED ORDER — LISINOPRIL 30 MG PO TABS: 30.0000 mg | ORAL_TABLET | Freq: Every day | ORAL | Status: DC

## 2015-05-03 NOTE — Telephone Encounter (Signed)
Patient called and requested a med refill for the following medications:  lisinopril (PRINIVIL,ZESTRIL) 30 MG tablet amLODipine (NORVASC) 5 MG tablet   Please f/u

## 2015-05-03 NOTE — Telephone Encounter (Signed)
Nurse called patient, reached voicemail. Left message for patient to call Amauri Keefe with [facility], at [phone]. Nurse called patient to make him aware of amlodipine and lisinopril being sent to pharmacy.

## 2015-05-03 NOTE — Telephone Encounter (Signed)
Patient called and requested a med refill for  Amlodipine and Lisinopril, please f/u

## 2015-12-17 ENCOUNTER — Other Ambulatory Visit: Payer: Self-pay | Admitting: Internal Medicine

## 2015-12-17 DIAGNOSIS — I10 Essential (primary) hypertension: Secondary | ICD-10-CM

## 2016-02-18 ENCOUNTER — Telehealth: Payer: Self-pay | Admitting: Internal Medicine

## 2016-02-18 DIAGNOSIS — I10 Essential (primary) hypertension: Secondary | ICD-10-CM

## 2016-02-18 MED ORDER — LISINOPRIL 30 MG PO TABS: 30.0000 mg | ORAL_TABLET | Freq: Every day | ORAL | 0 refills | Status: DC

## 2016-02-18 MED ORDER — AMLODIPINE BESYLATE 5 MG PO TABS
5.0000 mg | ORAL_TABLET | Freq: Every morning | ORAL | 0 refills | Status: DC
Start: 2016-02-18 — End: 2016-08-13

## 2016-02-18 MED ORDER — CARVEDILOL 6.25 MG PO TABS: 3.1250 mg | ORAL_TABLET | Freq: Two times a day (BID) | ORAL | 0 refills | Status: DC

## 2016-02-18 NOTE — Telephone Encounter (Signed)
Medication Refill: amLODipine (NORVASC) 5 MG tablet  carvedilol (COREG) 6.25 MG tablet [id number]  lisinopril (PRINIVIL,ZESTRIL) 30 MG tablet [id number]  Pt was instructed to call on 10/9 to schedule an appointment to re-establish care with a new PCP

## 2016-02-18 NOTE — Telephone Encounter (Signed)
Requested medications refilled x 30 days - patient must have office visit for any further refills. 

## 2016-08-13 ENCOUNTER — Ambulatory Visit: Payer: Medicare Other | Attending: Family Medicine | Admitting: Family Medicine

## 2016-08-13 VITALS — BP 161/93 | HR 76 | Temp 98.4°F | Resp 18 | Ht 64.0 in | Wt 197.2 lb

## 2016-08-13 DIAGNOSIS — Z9114 Patient's other noncompliance with medication regimen: Secondary | ICD-10-CM | POA: Diagnosis not present

## 2016-08-13 DIAGNOSIS — M545 Low back pain: Secondary | ICD-10-CM

## 2016-08-13 DIAGNOSIS — Z9189 Other specified personal risk factors, not elsewhere classified: Secondary | ICD-10-CM | POA: Diagnosis not present

## 2016-08-13 DIAGNOSIS — G8929 Other chronic pain: Secondary | ICD-10-CM | POA: Diagnosis not present

## 2016-08-13 DIAGNOSIS — I10 Essential (primary) hypertension: Secondary | ICD-10-CM

## 2016-08-13 DIAGNOSIS — Z79899 Other long term (current) drug therapy: Secondary | ICD-10-CM | POA: Diagnosis not present

## 2016-08-13 MED ORDER — LISINOPRIL 30 MG PO TABS: 30.0000 mg | ORAL_TABLET | Freq: Every day | ORAL | 2 refills | Status: DC

## 2016-08-13 MED ORDER — ACETAMINOPHEN 500 MG PO TABS: 1000.0000 mg | ORAL_TABLET | Freq: Four times a day (QID) | ORAL | 0 refills | Status: DC | PRN

## 2016-08-13 MED ORDER — CARVEDILOL 6.25 MG PO TABS: 3.1250 mg | ORAL_TABLET | Freq: Two times a day (BID) | ORAL | 2 refills | Status: DC

## 2016-08-13 MED ORDER — CLONIDINE HCL 0.2 MG PO TABS
0.2000 mg | ORAL_TABLET | Freq: Once | ORAL | Status: AC
  Administered 2016-08-13: 0.2 mg via ORAL

## 2016-08-13 MED ORDER — HYDROCHLOROTHIAZIDE 25 MG PO TABS: 25.0000 mg | ORAL_TABLET | Freq: Every day | ORAL | 2 refills | Status: DC

## 2016-08-13 MED ORDER — AMLODIPINE BESYLATE 5 MG PO TABS: 5.0000 mg | ORAL_TABLET | Freq: Every morning | ORAL | 2 refills | Status: DC

## 2016-08-13 MED FILL — AMLODIPINE BESYLATE 5 MG TA: 5 | 30 days supply | Qty: 30 | Fill #0

## 2016-08-13 MED FILL — HYDROCHLOROTHIAZIDE 25 MG T: 25 | 30 days supply | Qty: 30 | Fill #0

## 2016-08-13 MED FILL — CARVEDILOL 6.25 MG TABLET: 6.25 | 30 days supply | Qty: 30 | Fill #0

## 2016-08-13 NOTE — Progress Notes (Signed)
Patient is here for HTN  Patient complains about back pain   Patient been out his BP medication for a long time

## 2016-08-13 NOTE — Patient Instructions (Signed)
Hypertension °Hypertension is another name for high blood pressure. High blood pressure forces your heart to work harder to pump blood. This can cause problems over time. °There are two numbers in a blood pressure reading. There is a top number (systolic) over a bottom number (diastolic). It is best to have a blood pressure below 120/80. Healthy choices can help lower your blood pressure. You may need medicine to help lower your blood pressure if: °· Your blood pressure cannot be lowered with healthy choices. °· Your blood pressure is higher than 130/80. °Follow these instructions at home: °Eating and drinking  °· If directed, follow the DASH eating plan. This diet includes: °¨ Filling half of your plate at each meal with fruits and vegetables. °¨ Filling one quarter of your plate at each meal with whole grains. Whole grains include whole wheat pasta, brown rice, and whole grain bread. °¨ Eating or drinking low-fat dairy products, such as skim milk or low-fat yogurt. °¨ Filling one quarter of your plate at each meal with low-fat (lean) proteins. Low-fat proteins include fish, skinless chicken, eggs, beans, and tofu. °¨ Avoiding fatty meat, cured and processed meat, or chicken with skin. °¨ Avoiding premade or processed food. °· Eat less than 1,500 mg of salt (sodium) a day. °· Limit alcohol use to no more than 1 drink a day for nonpregnant women and 2 drinks a day for men. One drink equals 12 oz of beer, 5 oz of wine, or 1½ oz of hard liquor. °Lifestyle  °· Work with your doctor to stay at a healthy weight or to lose weight. Ask your doctor what the best weight is for you. °· Get at least 30 minutes of exercise that causes your heart to beat faster (aerobic exercise) most days of the week. This may include walking, swimming, or biking. °· Get at least 30 minutes of exercise that strengthens your muscles (resistance exercise) at least 3 days a week. This may include lifting weights or pilates. °· Do not use any  products that contain nicotine or tobacco. This includes cigarettes and e-cigarettes. If you need help quitting, ask your doctor. °· Check your blood pressure at home as told by your doctor. °· Keep all follow-up visits as told by your doctor. This is important. °Medicines  °· Take over-the-counter and prescription medicines only as told by your doctor. Follow directions carefully. °· Do not skip doses of blood pressure medicine. The medicine does not work as well if you skip doses. Skipping doses also puts you at risk for problems. °· Ask your doctor about side effects or reactions to medicines that you should watch for. °Contact a doctor if: °· You think you are having a reaction to the medicine you are taking. °· You have headaches that keep coming back (recurring). °· You feel dizzy. °· You have swelling in your ankles. °· You have trouble with your vision. °Get help right away if: °· You get a very bad headache. °· You start to feel confused. °· You feel weak or numb. °· You feel faint. °· You get very bad pain in your: °¨ Chest. °¨ Belly (abdomen). °· You throw up (vomit) more than once. °· You have trouble breathing. °Summary °· Hypertension is another name for high blood pressure. °· Making healthy choices can help lower blood pressure. If your blood pressure cannot be controlled with healthy choices, you may need to take medicine. °This information is not intended to replace advice given to you by your   health care provider. Make sure you discuss any questions you have with your health care provider. °Document Released: 10/22/2007 Document Revised: 04/02/2016 Document Reviewed: 04/02/2016 °Elsevier Interactive Patient Education © 2017 [employer] ° °

## 2016-08-13 NOTE — Progress Notes (Signed)
Subjective:  Patient ID: Riley Wagner, male    DOB: [birth year]  Age: 61 y.o. MRN: [id number]  CC: Hypertension   HPI Jehu Mccauslin presents for   HTN: Non-adherence with blood pressure medication. He as been without his blood pressure medications for six months. BP elevated in office. He denies any CP, SOB,  swelling of the BLE, or vision changes.    History of aback injury: He reports history of back operation in 1996. Chronic back pain. Reports taking OTC Tylenol with relief symptoms.     Outpatient Medications Prior to Visit  Medication Sig Dispense Refill  . cyclobenzaprine (FLEXERIL) 5 MG tablet Take 1 tablet (5 mg total) by mouth every 8 (eight) hours as needed for muscle spasms. 30 tablet 0  . gabapentin (NEURONTIN) 300 MG capsule Take 1 capsule (300 mg total) by mouth 3 (three) times daily. (Patient not taking: Reported on 12/22/2014) 90 capsule 3  . pravastatin (PRAVACHOL) 40 MG tablet Take 1 tablet (40 mg total) by mouth daily. (Patient not taking: Reported on 12/22/2014) 30 tablet 3  . ranitidine (ZANTAC) 150 MG tablet Take 150 mg by mouth daily as needed for heartburn.    . traMADol (ULTRAM) 50 MG tablet Take 1 tablet (50 mg total) by mouth every 8 (eight) hours as needed for moderate pain. 30 tablet 0  . acetaminophen (TYLENOL) 500 MG tablet Take 3,500 mg by mouth every 3 (three) hours.     Marland Kitchen amLODipine (NORVASC) 5 MG tablet Take 1 tablet (5 mg total) by mouth every morning. 30 tablet 0  . carvedilol (COREG) 6.25 MG tablet Take 0.5 tablets (3.125 mg total) by mouth 2 (two) times daily with a meal. 30 tablet 0  . hydrochlorothiazide (HYDRODIURIL) 25 MG tablet Take 1 tablet (25 mg total) by mouth daily. 30 tablet 3  . lisinopril (PRINIVIL,ZESTRIL) 30 MG tablet Take 1 tablet (30 mg total) by mouth daily. 30 tablet 0   No facility-administered medications prior to visit.     ROS Review of Systems  Eyes: Negative.   Respiratory: Negative.   Cardiovascular: Negative.     Gastrointestinal: Negative.   Skin: Negative.     Objective:  BP (!) 161/93   Pulse 76   Temp 98.4 F (36.9 C) (Oral)   Resp 18   Ht 5\' 4"  (1.626 m)   Wt 197 lb 3.2 oz (89.4 kg)   SpO2 98%   BMI 33.85 kg/m   BP/Weight 08/13/2016 0/10/3014 0/05/930  Systolic BP 355 732 202  Diastolic BP 93 90 542  Wt. (Lbs) 197.2 185.6 -  BMI 33.85 29.06 -     Physical Exam  Eyes: Conjunctivae are normal. Pupils are equal, round, and reactive to light.  Neck: No JVD present.  Cardiovascular: Normal rate, regular rhythm, normal heart sounds and intact distal pulses.   Pulmonary/Chest: Effort normal and breath sounds normal.  Abdominal: Soft. Bowel sounds are normal.  Musculoskeletal:       Lumbar back: He exhibits pain.  Skin: Skin is warm and dry.  Nursing note and vitals reviewed.    Assessment & Plan:   Problem List Items Addressed This Visit      Cardiovascular and Mediastinum   Essential hypertension - Primary   -BP elevated in office today clonidine given per protocol.    -Schedule follow up in 2 week for BP check with clinic RN.   -If BP is greater than 90/60 (MAP 65 or greater) but not less than 130/80 may increase  dose of     amlodipine to 10 mg QD and recheck in another 2 weeks with RN.    Relevant Medications   cloNIDine (CATAPRES) tablet 0.2 mg (Completed)   hydrochlorothiazide (HYDRODIURIL) 25 MG tablet   carvedilol (COREG) 6.25 MG tablet   lisinopril (PRINIVIL,ZESTRIL) 30 MG tablet   amLODipine (NORVASC) 5 MG tablet   Other Relevant Orders   Basic metabolic panel (Completed)   Lipid Panel (Completed)   Microalbumin/Creatinine Ratio, Urine (Completed)    Other Visit Diagnoses    Chronic midline low back pain, with sciatica presence unspecified       Relevant Medications   acetaminophen (TYLENOL) 500 MG tablet   At risk for medication nonadherence           - Patients' wifes'  home health caregiver notified CMA of patient having difficulty at home with managing           medications. Will consult with case management to see if [facility] services can be obtained for medication         management.    Meds ordered this encounter  Medications  . cloNIDine (CATAPRES) tablet 0.2 mg  . hydrochlorothiazide (HYDRODIURIL) 25 MG tablet    Sig: Take 1 tablet (25 mg total) by mouth daily.    Dispense:  30 tablet    Refill:  2    Order Specific Question:   Supervising Provider    Answer:   Tresa Garter [id number]  . carvedilol (COREG) 6.25 MG tablet    Sig: Take 0.5 tablets (3.125 mg total) by mouth 2 (two) times daily with a meal.    Dispense:  30 tablet    Refill:  2    Order Specific Question:   Supervising Provider    Answer:   Tresa Garter [id number]  . lisinopril (PRINIVIL,ZESTRIL) 30 MG tablet    Sig: Take 1 tablet (30 mg total) by mouth daily.    Dispense:  30 tablet    Refill:  2    Order Specific Question:   Supervising Provider    Answer:   Tresa Garter [id number]  . amLODipine (NORVASC) 5 MG tablet    Sig: Take 1 tablet (5 mg total) by mouth every morning.    Dispense:  30 tablet    Refill:  2    Order Specific Question:   Supervising Provider    Answer:   Tresa Garter [id number]  . acetaminophen (TYLENOL) 500 MG tablet    Sig: Take 2 tablets (1,000 mg total) by mouth every 6 (six) hours as needed.    Dispense:  30 tablet    Refill:  0    Order Specific Question:   Supervising Provider    Answer:   Tresa Garter [id number]    Follow-up: Return in about 2 weeks (around 08/27/2016) for BP check with Stacy.   Alfonse Spruce FNP

## 2016-08-14 LAB — LIPID PANEL
CHOLESTEROL TOTAL: 198 mg/dL (ref 100–199)
Chol/HDL Ratio: 5.5 ratio units — ABNORMAL HIGH (ref 0.0–5.0)
HDL: 36 mg/dL — AB (ref >39)
LDL CALC: 117 mg/dL — AB (ref 0–99)
Triglycerides: 224 mg/dL — ABNORMAL HIGH (ref 0–149)
VLDL Cholesterol Cal: 45 mg/dL — ABNORMAL HIGH (ref 5–40)

## 2016-08-14 LAB — BASIC METABOLIC PANEL
BUN/Creatinine Ratio: 11 (ref 10–24)
BUN: 16 mg/dL (ref 8–27)
CALCIUM: 8.9 mg/dL (ref 8.6–10.2)
CO2: 24 mmol/L (ref 18–29)
CREATININE: 1.4 mg/dL — AB (ref 0.76–1.27)
Chloride: 105 mmol/L (ref 96–106)
GFR calc Af Amer: 62 mL/min/{1.73_m2} (ref >59)
GFR, EST NON AFRICAN AMERICAN: 53 mL/min/{1.73_m2} — AB (ref >59)
GLUCOSE: 103 mg/dL — AB (ref 65–99)
Potassium: 4 mmol/L (ref 3.5–5.2)
SODIUM: 144 mmol/L (ref 134–144)

## 2016-08-14 LAB — MICROALBUMIN / CREATININE URINE RATIO
Creatinine, Urine: 235.8 mg/dL
Microalb/Creat Ratio: 22.1 mg/g creat (ref 0.0–30.0)
Microalbumin, Urine: 52.2 ug/mL

## 2016-08-18 ENCOUNTER — Other Ambulatory Visit: Payer: Self-pay | Admitting: Family Medicine

## 2016-08-18 DIAGNOSIS — E782 Mixed hyperlipidemia: Secondary | ICD-10-CM

## 2016-08-18 DIAGNOSIS — N183 Chronic kidney disease, stage 3 unspecified: Secondary | ICD-10-CM

## 2016-08-18 MED ORDER — ATORVASTATIN CALCIUM 20 MG PO TABS: 20.0000 mg | ORAL_TABLET | Freq: Every day | ORAL | 2 refills | Status: DC

## 2016-08-19 ENCOUNTER — Other Ambulatory Visit: Payer: Self-pay | Admitting: Family Medicine

## 2016-08-19 ENCOUNTER — Telehealth: Payer: Self-pay

## 2016-08-19 NOTE — Telephone Encounter (Signed)
CMA call patient to go over lab results  Patient did not answer CMA left a VM stating the reason of the call & to call us back

## 2016-08-19 NOTE — Telephone Encounter (Signed)
-----   Message from Riley Wagner, [place] sent at 08/18/2016  1:24 PM EDT ----- -Lab shows kidney function is stable since last check but is still decreased. Levels indicate you have chronic kidney disease stage 3.  Continue to take your medications for blood pressure, avoid taking NSAID medications, reduce salt intake to 2 to 4 grams/day, do not smoke. -Recommend monitoring again in 6 months. If your levels have signifcantly increased you will be referred to nephrology. -Lipid levels were elevated. This can increase your risk of heart disease. You have been prescribed atorvastatin to help with these levels. Recommend recheck in 3 months.  -Microalbumin/creatinine ratio level was normal. This tests for protein in your urine that could indicate early signs of kidney damage.

## 2016-08-20 ENCOUNTER — Other Ambulatory Visit: Payer: Self-pay | Admitting: Family Medicine

## 2016-08-27 ENCOUNTER — Ambulatory Visit: Payer: Medicare Other | Admitting: Pharmacist

## 2016-08-29 ENCOUNTER — Other Ambulatory Visit: Payer: Self-pay | Admitting: Family Medicine

## 2016-09-03 ENCOUNTER — Ambulatory Visit: Payer: Medicare Other | Admitting: Pharmacist

## 2016-09-09 MED FILL — ?ATORVASTATIN 20 MG TABLET: 20 | 30 days supply | Qty: 30 | Fill #0

## 2016-09-09 MED FILL — HYDROCHLOROTHIAZIDE 25 MG T: 25 | 30 days supply | Qty: 30 | Fill #1

## 2016-09-09 MED FILL — ?CARVEDILOL 6.25 MG TABLET: 6.25 | 30 days supply | Qty: 30 | Fill #1

## 2016-09-09 MED FILL — ?AMLODIPINE BESYLATE 5 MG T: 5 | 30 days supply | Qty: 30 | Fill #1

## 2016-09-18 ENCOUNTER — Ambulatory Visit: Payer: Medicare Other | Admitting: Pharmacist

## 2016-09-18 NOTE — Progress Notes (Deleted)
   S:    Patient arrives in good .    Presents to the clinic for hypertension evaluation. Patient was referred on ***.  Patient was last seen by Primary Care Provider on ***.   Patient {Actions; denies-reports:120008} adherence with medications.  Current BP Medications include:  Amlodipine 5 mg daily, lisinopril 30 mg daily, hydrochlorothiazide 25 mg daily, carvedilol 3.125 mg BID.  O:   Last 3 Office BP readings: BP Readings from Last 3 Encounters:  08/13/16 (!) 161/93  12/22/14 (!) 152/90  12/22/14 (!) 142/101    BMET    Component Value Date/Time   NA 144 08/13/2016 1705   K 4.0 08/13/2016 1705   CL 105 08/13/2016 1705   CO2 24 08/13/2016 1705   GLUCOSE 103 (H) 08/13/2016 1705   GLUCOSE 110 (H) 12/22/2014 1742   BUN 16 08/13/2016 1705   CREATININE 1.40 (H) 08/13/2016 1705   CREATININE 1.51 (H) 12/22/2014 1742   CALCIUM 8.9 08/13/2016 1705   GFRNONAA 53 (L) 08/13/2016 1705   GFRNONAA 49 (L) 12/22/2014 1742   GFRAA 62 08/13/2016 1705   GFRAA 57 (L) 12/22/2014 1742    A/P: Hypertension longstanding/newly diagnosed currently *** on current medications.  {Meds adjust:18428} ***.   Results reviewed and written information provided.   Total time in face-to-face counseling *** minutes.   F/U Clinic Visit with Dr. Marland Kitchen  Patient seen with Sallyanne Havers, PharmD Candidate

## 2016-10-09 ENCOUNTER — Other Ambulatory Visit: Payer: Self-pay | Admitting: Family Medicine

## 2016-10-09 DIAGNOSIS — I10 Essential (primary) hypertension: Secondary | ICD-10-CM

## 2016-10-09 MED FILL — ?ATORVASTATIN 20 MG TABLET: 20 | 30 days supply | Qty: 30 | Fill #1

## 2016-10-09 MED FILL — HYDROCHLOROTHIAZIDE 25 MG T: 25 | 30 days supply | Qty: 30 | Fill #2

## 2016-10-09 MED FILL — ?AMLODIPINE BESYLATE 5 MG T: 5 | 30 days supply | Qty: 30 | Fill #2

## 2016-10-09 MED FILL — CARVEDILOL 6.25 MG TABLET: 6.25 | 30 days supply | Qty: 30 | Fill #2

## 2016-11-13 ENCOUNTER — Ambulatory Visit: Payer: Medicare Other | Attending: Family Medicine | Admitting: Pharmacist

## 2016-11-13 VITALS — BP 155/90 | HR 58

## 2016-11-13 DIAGNOSIS — I10 Essential (primary) hypertension: Secondary | ICD-10-CM | POA: Diagnosis not present

## 2016-11-13 MED ORDER — HYDROCHLOROTHIAZIDE 25 MG PO TABS: 25.0000 mg | ORAL_TABLET | Freq: Every day | ORAL | 0 refills | Status: DC

## 2016-11-13 MED ORDER — LISINOPRIL 30 MG PO TABS: 30.0000 mg | ORAL_TABLET | Freq: Every day | ORAL | 0 refills | Status: DC

## 2016-11-13 MED ORDER — AMLODIPINE BESYLATE 5 MG PO TABS: 5.0000 mg | ORAL_TABLET | Freq: Every morning | ORAL | 0 refills | Status: DC

## 2016-11-13 MED ORDER — CARVEDILOL 6.25 MG PO TABS: 3.1250 mg | ORAL_TABLET | Freq: Two times a day (BID) | ORAL | 0 refills | Status: DC

## 2016-11-13 MED FILL — LISINOPRIL 10 MG TABLET: 10 | 30 days supply | Qty: 90 | Fill #0

## 2016-11-13 MED FILL — AMLODIPINE BESYLATE 5 MG TA: 5 | 30 days supply | Qty: 30 | Fill #0

## 2016-11-13 MED FILL — CARVEDILOL 6.25 MG TABLET: 6.25 | 30 days supply | Qty: 30 | Fill #0

## 2016-11-13 MED FILL — HYDROCHLOROTHIAZIDE 25 MG T: 25 | 30 days supply | Qty: 30 | Fill #0

## 2016-11-13 NOTE — Patient Instructions (Addendum)
Thanks for coming to see [place]  Please pick up your medications  Please schedule an appointment with [facility] in 2 weeks for 3 month follow up

## 2016-11-13 NOTE — Progress Notes (Signed)
   S:    Patient arrives in good spirits.    Presents to the clinic for hypertension evaluation. Patient was referred on 08/13/16 by Karie Mainland. [place].  Patient was last seen by Primary Care Provider on 08/13/16. Patient has had multiple no shows prior to this appt.  Patient reports adherence with medications but he ran out of his medications yesterday morning and did not take any this morning.  Current BP Medications include:  Amlodipine 5 mg daily, lisinopril 30 mg daily, hydrochlorothiazide 25 mg daily, carvedilol 3.125 mg BID (has 6.25 mg tablets)   O:   Last 3 Office BP readings: BP Readings from Last 3 Encounters:  11/13/16 (!) 155/90  08/13/16 (!) 161/93  12/22/14 (!) 152/90    BMET    Component Value Date/Time   NA 144 08/13/2016 1705   K 4.0 08/13/2016 1705   CL 105 08/13/2016 1705   CO2 24 08/13/2016 1705   GLUCOSE 103 (H) 08/13/2016 1705   GLUCOSE 110 (H) 12/22/2014 1742   BUN 16 08/13/2016 1705   CREATININE 1.40 (H) 08/13/2016 1705   CREATININE 1.51 (H) 12/22/2014 1742   CALCIUM 8.9 08/13/2016 1705   GFRNONAA 53 (L) 08/13/2016 1705   GFRNONAA 49 (L) 12/22/2014 1742   GFRAA 62 08/13/2016 1705   GFRAA 57 (L) 12/22/2014 1742    A/P: Hypertension longstanding currently UNcontrolled on current medications due to being without medications for >24 hours.  Continued current medications as prescribed - patient to pick up medications today and follow up with [place] in 2 weeks for 3 month follow up.   Results reviewed and written information provided.   Total time in face-to-face counseling 10 minutes.   F/U Clinic Visit with [facility] in 2 weeks.  Patient seen with Maple Mirza, PharmD Candidate and Mechele Claude, PharmD Candidate

## 2016-12-12 ENCOUNTER — Encounter: Payer: Self-pay | Admitting: Family Medicine

## 2016-12-12 ENCOUNTER — Ambulatory Visit: Payer: Medicare Other | Admitting: Family Medicine

## 2016-12-12 ENCOUNTER — Ambulatory Visit: Payer: Medicare Other | Attending: Family Medicine | Admitting: Family Medicine

## 2016-12-12 VITALS — BP 151/88 | HR 71 | Temp 98.4°F | Resp 18 | Ht 67.0 in | Wt 196.0 lb

## 2016-12-12 DIAGNOSIS — Z79899 Other long term (current) drug therapy: Secondary | ICD-10-CM | POA: Insufficient documentation

## 2016-12-12 DIAGNOSIS — I10 Essential (primary) hypertension: Secondary | ICD-10-CM | POA: Diagnosis not present

## 2016-12-12 DIAGNOSIS — E782 Mixed hyperlipidemia: Secondary | ICD-10-CM

## 2016-12-12 DIAGNOSIS — I252 Old myocardial infarction: Secondary | ICD-10-CM | POA: Diagnosis not present

## 2016-12-12 DIAGNOSIS — F17210 Nicotine dependence, cigarettes, uncomplicated: Secondary | ICD-10-CM | POA: Diagnosis not present

## 2016-12-12 MED ORDER — ATORVASTATIN CALCIUM 20 MG PO TABS: 20.0000 mg | ORAL_TABLET | Freq: Every day | ORAL | 0 refills | Status: DC

## 2016-12-12 MED ORDER — LISINOPRIL 40 MG PO TABS: 40.0000 mg | ORAL_TABLET | Freq: Every day | ORAL | 2 refills | Status: DC

## 2016-12-12 MED ORDER — CARVEDILOL 6.25 MG PO TABS: 3.1250 mg | ORAL_TABLET | Freq: Two times a day (BID) | ORAL | 2 refills | Status: DC

## 2016-12-12 MED ORDER — AMLODIPINE BESYLATE 10 MG PO TABS
10.0000 mg | ORAL_TABLET | Freq: Every morning | ORAL | 2 refills | Status: DC
Start: 2016-12-12 — End: 2017-02-13

## 2016-12-12 MED ORDER — HYDROCHLOROTHIAZIDE 25 MG PO TABS: 25.0000 mg | ORAL_TABLET | Freq: Every day | ORAL | 2 refills | Status: DC

## 2016-12-12 MED FILL — AMLODIPINE BESYLATE 10 MG T: 10 | 30 days supply | Qty: 30 | Fill #0

## 2016-12-12 MED FILL — ?ATORVASTATIN 20 MG TABLET: 20 | 30 days supply | Qty: 30 | Fill #0

## 2016-12-12 MED FILL — ?CARVEDILOL 6.25 MG TABLET: 6.25 | 30 days supply | Qty: 30 | Fill #0

## 2016-12-12 MED FILL — LISINOPRIL 40 MG TABLET: 40 | 30 days supply | Qty: 30 | Fill #0

## 2016-12-12 MED FILL — HYDROCHLOROTHIAZIDE 25 MG T: 25 | 30 days supply | Qty: 30 | Fill #0

## 2016-12-12 NOTE — Progress Notes (Signed)
Subjective:  Patient ID: Riley Wagner, male    DOB: 1955-05-30  Age: 61 y.o. MRN: [id number]  CC: Hypertension   HPI Riley Wagner presents for history of hypertension and hyperlipidemia. He is not exercising and is not adherent to low salt diet.  He does not check BP  at home. Cardiac symptoms none. He reports being without hist BP medications since yesterday. Patient denies chest pain, chest pressure/discomfort, claudication, dyspnea, fatigue, near-syncope, palpitations and syncope.  Cardiovascular risk factors: advanced age (older than 16 for men, 30 for women), dyslipidemia, hypertension, male gender, sedentary lifestyle and smoking/ tobacco exposure. Current smoker daily 1/2 pack per day. Not ready to quit. Use of agents associated with hypertension: none. History of target organ damage: angina/ prior myocardial infarction.  Outpatient Medications Prior to Visit  Medication Sig Dispense Refill  . acetaminophen (TYLENOL) 500 MG tablet Take 2 tablets (1,000 mg total) by mouth every 6 (six) hours as needed. 30 tablet 0  . cyclobenzaprine (FLEXERIL) 5 MG tablet Take 1 tablet (5 mg total) by mouth every 8 (eight) hours as needed for muscle spasms. 30 tablet 0  . ranitidine (ZANTAC) 150 MG tablet Take 150 mg by mouth daily as needed for heartburn.    Marland Kitchen amLODipine (NORVASC) 5 MG tablet Take 1 tablet (5 mg total) by mouth every morning. 30 tablet 0  . atorvastatin (LIPITOR) 20 MG tablet Take 1 tablet (20 mg total) by mouth daily. 30 tablet 2  . carvedilol (COREG) 6.25 MG tablet Take 0.5 tablets (3.125 mg total) by mouth 2 (two) times daily with a meal. 30 tablet 0  . hydrochlorothiazide (HYDRODIURIL) 25 MG tablet Take 1 tablet (25 mg total) by mouth daily. 30 tablet 0  . lisinopril (PRINIVIL,ZESTRIL) 30 MG tablet Take 1 tablet (30 mg total) by mouth daily. 30 tablet 0   No facility-administered medications prior to visit.     ROS Review of Systems  Constitutional: Negative.   Eyes: Negative.    Respiratory: Negative.   Cardiovascular: Negative.   Gastrointestinal: Negative.   Skin: Negative.   Neurological: Negative.         Objective:  BP (!) 151/88 (BP Location: Left Arm, Patient Position: Sitting, Cuff Size: Normal)   Pulse 71   Temp 98.4 F (36.9 C) (Oral)   Resp 18   Ht 5\' 7"  (1.702 m)   Wt 196 lb (88.9 kg)   SpO2 99%   BMI 30.70 kg/m   BP/Weight 12/12/2016 11/13/2016 0/86/5784  Systolic BP 696 295 284  Diastolic BP 88 90 93  Wt. (Lbs) 196 - 197.2  BMI 30.7 - 33.85     Physical Exam  Eyes: Pupils are equal, round, and reactive to light. Conjunctivae are normal.  Neck: No JVD present.  Cardiovascular: Normal rate, regular rhythm, normal heart sounds and intact distal pulses.   Pulmonary/Chest: Effort normal and breath sounds normal.  Abdominal: Soft. Bowel sounds are normal.  Skin: Skin is warm and dry.  Nursing note and vitals reviewed.  Assessment & Plan:   Problem List Items Addressed This Visit      Cardiovascular and Mediastinum   Essential hypertension - Primary   Follow up for BP recheck in 2 weeks.   Relevant Medications   hydrochlorothiazide (HYDRODIURIL) 25 MG tablet   lisinopril (PRINIVIL,ZESTRIL) 40 MG tablet   amLODipine (NORVASC) 10 MG tablet   carvedilol (COREG) 6.25 MG tablet   atorvastatin (LIPITOR) 20 MG tablet    Other Visit Diagnoses    Mixed  hyperlipidemia       Relevant Medications   hydrochlorothiazide (HYDRODIURIL) 25 MG tablet   lisinopril (PRINIVIL,ZESTRIL) 40 MG tablet   amLODipine (NORVASC) 10 MG tablet   carvedilol (COREG) 6.25 MG tablet   atorvastatin (LIPITOR) 20 MG tablet   Other Relevant Orders   Lipid Panel (Completed)   Hepatic Function Panel (Completed)      Meds ordered this encounter  Medications  . hydrochlorothiazide (HYDRODIURIL) 25 MG tablet    Sig: Take 1 tablet (25 mg total) by mouth daily.    Dispense:  30 tablet    Refill:  2    Order Specific Question:   Supervising Provider     Answer:   Tresa Garter [id number]  . lisinopril (PRINIVIL,ZESTRIL) 40 MG tablet    Sig: Take 1 tablet (40 mg total) by mouth daily.    Dispense:  30 tablet    Refill:  2    Order Specific Question:   Supervising Provider    Answer:   Tresa Garter [id number]  . amLODipine (NORVASC) 10 MG tablet    Sig: Take 1 tablet (10 mg total) by mouth every morning.    Dispense:  30 tablet    Refill:  2    Order Specific Question:   Supervising Provider    Answer:   Tresa Garter [id number]  . carvedilol (COREG) 6.25 MG tablet    Sig: Take 0.5 tablets (3.125 mg total) by mouth 2 (two) times daily with a meal.    Dispense:  30 tablet    Refill:  2    Order Specific Question:   Supervising Provider    Answer:   Tresa Garter [id number]  . atorvastatin (LIPITOR) 20 MG tablet    Sig: Take 1 tablet (20 mg total) by mouth daily.    Dispense:  30 tablet    Refill:  0    Order Specific Question:   Supervising Provider    Answer:   Tresa Garter [id number]    Follow-up: Return in about 2 weeks (around 12/26/2016) for HTN.   Alfonse Spruce FNP

## 2016-12-12 NOTE — Progress Notes (Signed)
Patient is here for htn f/up   Needs refill on BP med

## 2016-12-12 NOTE — Patient Instructions (Signed)

## 2016-12-13 LAB — LIPID PANEL
Chol/HDL Ratio: 6.7 ratio — ABNORMAL HIGH (ref 0.0–5.0)
Cholesterol, Total: 222 mg/dL — ABNORMAL HIGH (ref 100–199)
HDL: 33 mg/dL — AB (ref >39)
LDL CALC: 146 mg/dL — AB (ref 0–99)
Triglycerides: 216 mg/dL — ABNORMAL HIGH (ref 0–149)
VLDL CHOLESTEROL CAL: 43 mg/dL — AB (ref 5–40)

## 2016-12-13 LAB — HEPATIC FUNCTION PANEL
ALBUMIN: 4.4 g/dL (ref 3.6–4.8)
ALK PHOS: 90 IU/L (ref 39–117)
ALT: 31 IU/L (ref 0–44)
AST: 28 IU/L (ref 0–40)
Bilirubin Total: 0.5 mg/dL (ref 0.0–1.2)
Bilirubin, Direct: 0.12 mg/dL (ref 0.00–0.40)
TOTAL PROTEIN: 7.3 g/dL (ref 6.0–8.5)

## 2016-12-25 ENCOUNTER — Other Ambulatory Visit: Payer: Self-pay | Admitting: Family Medicine

## 2016-12-25 ENCOUNTER — Telehealth: Payer: Self-pay

## 2016-12-25 DIAGNOSIS — E782 Mixed hyperlipidemia: Secondary | ICD-10-CM

## 2016-12-25 MED ORDER — ASPIRIN EC 81 MG PO TBEC: 81.0000 mg | DELAYED_RELEASE_TABLET | Freq: Every day | ORAL | 3 refills | Status: DC

## 2016-12-25 MED ORDER — ATORVASTATIN CALCIUM 40 MG PO TABS: 40.0000 mg | ORAL_TABLET | Freq: Every day | ORAL | 3 refills | Status: DC

## 2016-12-25 NOTE — Telephone Encounter (Signed)
CMA call regarding lab results   Patient did not answer & no VM set up to leave message   

## 2016-12-25 NOTE — Telephone Encounter (Signed)
-----   Message from Alfonse Spruce, [place] sent at 12/25/2016  1:36 PM EDT ----- Lipid levels were elevated. This can increase your risk of heart disease overtime. Your dose of atorvastatin will be increased.  Start eating a diet low in saturated fat. Limit your intake of fried foods, red meats, and whole milk.  Liver function normal

## 2016-12-26 ENCOUNTER — Encounter: Payer: Self-pay | Admitting: Family Medicine

## 2016-12-26 ENCOUNTER — Ambulatory Visit: Payer: Medicare Other | Attending: Family Medicine | Admitting: Family Medicine

## 2016-12-26 VITALS — BP 127/80 | HR 62 | Temp 98.5°F | Resp 18 | Ht 67.0 in | Wt 192.0 lb

## 2016-12-26 DIAGNOSIS — Z7982 Long term (current) use of aspirin: Secondary | ICD-10-CM | POA: Insufficient documentation

## 2016-12-26 DIAGNOSIS — I10 Essential (primary) hypertension: Secondary | ICD-10-CM | POA: Diagnosis not present

## 2016-12-26 DIAGNOSIS — I252 Old myocardial infarction: Secondary | ICD-10-CM | POA: Diagnosis not present

## 2016-12-26 DIAGNOSIS — E785 Hyperlipidemia, unspecified: Secondary | ICD-10-CM | POA: Insufficient documentation

## 2016-12-26 DIAGNOSIS — Z79899 Other long term (current) drug therapy: Secondary | ICD-10-CM | POA: Diagnosis not present

## 2016-12-26 DIAGNOSIS — F17210 Nicotine dependence, cigarettes, uncomplicated: Secondary | ICD-10-CM | POA: Diagnosis not present

## 2016-12-26 DIAGNOSIS — Z09 Encounter for follow-up examination after completed treatment for conditions other than malignant neoplasm: Secondary | ICD-10-CM

## 2016-12-26 NOTE — Progress Notes (Signed)
Patient is here for HTN  

## 2016-12-26 NOTE — Patient Instructions (Signed)
Schedule lab appointment in one month.  DASH Eating Plan DASH stands for "Dietary Approaches to Stop Hypertension." The DASH eating plan is a healthy eating plan that has been shown to reduce high blood pressure (hypertension). It may also reduce your risk for type 2 diabetes, heart disease, and stroke. The DASH eating plan may also help with weight loss. What are tips for following this plan? General guidelines  Avoid eating more than 2,300 mg (milligrams) of salt (sodium) a day. If you have hypertension, you may need to reduce your sodium intake to 1,500 mg a day.  Limit alcohol intake to no more than 1 drink a day for nonpregnant women and 2 drinks a day for men. One drink equals 12 oz of beer, 5 oz of wine, or 1 oz of hard liquor.  Work with your health care provider to maintain a healthy body weight or to lose weight. Ask what an ideal weight is for you.  Get at least 30 minutes of exercise that causes your heart to beat faster (aerobic exercise) most days of the week. Activities may include walking, swimming, or biking.  Work with your health care provider or diet and nutrition specialist (dietitian) to adjust your eating plan to your individual calorie needs. Reading food labels  Check food labels for the amount of sodium per serving. Choose foods with less than 5 percent of the Daily Value of sodium. Generally, foods with less than 300 mg of sodium per serving fit into this eating plan.  To find whole grains, look for the word "whole" as the first word in the ingredient list. Shopping  Buy products labeled as "low-sodium" or "no salt added."  Buy fresh foods. Avoid canned foods and premade or frozen meals. Cooking  Avoid adding salt when cooking. Use salt-free seasonings or herbs instead of table salt or sea salt. Check with your health care provider or pharmacist before using salt substitutes.  Do not fry foods. Cook foods using healthy methods such as baking, boiling,  grilling, and broiling instead.  Cook with heart-healthy oils, such as olive, canola, soybean, or sunflower oil. Meal planning   Eat a balanced diet that includes: ? 5 or more servings of fruits and vegetables each day. At each meal, try to fill half of your plate with fruits and vegetables. ? Up to 6-8 servings of whole grains each day. ? Less than 6 oz of lean meat, poultry, or fish each day. A 3-oz serving of meat is about the same size as a deck of cards. One egg equals 1 oz. ? 2 servings of low-fat dairy each day. ? A serving of nuts, seeds, or beans 5 times each week. ? Heart-healthy fats. Healthy fats called Omega-3 fatty acids are found in foods such as flaxseeds and coldwater fish, like sardines, salmon, and mackerel.  Limit how much you eat of the following: ? Canned or prepackaged foods. ? Food that is high in trans fat, such as fried foods. ? Food that is high in saturated fat, such as fatty meat. ? Sweets, desserts, sugary drinks, and other foods with added sugar. ? Full-fat dairy products.  Do not salt foods before eating.  Try to eat at least 2 vegetarian meals each week.  Eat more home-cooked food and less restaurant, buffet, and fast food.  When eating at a restaurant, ask that your food be prepared with less salt or no salt, if possible. What foods are recommended? The items listed may not be a complete  list. Talk with your dietitian about what dietary choices are best for you. Grains Whole-grain or whole-wheat bread. Whole-grain or whole-wheat pasta. Brown rice. [place]row. Bulgur. Whole-grain and low-sodium cereals. Pita bread. Low-fat, low-sodium crackers. Whole-wheat flour tortillas. Vegetables Fresh or frozen vegetables (raw, steamed, roasted, or grilled). Low-sodium or reduced-sodium tomato and vegetable juice. Low-sodium or reduced-sodium tomato sauce and tomato paste. Low-sodium or reduced-sodium canned vegetables. Fruits All fresh, dried, or frozen  fruit. Canned fruit in natural juice (without added sugar). Meat and other protein foods Skinless chicken or [place]. Ground chicken or [place]. Pork with fat trimmed off. Fish and seafood. Egg whites. Dried beans, peas, or lentils. Unsalted nuts, nut butters, and seeds. Unsalted canned beans. Lean cuts of beef with fat trimmed off. Low-sodium, lean deli meat. Dairy Low-fat (1%) or fat-free (skim) milk. Fat-free, low-fat, or reduced-fat cheeses. Nonfat, low-sodium ricotta or cottage cheese. Low-fat or nonfat yogurt. Low-fat, low-sodium cheese. Fats and oils Soft margarine without trans fats. Vegetable oil. Low-fat, reduced-fat, or light mayonnaise and salad dressings (reduced-sodium). Canola, safflower, olive, soybean, and sunflower oils. Avocado. Seasoning and other foods Herbs. Spices. Seasoning mixes without salt. Unsalted popcorn and pretzels. Fat-free sweets. What foods are not recommended? The items listed may not be a complete list. Talk with your dietitian about what dietary choices are best for you. Grains Baked goods made with fat, such as croissants, muffins, or some breads. Dry pasta or rice meal packs. Vegetables Creamed or fried vegetables. Vegetables in a cheese sauce. Regular canned vegetables (not low-sodium or reduced-sodium). Regular canned tomato sauce and paste (not low-sodium or reduced-sodium). Regular tomato and vegetable juice (not low-sodium or reduced-sodium). Angie Fava. Olives. Fruits Canned fruit in a light or heavy syrup. Fried fruit. Fruit in cream or butter sauce. Meat and other protein foods Fatty cuts of meat. Ribs. Fried meat. Berniece Salines. Sausage. Bologna and other processed lunch meats. Salami. Fatback. Hotdogs. Bratwurst. Salted nuts and seeds. Canned beans with added salt. Canned or smoked fish. Whole eggs or egg yolks. Chicken or [place] with skin. Dairy Whole or 2% milk, cream, and half-and-half. Whole or full-fat cream cheese. Whole-fat or sweetened yogurt. Full-fat  cheese. Nondairy creamers. Whipped toppings. Processed cheese and cheese spreads. Fats and oils Butter. Stick margarine. Lard. Shortening. Ghee. Bacon fat. Tropical oils, such as coconut, palm kernel, or palm oil. Seasoning and other foods Salted popcorn and pretzels. Onion salt, garlic salt, seasoned salt, table salt, and sea salt. Worcestershire sauce. Tartar sauce. Barbecue sauce. Teriyaki sauce. Soy sauce, including reduced-sodium. Steak sauce. Canned and packaged gravies. Fish sauce. Oyster sauce. Cocktail sauce. Horseradish that you find on the shelf. Ketchup. Mustard. Meat flavorings and tenderizers. Bouillon cubes. Hot sauce and Tabasco sauce. Premade or packaged marinades. Premade or packaged taco seasonings. Relishes. Regular salad dressings. Where to find more information:  National Heart, Lung, and [place]: https://wilson-eaton.com/  American Heart Association: www.heart.org Summary  The DASH eating plan is a healthy eating plan that has been shown to reduce high blood pressure (hypertension). It may also reduce your risk for type 2 diabetes, heart disease, and stroke.  With the DASH eating plan, you should limit salt (sodium) intake to 2,300 mg a day. If you have hypertension, you may need to reduce your sodium intake to 1,500 mg a day.  When on the DASH eating plan, aim to eat more fresh fruits and vegetables, whole grains, lean proteins, low-fat dairy, and heart-healthy fats.  Work with your health care provider or diet and nutrition specialist (dietitian) to  adjust your eating plan to your individual calorie needs. This information is not intended to replace advice given to you by your health care provider. Make sure you discuss any questions you have with your health care provider. Document Released: 04/24/2011 Document Revised: 04/28/2016 Document Reviewed: 04/28/2016 Elsevier Interactive Patient Education  2017 [employer].

## 2016-12-26 NOTE — Progress Notes (Signed)
Subjective:  Patient ID: Riley Wagner, male    DOB: Jul 28, 1955  Age: 61 y.o. MRN: [id number]  CC: Hypertension   HPI Riley Wagner presents for history of hypertension and hyperlipidemia. He is not exercising and is not adherent to low salt diet.  He does not check BP  at home. Cardiac symptoms none. He reports being without hist BP medications since yesterday. Patient denies chest pain, chest pressure/discomfort, claudication, dyspnea, fatigue, near-syncope, palpitations and syncope.  Cardiovascular risk factors: advanced age (older than 6 for men, 63 for women), dyslipidemia, hypertension, male gender, sedentary lifestyle and smoking/ tobacco exposure. Current smoker daily 1/2 pack per day. Not ready to quit. Use of agents associated with hypertension: none. History of target organ damage: angina/ prior myocardial infarction     Outpatient Medications Prior to Visit  Medication Sig Dispense Refill  . acetaminophen (TYLENOL) 500 MG tablet Take 2 tablets (1,000 mg total) by mouth every 6 (six) hours as needed. 30 tablet 0  . amLODipine (NORVASC) 10 MG tablet Take 1 tablet (10 mg total) by mouth every morning. 30 tablet 2  . aspirin EC 81 MG tablet Take 1 tablet (81 mg total) by mouth daily. 90 tablet 3  . atorvastatin (LIPITOR) 40 MG tablet Take 1 tablet (40 mg total) by mouth daily. 90 tablet 3  . carvedilol (COREG) 6.25 MG tablet Take 0.5 tablets (3.125 mg total) by mouth 2 (two) times daily with a meal. 30 tablet 2  . cyclobenzaprine (FLEXERIL) 5 MG tablet Take 1 tablet (5 mg total) by mouth every 8 (eight) hours as needed for muscle spasms. 30 tablet 0  . hydrochlorothiazide (HYDRODIURIL) 25 MG tablet Take 1 tablet (25 mg total) by mouth daily. 30 tablet 2  . lisinopril (PRINIVIL,ZESTRIL) 40 MG tablet Take 1 tablet (40 mg total) by mouth daily. 30 tablet 2  . ranitidine (ZANTAC) 150 MG tablet Take 150 mg by mouth daily as needed for heartburn.     No facility-administered medications  prior to visit.     ROS Review of Systems  Constitutional: Negative.   Eyes: Negative.   Respiratory: Negative.   Cardiovascular: Negative.   Gastrointestinal: Negative.   Skin: Negative.   Psychiatric/Behavioral: Negative.     Objective:  BP 127/80 (BP Location: Left Arm, Patient Position: Sitting, Cuff Size: Normal)   Pulse 62   Temp 98.5 F (36.9 C) (Oral)   Resp 18   Ht 5\' 7"  (1.702 m)   Wt 192 lb (87.1 kg)   SpO2 97%   BMI 30.07 kg/m   BP/Weight 12/26/2016 12/12/2016 09/12/8339  Systolic BP 962 229 798  Diastolic BP 80 88 90  Wt. (Lbs) 192 196 -  BMI 30.07 30.7 -     Physical Exam  Eyes: Pupils are equal, round, and reactive to light. Conjunctivae are normal.  Neck: No JVD present.  Cardiovascular: Normal rate, regular rhythm, normal heart sounds and intact distal pulses.   Pulmonary/Chest: Effort normal and breath sounds normal.  Abdominal: Soft. Bowel sounds are normal.  Skin: Skin is warm and dry.  Nursing note and vitals reviewed.   Assessment & Plan:   Problem List Items Addressed This Visit      Cardiovascular and Mediastinum   Essential hypertension   Future orders to re-evaluate kidney function   Relevant Orders   Basic metabolic panel    Other Visit Diagnoses    Follow up    -  Primary   Relevant Orders   Basic metabolic panel  No orders of the defined types were placed in this encounter.   Follow-up: Return in about 1 month (around 01/26/2017) for  Lab Appointment .   Alfonse Spruce FNP

## 2017-01-12 MED FILL — LISINOPRIL 40 MG TABLET: 40 | 30 days supply | Qty: 30 | Fill #1

## 2017-01-12 MED FILL — ?CARVEDILOL 6.25 MG TABLET: 6.25 | 30 days supply | Qty: 30 | Fill #1

## 2017-01-12 MED FILL — AMLODIPINE BESYLATE 10 MG T: 10 | 30 days supply | Qty: 30 | Fill #1

## 2017-01-12 MED FILL — ?ATORVASTATIN 40MG TABLET: 40 | 30 days supply | Qty: 30 | Fill #0

## 2017-01-12 MED FILL — HYDROCHLOROTHIAZIDE 25 MG T: 25 | 30 days supply | Qty: 30 | Fill #1

## 2017-01-26 ENCOUNTER — Other Ambulatory Visit: Payer: Medicare Other

## 2017-02-12 MED FILL — LISINOPRIL 40 MG TABLET: 40 | 30 days supply | Qty: 30 | Fill #2

## 2017-02-12 MED FILL — HYDROCHLOROTHIAZIDE 25 MG T: 25 | 30 days supply | Qty: 30 | Fill #2

## 2017-02-12 MED FILL — ?ATORVASTATIN 40MG TABLET: 40 | 30 days supply | Qty: 30 | Fill #1

## 2017-02-12 MED FILL — CARVEDILOL 6.25 MG TABLET: 6.25 | 30 days supply | Qty: 30 | Fill #2

## 2017-02-12 MED FILL — AMLODIPINE BESYLATE 10 MG T: 10 | 30 days supply | Qty: 30 | Fill #2

## 2017-02-13 ENCOUNTER — Ambulatory Visit: Payer: Medicare Other | Attending: Family Medicine | Admitting: Family Medicine

## 2017-02-13 ENCOUNTER — Encounter: Payer: Self-pay | Admitting: Family Medicine

## 2017-02-13 VITALS — BP 138/80 | HR 72 | Temp 98.5°F | Resp 18 | Ht 67.0 in | Wt 190.4 lb

## 2017-02-13 DIAGNOSIS — Z7982 Long term (current) use of aspirin: Secondary | ICD-10-CM | POA: Diagnosis not present

## 2017-02-13 DIAGNOSIS — E782 Mixed hyperlipidemia: Secondary | ICD-10-CM | POA: Diagnosis not present

## 2017-02-13 DIAGNOSIS — I10 Essential (primary) hypertension: Secondary | ICD-10-CM | POA: Diagnosis not present

## 2017-02-13 DIAGNOSIS — M545 Low back pain, unspecified: Secondary | ICD-10-CM

## 2017-02-13 DIAGNOSIS — G8929 Other chronic pain: Secondary | ICD-10-CM

## 2017-02-13 DIAGNOSIS — Z79899 Other long term (current) drug therapy: Secondary | ICD-10-CM | POA: Insufficient documentation

## 2017-02-13 DIAGNOSIS — M5137 Other intervertebral disc degeneration, lumbosacral region: Secondary | ICD-10-CM | POA: Diagnosis not present

## 2017-02-13 DIAGNOSIS — F17210 Nicotine dependence, cigarettes, uncomplicated: Secondary | ICD-10-CM | POA: Diagnosis not present

## 2017-02-13 MED ORDER — HYDROCHLOROTHIAZIDE 25 MG PO TABS: 25.0000 mg | ORAL_TABLET | Freq: Every day | ORAL | 1 refills | Status: DC

## 2017-02-13 MED ORDER — CARVEDILOL 3.125 MG PO TABS: ORAL_TABLET | ORAL | 1 refills | Status: DC

## 2017-02-13 MED ORDER — CYCLOBENZAPRINE HCL 5 MG PO TABS: 5.0000 mg | ORAL_TABLET | Freq: Three times a day (TID) | ORAL | 1 refills | Status: DC | PRN

## 2017-02-13 MED ORDER — ASPIRIN EC 81 MG PO TBEC: 81.0000 mg | DELAYED_RELEASE_TABLET | Freq: Every day | ORAL | 1 refills | Status: DC

## 2017-02-13 MED ORDER — ATORVASTATIN CALCIUM 40 MG PO TABS: 40.0000 mg | ORAL_TABLET | Freq: Every day | ORAL | 1 refills | Status: DC

## 2017-02-13 MED ORDER — LISINOPRIL 40 MG PO TABS: 40.0000 mg | ORAL_TABLET | Freq: Every day | ORAL | 1 refills | Status: DC

## 2017-02-13 MED ORDER — ACETAMINOPHEN 500 MG PO TABS: 1000.0000 mg | ORAL_TABLET | Freq: Four times a day (QID) | ORAL | 0 refills | Status: DC | PRN

## 2017-02-13 MED ORDER — AMLODIPINE BESYLATE 10 MG PO TABS: 10.0000 mg | ORAL_TABLET | Freq: Every morning | ORAL | 1 refills | Status: DC

## 2017-02-13 MED FILL — ?CYCLOBENZAPRINE 5 MG TABLE: 5 | 10 days supply | Qty: 30 | Fill #0

## 2017-02-13 MED FILL — ?CARVEDILOL 3.125 MG TABLET: 3.125 | 30 days supply | Qty: 60 | Fill #0

## 2017-02-13 NOTE — Patient Instructions (Signed)

## 2017-02-13 NOTE — Progress Notes (Signed)
Patient is here for meds been without for 2 days

## 2017-02-13 NOTE — Progress Notes (Deleted)
   Subjective:  Patient ID: Riley Wagner, male    DOB: 1955/06/09  Age: 61 y.o. MRN: [id number]  CC: Hypertension   HPI Riley Wagner presents for ***  sciatica left leg  2 weeks ago   No vaccine     Outpatient Medications Prior to Visit  Medication Sig Dispense Refill  . acetaminophen (TYLENOL) 500 MG tablet Take 2 tablets (1,000 mg total) by mouth every 6 (six) hours as needed. 30 tablet 0  . amLODipine (NORVASC) 10 MG tablet Take 1 tablet (10 mg total) by mouth every morning. 30 tablet 2  . aspirin EC 81 MG tablet Take 1 tablet (81 mg total) by mouth daily. 90 tablet 3  . atorvastatin (LIPITOR) 40 MG tablet Take 1 tablet (40 mg total) by mouth daily. 90 tablet 3  . carvedilol (COREG) 6.25 MG tablet Take 0.5 tablets (3.125 mg total) by mouth 2 (two) times daily with a meal. 30 tablet 2  . cyclobenzaprine (FLEXERIL) 5 MG tablet Take 1 tablet (5 mg total) by mouth every 8 (eight) hours as needed for muscle spasms. 30 tablet 0  . hydrochlorothiazide (HYDRODIURIL) 25 MG tablet Take 1 tablet (25 mg total) by mouth daily. 30 tablet 2  . lisinopril (PRINIVIL,ZESTRIL) 40 MG tablet Take 1 tablet (40 mg total) by mouth daily. 30 tablet 2  . ranitidine (ZANTAC) 150 MG tablet Take 150 mg by mouth daily as needed for heartburn.     No facility-administered medications prior to visit.     ROS Review of Systems    Objective:  BP 138/80 (BP Location: Left Arm, Patient Position: Sitting, Cuff Size: Normal)   Pulse 72   Temp 98.5 F (36.9 C) (Oral)   Resp 18   Ht 5\' 7"  (1.702 m)   Wt 190 lb 6.4 oz (86.4 kg)   SpO2 97%   BMI 29.82 kg/m   BP/Weight 02/13/2017 12/26/2016 8/33/8250  Systolic BP 539 767 341  Diastolic BP 80 80 88  Wt. (Lbs) 190.4 192 196  BMI 29.82 30.07 30.7     Physical Exam   Assessment & Plan:   Problem List Items Addressed This Visit      Cardiovascular and Mediastinum   Essential hypertension - Primary      No orders of the defined types were placed  in this encounter.   Follow-up: No Follow-up on file.   Alfonse Spruce FNP

## 2017-02-19 NOTE — Progress Notes (Signed)
Subjective:  Patient ID: Riley Wagner, male    DOB: July 02, 1955  Age: 61 y.o. MRN: [id number]  CC: Hypertension   HPI Riley Wagner presents for history of hypertension and hyperlipidemia. He is not exercising and is not adherent to low salt diet.  He does not check BP  at home. Cardiac symptoms none. He reports being without hist BP medications since yesterday. Patient denies chest pain, chest pressure/discomfort, claudication, dyspnea, fatigue, near-syncope, palpitations and syncope.  Cardiovascular risk factors: advanced age (older than 2 for men, 81 for women), dyslipidemia, hypertension, male gender, sedentary lifestyle and smoking/ tobacco exposure. Current smoker daily 1/2 pack per day. Not ready to quit. Use of agents associated with hypertension: none. History of target organ damage: angina/ prior myocardial infarction.  Patient complains of chronic low back pain.  It was related to injury. He reports history of back operation in 1996.  Symptoms stable. Factors which relieve the pain include acetaminophen.     Outpatient Medications Prior to Visit  Medication Sig Dispense Refill  . ranitidine (ZANTAC) 150 MG tablet Take 150 mg by mouth daily as needed for heartburn.    Marland Kitchen acetaminophen (TYLENOL) 500 MG tablet Take 2 tablets (1,000 mg total) by mouth every 6 (six) hours as needed. 30 tablet 0  . amLODipine (NORVASC) 10 MG tablet Take 1 tablet (10 mg total) by mouth every morning. 30 tablet 2  . aspirin EC 81 MG tablet Take 1 tablet (81 mg total) by mouth daily. 90 tablet 3  . atorvastatin (LIPITOR) 40 MG tablet Take 1 tablet (40 mg total) by mouth daily. 90 tablet 3  . carvedilol (COREG) 6.25 MG tablet Take 0.5 tablets (3.125 mg total) by mouth 2 (two) times daily with a meal. 30 tablet 2  . cyclobenzaprine (FLEXERIL) 5 MG tablet Take 1 tablet (5 mg total) by mouth every 8 (eight) hours as needed for muscle spasms. 30 tablet 0  . hydrochlorothiazide (HYDRODIURIL) 25 MG tablet Take 1 tablet  (25 mg total) by mouth daily. 30 tablet 2  . lisinopril (PRINIVIL,ZESTRIL) 40 MG tablet Take 1 tablet (40 mg total) by mouth daily. 30 tablet 2   No facility-administered medications prior to visit.     ROS Review of Systems  Constitutional: Negative.   Eyes: Negative.   Respiratory: Negative.   Cardiovascular: Negative.   Gastrointestinal: Negative.   Musculoskeletal: Positive for back pain.  Skin: Negative.   Psychiatric/Behavioral: Negative.     Objective:  BP 138/80 (BP Location: Left Arm, Patient Position: Sitting, Cuff Size: Normal)   Pulse 72   Temp 98.5 F (36.9 C) (Oral)   Resp 18   Ht 5\' 7"  (1.702 m)   Wt 190 lb 6.4 oz (86.4 kg)   SpO2 97%   BMI 29.82 kg/m   BP/Weight 02/13/2017 12/26/2016 0/01/3817  Systolic BP 299 371 696  Diastolic BP 80 80 88  Wt. (Lbs) 190.4 192 196  BMI 29.82 30.07 30.7     Physical Exam  Constitutional: He is oriented to person, place, and time. He appears well-developed and well-nourished.  Eyes: Pupils are equal, round, and reactive to light. Conjunctivae are normal.  Neck: No JVD present.  Cardiovascular: Normal rate, regular rhythm, normal heart sounds and intact distal pulses.   Pulmonary/Chest: Effort normal and breath sounds normal.  Abdominal: Soft. Bowel sounds are normal. There is no tenderness.  Musculoskeletal: Normal range of motion.       Lumbar back: He exhibits pain (negative straight leg test).  Neurological: He is alert and oriented to person, place, and time. He has normal reflexes.  Skin: Skin is warm and dry.  Nursing note and vitals reviewed.   Assessment & Plan:   1. Essential hypertension  - lisinopril (PRINIVIL,ZESTRIL) 40 MG tablet; Take 1 tablet (40 mg total) by mouth daily.  Dispense: 90 tablet; Refill: 1 - carvedilol (COREG) 3.125 MG tablet; TAKE ONE TABLET BY MOUTH TWICE DAY.  Dispense: 180 tablet; Refill: 1 - hydrochlorothiazide (HYDRODIURIL) 25 MG tablet; Take 1 tablet (25 mg total) by mouth  daily.  Dispense: 90 tablet; Refill: 1 - amLODipine (NORVASC) 10 MG tablet; Take 1 tablet (10 mg total) by mouth every morning.  Dispense: 90 tablet; Refill: 1 - aspirin EC 81 MG tablet; Take 1 tablet (81 mg total) by mouth daily.  Dispense: 90 tablet; Refill: 1 - atorvastatin (LIPITOR) 40 MG tablet; Take 1 tablet (40 mg total) by mouth daily.  Dispense: 90 tablet; Refill: 1  2. DDD (degenerative disc disease), lumbosacral  - cyclobenzaprine (FLEXERIL) 5 MG tablet; Take 1 tablet (5 mg total) by mouth every 8 (eight) hours as needed for muscle spasms.  Dispense: 30 tablet; Refill: 1  3. Chronic bilateral low back pain without sciatica  - cyclobenzaprine (FLEXERIL) 5 MG tablet; Take 1 tablet (5 mg total) by mouth every 8 (eight) hours as needed for muscle spasms.  Dispense: 30 tablet; Refill: 1 - acetaminophen (TYLENOL) 500 MG tablet; Take 2 tablets (1,000 mg total) by mouth every 6 (six) hours as needed.  Dispense: 30 tablet; Refill: 0  4. Mixed hyperlipidemia  - aspirin EC 81 MG tablet; Take 1 tablet (81 mg total) by mouth daily.  Dispense: 90 tablet; Refill: 1 - atorvastatin (LIPITOR) 40 MG tablet; Take 1 tablet (40 mg total) by mouth daily.  Dispense: 90 tablet; Refill: 1   Meds ordered this encounter  Medications  . cyclobenzaprine (FLEXERIL) 5 MG tablet    Sig: Take 1 tablet (5 mg total) by mouth every 8 (eight) hours as needed for muscle spasms.    Dispense:  30 tablet    Refill:  1    Order Specific Question:   Supervising Provider    Answer:   Tresa Garter [id number]  . acetaminophen (TYLENOL) 500 MG tablet    Sig: Take 2 tablets (1,000 mg total) by mouth every 6 (six) hours as needed.    Dispense:  30 tablet    Refill:  0    Order Specific Question:   Supervising Provider    Answer:   Tresa Garter [id number]  . lisinopril (PRINIVIL,ZESTRIL) 40 MG tablet    Sig: Take 1 tablet (40 mg total) by mouth daily.    Dispense:  90 tablet    Refill:  1    Order  Specific Question:   Supervising Provider    Answer:   Tresa Garter [id number]  . carvedilol (COREG) 3.125 MG tablet    Sig: TAKE ONE TABLET BY MOUTH TWICE DAY.    Dispense:  180 tablet    Refill:  1    Order Specific Question:   Supervising Provider    Answer:   Tresa Garter [id number]  . hydrochlorothiazide (HYDRODIURIL) 25 MG tablet    Sig: Take 1 tablet (25 mg total) by mouth daily.    Dispense:  90 tablet    Refill:  1    Order Specific Question:   Supervising Provider    Answer:   Tresa Garter [id number]  .  amLODipine (NORVASC) 10 MG tablet    Sig: Take 1 tablet (10 mg total) by mouth every morning.    Dispense:  90 tablet    Refill:  1    Order Specific Question:   Supervising Provider    Answer:   Tresa Garter [id number]  . aspirin EC 81 MG tablet    Sig: Take 1 tablet (81 mg total) by mouth daily.    Dispense:  90 tablet    Refill:  1    Order Specific Question:   Supervising Provider    Answer:   Tresa Garter [id number]  . atorvastatin (LIPITOR) 40 MG tablet    Sig: Take 1 tablet (40 mg total) by mouth daily.    Dispense:  90 tablet    Refill:  1    Order Specific Question:   Supervising Provider    Answer:   Tresa Garter [id number]    Follow-up: Return in about 3 months (around 05/15/2017), or if symptoms worsen or fail to improve, for HTN/HLD/Back Pain.   Alfonse Spruce FNP

## 2017-03-17 MED FILL — ?CARVEDILOL 3.125 MG TABLET: 3.125 | 30 days supply | Qty: 60 | Fill #1

## 2017-03-17 MED FILL — ?ATORVASTATIN 40MG TABLET: 40 | 30 days supply | Qty: 30 | Fill #2

## 2017-03-17 MED FILL — HYDROCHLOROTHIAZIDE 25 MG T: 25 | 30 days supply | Qty: 30 | Fill #0

## 2017-03-17 MED FILL — AMLODIPINE BESYLATE 10 MG T: 10 | 30 days supply | Qty: 30 | Fill #0

## 2017-03-17 MED FILL — ?CYCLOBENZAPRINE 5MG TABLET: 5 | 10 days supply | Qty: 30 | Fill #1

## 2017-04-13 MED FILL — AMLODIPINE BESYLATE 10 MG T: 10 | 30 days supply | Qty: 30 | Fill #1

## 2017-04-13 MED FILL — ?CARVEDILOL 3.125 MG TABLET: 3.125 | 30 days supply | Qty: 60 | Fill #2

## 2017-04-13 MED FILL — HYDROCHLOROTHIAZIDE 25 MG T: 25 | 30 days supply | Qty: 30 | Fill #1

## 2017-05-20 MED FILL — HYDROCHLOROTHIAZIDE 25 MG T: 25 | 30 days supply | Qty: 30 | Fill #2

## 2017-05-20 MED FILL — AMLODIPINE BESYLATE 10 MG T: 10 | 30 days supply | Qty: 30 | Fill #2

## 2017-05-20 MED FILL — ?CARVEDILOL 3.125 MG TABLET: 3.125 | 30 days supply | Qty: 60 | Fill #3

## 2017-05-21 ENCOUNTER — Ambulatory Visit: Payer: Medicare Other | Admitting: Family Medicine

## 2017-05-29 ENCOUNTER — Ambulatory Visit: Payer: Medicare Other | Admitting: Family Medicine

## 2017-07-01 MED FILL — ?AMLODIPINE BESYLATE 10MG T: 10 | 30 days supply | Qty: 30 | Fill #3

## 2017-07-01 MED FILL — HYDROCHLOROTHIAZIDE 25 MG T: 25 | 30 days supply | Qty: 30 | Fill #3

## 2017-07-01 MED FILL — ?CARVEDILOL 3.125 MG TABLET: 3.125 | 30 days supply | Qty: 60 | Fill #4

## 2017-07-29 MED FILL — HYDROCHLOROTHIAZIDE 25 MG T: 25 | 30 days supply | Qty: 30 | Fill #4

## 2017-07-29 MED FILL — AMLODIPINE BESYLATE 10 MG T: 10 | 30 days supply | Qty: 30 | Fill #4

## 2017-07-29 MED FILL — CARVEDILOL 3.125 MG TABLET: 3.125 | 30 days supply | Qty: 60 | Fill #5

## 2017-08-31 ENCOUNTER — Telehealth: Payer: Self-pay | Admitting: Family Medicine

## 2017-08-31 NOTE — Telephone Encounter (Signed)
Pt called to get a refill for carvedilol (COREG) 3.125 MG tablet  lisinopril (PRINIVIL,ZESTRIL) 40 MG tablet hydrochlorothiazide (HYDRODIURIL) 25 MG tablet  Please sent it to Pharmacy:  [place], [place] [address]

## 2017-09-01 NOTE — Telephone Encounter (Signed)
LVM to call back to schedule an appt to get his refill

## 2017-09-01 NOTE — Telephone Encounter (Signed)
He has not been seen since September and no showed last visit. Needs appt with new provider for refills. If no appts, will need approval by Dr. Margarita Rana for refills.

## 2017-09-14 MED FILL — LISINOPRIL 40 MG TAB: 40 | 30 days supply | Qty: 30 | Fill #0

## 2017-09-14 MED FILL — HYDROCHLOROTHIAZIDE 25 MG T: 25 | 30 days supply | Qty: 30 | Fill #5

## 2017-09-14 MED FILL — ATORVASTATIN 40 MG TABLET: 40 | 30 days supply | Qty: 30 | Fill #3

## 2017-09-14 MED FILL — AMLODIPINE BESYLATE 10 MG T: 10 | 30 days supply | Qty: 30 | Fill #5

## 2017-10-27 ENCOUNTER — Telehealth: Payer: Self-pay | Admitting: Family Medicine

## 2017-10-27 ENCOUNTER — Ambulatory Visit: Payer: Medicare Other | Admitting: Nurse Practitioner

## 2017-10-27 NOTE — Telephone Encounter (Signed)
error 

## 2017-11-18 ENCOUNTER — Ambulatory Visit: Payer: Medicare Other | Admitting: Nurse Practitioner

## 2017-12-07 ENCOUNTER — Other Ambulatory Visit: Payer: Self-pay

## 2017-12-07 DIAGNOSIS — M545 Low back pain: Secondary | ICD-10-CM

## 2017-12-07 DIAGNOSIS — I10 Essential (primary) hypertension: Secondary | ICD-10-CM

## 2017-12-07 DIAGNOSIS — G8929 Other chronic pain: Secondary | ICD-10-CM

## 2017-12-07 DIAGNOSIS — M5137 Other intervertebral disc degeneration, lumbosacral region: Secondary | ICD-10-CM

## 2017-12-07 MED ORDER — CARVEDILOL 3.125 MG PO TABS: ORAL_TABLET | ORAL | 0 refills | Status: DC

## 2017-12-07 MED ORDER — HYDROCHLOROTHIAZIDE 25 MG PO TABS: 25.0000 mg | ORAL_TABLET | Freq: Every day | ORAL | 0 refills | Status: DC

## 2017-12-07 MED ORDER — AMLODIPINE BESYLATE 10 MG PO TABS
10.0000 mg | ORAL_TABLET | Freq: Every morning | ORAL | 0 refills | Status: DC
Start: 2017-12-07 — End: 2018-02-18

## 2017-12-15 MED FILL — AMLODIPINE BESYLATE 10 MG T: 10 | 30 days supply | Qty: 30 | Fill #0

## 2017-12-15 MED FILL — CARVEDILOL 3.125 MG TABLET: 3.125 | 30 days supply | Qty: 60 | Fill #0

## 2017-12-15 MED FILL — HYDROCHLOROTHIAZIDE 25 MG T: 25 | 30 days supply | Qty: 30 | Fill #0

## 2018-02-18 ENCOUNTER — Encounter: Payer: Self-pay | Admitting: Family Medicine

## 2018-02-18 ENCOUNTER — Ambulatory Visit: Payer: Self-pay | Attending: Family Medicine | Admitting: Family Medicine

## 2018-02-18 VITALS — BP 160/82 | HR 84 | Temp 97.5°F | Resp 18 | Ht 67.0 in | Wt 195.8 lb

## 2018-02-18 DIAGNOSIS — N183 Chronic kidney disease, stage 3 unspecified: Secondary | ICD-10-CM

## 2018-02-18 DIAGNOSIS — I129 Hypertensive chronic kidney disease with stage 1 through stage 4 chronic kidney disease, or unspecified chronic kidney disease: Secondary | ICD-10-CM | POA: Insufficient documentation

## 2018-02-18 DIAGNOSIS — I10 Essential (primary) hypertension: Secondary | ICD-10-CM

## 2018-02-18 DIAGNOSIS — I252 Old myocardial infarction: Secondary | ICD-10-CM | POA: Insufficient documentation

## 2018-02-18 DIAGNOSIS — M549 Dorsalgia, unspecified: Secondary | ICD-10-CM | POA: Insufficient documentation

## 2018-02-18 DIAGNOSIS — Z823 Family history of stroke: Secondary | ICD-10-CM | POA: Insufficient documentation

## 2018-02-18 DIAGNOSIS — Z79899 Other long term (current) drug therapy: Secondary | ICD-10-CM

## 2018-02-18 DIAGNOSIS — E782 Mixed hyperlipidemia: Secondary | ICD-10-CM

## 2018-02-18 DIAGNOSIS — I160 Hypertensive urgency: Secondary | ICD-10-CM

## 2018-02-18 DIAGNOSIS — Z8249 Family history of ischemic heart disease and other diseases of the circulatory system: Secondary | ICD-10-CM | POA: Insufficient documentation

## 2018-02-18 DIAGNOSIS — Z833 Family history of diabetes mellitus: Secondary | ICD-10-CM | POA: Insufficient documentation

## 2018-02-18 MED ORDER — AMLODIPINE BESYLATE 10 MG PO TABS: ORAL_TABLET | ORAL | 6 refills | Status: DC

## 2018-02-18 MED ORDER — HYDROCHLOROTHIAZIDE 25 MG PO TABS: 25.0000 mg | ORAL_TABLET | Freq: Every day | ORAL | 0 refills | Status: DC

## 2018-02-18 MED ORDER — LISINOPRIL 40 MG PO TABS: 40.0000 mg | ORAL_TABLET | Freq: Every day | ORAL | 6 refills | Status: DC

## 2018-02-18 MED ORDER — ATORVASTATIN CALCIUM 40 MG PO TABS: 40.0000 mg | ORAL_TABLET | Freq: Every day | ORAL | 11 refills | Status: DC

## 2018-02-18 MED ORDER — CARVEDILOL 3.125 MG PO TABS: ORAL_TABLET | ORAL | 6 refills | Status: DC

## 2018-02-18 MED ORDER — CLONIDINE HCL 0.1 MG PO TABS
0.1000 mg | ORAL_TABLET | Freq: Once | ORAL | Status: AC
  Administered 2018-02-18: 0.1 mg via ORAL

## 2018-02-18 MED FILL — LISINOPRIL 40 MG TABLET: 40 | 30 days supply | Qty: 30 | Fill #0

## 2018-02-18 MED FILL — AMLODIPINE BESYLATE 10 MG T: 10 | 30 days supply | Qty: 30 | Fill #0

## 2018-02-18 MED FILL — HYDROCHLOROTHIAZIDE 25 MG T: 25 | 30 days supply | Qty: 30 | Fill #0

## 2018-02-18 NOTE — Progress Notes (Signed)
Subjective:    Patient ID: Riley Wagner, male    DOB: 1955-11-18, 62 y.o.   MRN: [id number]  HPI 62 year old male who was seen to reestablish care.  Patient was last seen in the office on 02/13/2017 for follow-up of hypertension and hyperlipidemia.  At that time, patient was on lisinopril 40 mg, Coreg 3.125 mg twice daily, hydrochlorothiazide 25 mg daily and amlodipine 10 mg daily for control of blood pressure.  Patient was also on Lipitor 40 mg for hyperlipidemia.  Patient also received a refill of cyclobenzaprine 5 mg and tylenol for chronic low back pain due to degenerative disc disease.  At today's visit, patient reports that his back pain occurs off and on.  Patient does have a history of prior lumbar surgery per his report.  Patient states that he has been out of all blood pressure medicines for at least 10 days.  Patient apparently was out of the lisinopril, amlodipine and hydrochlorothiazide but he is not sure for how long but only ran out of the Coreg about 10 days ago.  Patient states that he is also out of Lipitor but he is not sure how long he has been without this medication but likely for several months.  Patient denies any headaches or dizziness related to his blood pressure.  Patient denies any chest pain or palpitations, no shortness of breath or cough.  Patient denies urinary frequency or dysuria.  Patient denies any peripheral edema.  Patient denies any issues with focal numbness or weakness.      Patient reports that he is not allergic to any medicines.  Patient reports that he has had prior back surgery.  Patient reports family history significant for his brother and mother with hypertension but patient states that his mother has also had 3 strokes.  Patient has had no episodes of focal numbness or weakness.  Patient denies any visual disturbance or loss of vision.       Past Medical History:  Diagnosis Date  . Hypertension   . MI (myocardial infarction) [facility])   Per chart- patient  also with hyperlipidemia and CKD Past Surgical History:  Procedure Laterality Date  . BACK SURGERY     2004   Family History  Problem Relation Age of Onset  . Hypertension Mother   . Diabetes Mother   . Stroke Mother   at today's visit, patient reports that his brother has hypertension No Known Allergies   Review of Systems  Constitutional: Negative for chills, diaphoresis, fatigue and fever.  HENT: Negative for mouth sores and trouble swallowing.   Respiratory: Negative for cough and shortness of breath.   Cardiovascular: Negative for chest pain, palpitations and leg swelling.  Gastrointestinal: Negative for abdominal pain and nausea.  Genitourinary: Negative for dysuria and frequency.  Musculoskeletal: Positive for back pain. Negative for arthralgias, gait problem and joint swelling.  Neurological: Negative for dizziness, weakness, numbness and headaches.       Objective:   Physical Exam BP (!) 160/82   Pulse 84   Temp (!) 97.5 F (36.4 C) (Oral)   Resp 18   Ht 5\' 7"  (1.702 m)   Wt 195 lb 12.8 oz (88.8 kg)   SpO2 99%   BMI 30.67 kg/m   Vital signs and nurse's notes reviewed. General-well-nourished, well-developed older gentleman in no acute distress.  Patient does have some difficulty getting onto the exam table and appears to have a slightly abnormal gait.  Patient is wearing glasses. Neck-supple, no lymphadenopathy, no  thyromegaly, no carotid bruit Lungs-clear to auscultation bilaterally Cardiovascular-regular rate and rhythm Abdomen-soft, mild truncal obesity, nontender Back-no CVA tenderness, patient with mild lumbosacral discomfort to palpation and patient has some lumbar paraspinous spasm Extremities-no edema Psych- patient with a somewhat flattened affect but patient may also have some impairment in intellectual ability       Assessment & Plan:  1. Essential hypertension Patient's blood pressure is greatly elevated at today's visit at 220/120 initially and  patient was given clonidine 0.1 mg x 1 which did lower patient's blood pressure.  Prescription refills were provided for patient's amlodipine, carvedilol and hydrochlorothiazide as well as lisinopril.  Importance of remaining compliant with his medication and not running out of medication was discussed with the patient at today's visit.  Patient will have CMP and lipid panel in follow-up of his hypertension. - Comprehensive metabolic panel - Lipid panel - amLODipine (NORVASC) 10 MG tablet; Take one pill one time daily to help lower blood pressure  Dispense: 30 tablet; Refill: 6 - atorvastatin (LIPITOR) 40 MG tablet; Take 1 tablet (40 mg total) by mouth daily. To help lower cholesterol  Dispense: 30 tablet; Refill: 11 - carvedilol (COREG) 3.125 MG tablet; TAKE ONE TABLET BY MOUTH TWICE DAY to help lower blood pressure  Dispense: 60 tablet; Refill: 6 - hydrochlorothiazide (HYDRODIURIL) 25 MG tablet; Take 1 tablet (25 mg total) by mouth daily. To help lower blood pressure  Dispense: 30 tablet; Refill: 0 - lisinopril (PRINIVIL,ZESTRIL) 40 MG tablet; Take 1 tablet (40 mg total) by mouth daily. To help lower blood pressure  Dispense: 30 tablet; Refill: 6  2. CKD (chronic kidney disease) stage 3, GFR 30-59 ml/min (HCC) Patient with history of chronic kidney disease with a creatinine of 1.40 on BMP done in March of last year.  Patient will have CMP and CBC done in follow-up of his chronic kidney disease.  Discussed the importance of controlling blood pressure to help prevent progression of CKD - Comprehensive metabolic panel - CBC with Differential  3. Mixed hyperlipidemia Patient with a history of mixed hyperlipidemia and patient was given refill of Lipitor today's visit and patient will have CMP and lipid panel done at today's visit.  Patient's last lipid panel was in March of last year showing triglycerides elevated at 224, low HDL at 36 and LDL of 117.  Discussed LDL goal of 70 or less due to patient's  hypertension and chronic kidney disease. - Comprehensive metabolic panel - Lipid panel - atorvastatin (LIPITOR) 40 MG tablet; Take 1 tablet (40 mg total) by mouth daily. To help lower cholesterol  Dispense: 30 tablet; Refill: 11  4. Encounter for long-term current use of medication Patient will have CMP and CBC done in follow-up of long-term use of multiple medications including medications for blood pressure and statin therapy which may cause disturbances and electrolytes, kidney function or liver enzymes. - Comprehensive metabolic panel - CBC with Differential  5. Asymptomatic hypertensive urgency Patient with asymptomatic hypertensive urgency as patient's initial blood pressure today's visit upon recheck was 220/120.  Patient's blood pressure had initially been taken using a machine and blood pressure was abnormally low considering the fact that patient had been without his medication and CMA was asked to recheck blood pressure manually at which time it was discovered that patient's actual blood pressure was not within normal but was greatly elevated at 220/120.  Patient was given clonidine 0.1 mg here in the office which did reduce his blood pressure to 160/82.  Patient was  asked to pick up his medications after today's visit and restart use of medications today.  Patient was also asked to return for blood pressure recheck with nurse or clinical pharmacist within the next 2 weeks - cloNIDine (CATAPRES) tablet 0.1 mg  An After Visit Summary was printed and given to the patient.  Return in about 4 months (around 06/21/2018) for BP check with CPP in 2 weeks; Marland Kitchen

## 2018-02-19 LAB — COMPREHENSIVE METABOLIC PANEL WITH GFR
ALT: 21 IU/L (ref 0–44)
AST: 21 IU/L (ref 0–40)
Albumin/Globulin Ratio: 1.6 (ref 1.2–2.2)
Albumin: 4.4 g/dL (ref 3.6–4.8)
Alkaline Phosphatase: 79 IU/L (ref 39–117)
BUN/Creatinine Ratio: 10 (ref 10–24)
BUN: 14 mg/dL (ref 8–27)
Bilirubin Total: 0.2 mg/dL (ref 0.0–1.2)
CO2: 21 mmol/L (ref 20–29)
Calcium: 9.6 mg/dL (ref 8.6–10.2)
Chloride: 102 mmol/L (ref 96–106)
Creatinine, Ser: 1.44 mg/dL — ABNORMAL HIGH (ref 0.76–1.27)
GFR calc Af Amer: 59 mL/min/1.73 — ABNORMAL LOW
GFR calc non Af Amer: 51 mL/min/1.73 — ABNORMAL LOW
Globulin, Total: 2.7 g/dL (ref 1.5–4.5)
Glucose: 96 mg/dL (ref 65–99)
Potassium: 4 mmol/L (ref 3.5–5.2)
Sodium: 140 mmol/L (ref 134–144)
Total Protein: 7.1 g/dL (ref 6.0–8.5)

## 2018-02-19 LAB — CBC WITH DIFFERENTIAL/PLATELET
Basophils Absolute: 0.1 x10E3/uL (ref 0.0–0.2)
Basos: 1 %
EOS (ABSOLUTE): 0.2 x10E3/uL (ref 0.0–0.4)
Eos: 3 %
Hematocrit: 42.8 % (ref 37.5–51.0)
Hemoglobin: 13.9 g/dL (ref 13.0–17.7)
Immature Grans (Abs): 0 x10E3/uL (ref 0.0–0.1)
Immature Granulocytes: 0 %
Lymphocytes Absolute: 2.2 x10E3/uL (ref 0.7–3.1)
Lymphs: 39 %
MCH: 29.2 pg (ref 26.6–33.0)
MCHC: 32.5 g/dL (ref 31.5–35.7)
MCV: 90 fL (ref 79–97)
Monocytes Absolute: 0.6 x10E3/uL (ref 0.1–0.9)
Monocytes: 11 %
Neutrophils Absolute: 2.6 x10E3/uL (ref 1.4–7.0)
Neutrophils: 46 %
Platelets: 255 x10E3/uL (ref 150–450)
RBC: 4.76 x10E6/uL (ref 4.14–5.80)
RDW: 13.8 % (ref 12.3–15.4)
WBC: 5.6 x10E3/uL (ref 3.4–10.8)

## 2018-02-19 LAB — LIPID PANEL
Chol/HDL Ratio: 5.6 ratio — ABNORMAL HIGH (ref 0.0–5.0)
Cholesterol, Total: 231 mg/dL — ABNORMAL HIGH (ref 100–199)
HDL: 41 mg/dL
LDL Calculated: 153 mg/dL — ABNORMAL HIGH (ref 0–99)
Triglycerides: 184 mg/dL — ABNORMAL HIGH (ref 0–149)
VLDL Cholesterol Cal: 37 mg/dL (ref 5–40)

## 2018-02-24 ENCOUNTER — Telehealth: Payer: Self-pay

## 2018-02-24 NOTE — Telephone Encounter (Signed)
-----   Message from Antony Blackbird, MD sent at 02/19/2018  8:54 AM EDT ----- Please notify patient that his CBC was normal.  Patient with continued elevation in serum creatinine consistent with chronic kidney disease which appears to be stable.  Patient with elevated triglycerides at 154 with normal being 150 or less, patient with LDL of 153 and patient's goal is to be at 70 or less secondary to history of heart attack.  Patient needs to take his cholesterol medication on a daily basis.  Please find out if patient needs refill of cholesterol medication or new prescription

## 2018-02-24 NOTE — Telephone Encounter (Signed)
Medical Assistant left message on patient's home and cell voicemail. Voicemail states to give a call back to [place] with [facility] at [phone]. !!!Please inform patient of blood count being normal and chronic kidney disease being normal. Patient should be taking cholesterol medication daily. A refill will be placed if patient needs one!!!

## 2018-02-24 NOTE — Telephone Encounter (Signed)
A lady answered.  I asked if Riley Wagner was available.  She said no and immediately hung up.

## 2018-03-04 ENCOUNTER — Encounter: Payer: Self-pay | Admitting: Pharmacist

## 2018-03-11 ENCOUNTER — Ambulatory Visit: Payer: Self-pay | Attending: Family Medicine | Admitting: Pharmacist

## 2018-03-11 VITALS — BP 154/97 | HR 97

## 2018-03-11 DIAGNOSIS — Z79899 Other long term (current) drug therapy: Secondary | ICD-10-CM | POA: Insufficient documentation

## 2018-03-11 DIAGNOSIS — I10 Essential (primary) hypertension: Secondary | ICD-10-CM

## 2018-03-11 MED ORDER — HYDROCHLOROTHIAZIDE 25 MG PO TABS: 25.0000 mg | ORAL_TABLET | Freq: Every day | ORAL | 2 refills | Status: DC

## 2018-03-11 MED FILL — AMLODIPINE BESYLATE 10 MG T: 10 | 30 days supply | Qty: 30 | Fill #1

## 2018-03-11 MED FILL — CARVEDILOL 3.125 MG TABLET: 3.125 | 30 days supply | Qty: 60 | Fill #0

## 2018-03-11 MED FILL — LISINOPRIL 40 MG TABLET: 40 | 30 days supply | Qty: 30 | Fill #1

## 2018-03-11 MED FILL — ATORVASTATIN 40 MG TABLET: 40 | 30 days supply | Qty: 30 | Fill #0

## 2018-03-11 MED FILL — HYDROCHLOROTHIAZIDE 25 MG T: 25 | 30 days supply | Qty: 30 | Fill #0

## 2018-03-11 NOTE — Progress Notes (Signed)
   S:    PCP: Dr. Chapman Fitch  Patient arrives in good spirits. Presents to the clinic for hypertension management. Patient was referred and last seen by Dr. Chapman Fitch on 02/18/18. BP at that time 220/120. Clonidine 0.1 mg given in clinic. BP dropped to 160/82. Medications were continued.  Patient denies adherence with medications.  Current BP Medications include:   - amlodipine 10 mg daily - carvedilol 3.125 mg BID. Reports not taking. - HCTZ 25 mg daily - lisinopril 40 mg daily  Antihypertensives tried in the past include:  - amlodipine 5 mg daily - carvedilol 6.25 mg BID - lisinopril 30 mg daily  Dietary habits include:  - limits salt - limits caffeine  Exercise habits include: - Minimal exercise; walks 1-2 days of the week Family / Social history:  - FH: DM, HTN (mother) - Tobacco: former smoker - Alcohol: occasional use  Home BP readings:  - does not take at home  O:  L arm after 5 minutes rest: 154/97, HR 97  Last 3 Office BP readings: BP Readings from Last 3 Encounters:  02/18/18 (!) 160/82  02/13/17 138/80  12/26/16 127/80   BMET    Component Value Date/Time   NA 140 02/18/2018 1500   K 4.0 02/18/2018 1500   CL 102 02/18/2018 1500   CO2 21 02/18/2018 1500   GLUCOSE 96 02/18/2018 1500   GLUCOSE 110 (H) 12/22/2014 1742   BUN 14 02/18/2018 1500   CREATININE 1.44 (H) 02/18/2018 1500   CREATININE 1.51 (H) 12/22/2014 1742   CALCIUM 9.6 02/18/2018 1500   GFRNONAA 51 (L) 02/18/2018 1500   GFRNONAA 49 (L) 12/22/2014 1742   GFRAA 59 (L) 02/18/2018 1500   GFRAA 57 (L) 12/22/2014 1742   Renal function: CrCl cannot be calculated (Unknown ideal weight.).  A/P: Hypertension longstanding currently uncontrolled on current medications. BP Goal <130/80 mmHg. Patient is not adherent with current medications.  -Continued anti-hypertensives. Pt to pick up carvedilol today from pharmacy. -F/u labs ordered - none -Counseled on lifestyle modifications for blood pressure control  including reduced dietary sodium, increased exercise, adequate sleep  Results reviewed and written information provided.   Total time in face-to-face counseling 20 minutes.   F/U Clinic Visit in 1 month.    Patient seen with: Dixon Boos, PharmD Candidate [employer]  Benard Halsted, PharmD, [place] [phone]

## 2018-03-11 NOTE — Patient Instructions (Signed)
Thank you for coming to see [place] today.   Blood pressure today is improving.  Take 1 tablet of lisinopril 40 mg in the morning. Take 1 tablet of HCTZ 25 mg in the morning.  Take 1 tablet of carvedilol 3.125 mg in the morning.   Take 1 tablet of carvedilol 3.125 mg in the evening.  Take 1 tablet of amlodipine 10 mg daily in the evening.  Limiting salt and caffeine, as well as exercising as able for at least 30 minutes for 5 days out of the week, can also help you lower your blood pressure.  Take your blood pressure at home if you are able. Please write down these numbers and bring them to your visits.  If you have any questions about medications, please call me [phone].  Lurena Joiner

## 2018-04-12 ENCOUNTER — Ambulatory Visit: Payer: Self-pay | Admitting: Pharmacist

## 2018-06-15 MED FILL — AMLODIPINE BESYLATE 10 MG T: 10 | 30 days supply | Qty: 30 | Fill #2

## 2018-06-15 MED FILL — HYDROCHLOROTHIAZIDE 25 MG T: 25 | 30 days supply | Qty: 30 | Fill #1

## 2018-06-15 MED FILL — CARVEDILOL 3.125 MG TABLET: 3.125 | 30 days supply | Qty: 60 | Fill #1

## 2018-06-15 MED FILL — LISINOPRIL 40 MG TABLET: 40 | 30 days supply | Qty: 30 | Fill #2

## 2018-06-15 MED FILL — ATORVASTATIN 40 MG TABLET: 40 | 30 days supply | Qty: 30 | Fill #1

## 2018-06-22 ENCOUNTER — Ambulatory Visit: Payer: Self-pay | Admitting: Family Medicine

## 2018-07-27 MED FILL — LISINOPRIL 40 MG TABLET: 40 | 30 days supply | Qty: 30 | Fill #3

## 2018-07-27 MED FILL — CARVEDILOL 3.125 MG TABLET: 3.125 | 30 days supply | Qty: 60 | Fill #2

## 2018-07-27 MED FILL — AMLODIPINE BESYLATE 10 MG T: 10 | 30 days supply | Qty: 30 | Fill #3

## 2018-07-27 MED FILL — ATORVASTATIN 40 MG TABLET: 40 | 30 days supply | Qty: 30 | Fill #2

## 2018-07-27 MED FILL — HYDROCHLOROTHIAZIDE 25 MG T: 25 | 30 days supply | Qty: 30 | Fill #2

## 2018-09-07 ENCOUNTER — Other Ambulatory Visit: Payer: Self-pay | Admitting: Family Medicine

## 2018-09-07 DIAGNOSIS — I10 Essential (primary) hypertension: Secondary | ICD-10-CM

## 2018-09-07 MED FILL — CARVEDILOL 3.125 MG TABLET: 3.125 | 30 days supply | Qty: 60 | Fill #3

## 2018-09-07 MED FILL — HYDROCHLOROTHIAZIDE 25 MG T: 25 | 30 days supply | Qty: 30 | Fill #0

## 2018-09-07 MED FILL — AMLODIPINE BESYLATE 10 MG T: 10 | 30 days supply | Qty: 30 | Fill #4

## 2018-09-07 MED FILL — LISINOPRIL 40 MG TABLET: 40 | 30 days supply | Qty: 30 | Fill #4

## 2018-09-07 MED FILL — ATORVASTATIN 40 MG TABLET: 40 | 30 days supply | Qty: 30 | Fill #3

## 2018-10-19 ENCOUNTER — Other Ambulatory Visit: Payer: Self-pay | Admitting: Family Medicine

## 2018-10-19 DIAGNOSIS — I10 Essential (primary) hypertension: Secondary | ICD-10-CM

## 2018-10-19 MED FILL — ?ATORVASTATIN 40MG TABLET: 40 | 30 days supply | Qty: 30 | Fill #4

## 2018-10-19 MED FILL — ?AMLODIPINE BESYLATE 10 MG: 10 | 30 days supply | Qty: 30 | Fill #5

## 2018-10-19 MED FILL — LISINOPRIL 40 MG TABLET: 40 | 30 days supply | Qty: 30 | Fill #5

## 2018-10-19 MED FILL — ?CARVEDILOL 3.125 MG TABLET: 3.125 | 30 days supply | Qty: 60 | Fill #4

## 2018-12-07 ENCOUNTER — Other Ambulatory Visit: Payer: Self-pay | Admitting: Family Medicine

## 2018-12-07 DIAGNOSIS — I10 Essential (primary) hypertension: Secondary | ICD-10-CM

## 2018-12-07 MED FILL — ?CARVEDILOL 3.125 MG TABLET: 3.125 | 30 days supply | Qty: 60 | Fill #5

## 2018-12-07 MED FILL — LISINOPRIL 40 MG TABLET: 40 | 30 days supply | Qty: 30 | Fill #6

## 2018-12-07 MED FILL — ?ATORVASTATIN 40MG TABLET: 40 | 30 days supply | Qty: 30 | Fill #5

## 2018-12-07 MED FILL — ?AMLODIPINE BESYLATE 10 MG: 10 | 30 days supply | Qty: 30 | Fill #6

## 2018-12-10 ENCOUNTER — Other Ambulatory Visit: Payer: Self-pay | Admitting: Family Medicine

## 2018-12-10 DIAGNOSIS — I10 Essential (primary) hypertension: Secondary | ICD-10-CM

## 2018-12-13 ENCOUNTER — Other Ambulatory Visit: Payer: Self-pay | Admitting: Family Medicine

## 2018-12-13 DIAGNOSIS — I10 Essential (primary) hypertension: Secondary | ICD-10-CM

## 2019-03-09 ENCOUNTER — Telehealth: Payer: Self-pay | Admitting: Family Medicine

## 2019-03-09 DIAGNOSIS — E782 Mixed hyperlipidemia: Secondary | ICD-10-CM

## 2019-03-09 DIAGNOSIS — I10 Essential (primary) hypertension: Secondary | ICD-10-CM

## 2019-03-09 MED ORDER — AMLODIPINE BESYLATE 10 MG PO TABS: ORAL_TABLET | ORAL | 0 refills | Status: DC

## 2019-03-09 MED ORDER — LISINOPRIL 40 MG PO TABS: 40.0000 mg | ORAL_TABLET | Freq: Every day | ORAL | 0 refills | Status: DC

## 2019-03-09 MED ORDER — CARVEDILOL 3.125 MG PO TABS: ORAL_TABLET | ORAL | 0 refills | Status: DC

## 2019-03-09 MED ORDER — ATORVASTATIN CALCIUM 40 MG PO TABS: 40.0000 mg | ORAL_TABLET | Freq: Every day | ORAL | 0 refills | Status: DC

## 2019-03-09 MED ORDER — HYDROCHLOROTHIAZIDE 25 MG PO TABS: 25.0000 mg | ORAL_TABLET | Freq: Every day | ORAL | 0 refills | Status: DC

## 2019-03-09 MED FILL — ?ATORVASTATIN 40MG TABLET: 40 | 30 days supply | Qty: 30 | Fill #0

## 2019-03-09 MED FILL — LISINOPRIL 40 MG TABLET: 40 | 30 days supply | Qty: 30 | Fill #0

## 2019-03-09 MED FILL — ?HYDROCHLOROTHIAZIDE 25MG T: 25 | 30 days supply | Qty: 30 | Fill #0

## 2019-03-09 MED FILL — ?AMLODIPINE BESYLATE 10 MG: 10 | 30 days supply | Qty: 30 | Fill #0

## 2019-03-09 MED FILL — ?CARVEDILOL 3.125 MG TABLET: 3.125 | 30 days supply | Qty: 60 | Fill #0

## 2019-03-09 NOTE — Telephone Encounter (Signed)
Will provide 1 month refill to hold patient over until his upcoming appointment.

## 2019-03-09 NOTE — Telephone Encounter (Signed)
1) Medication(s) Requested (by name): -amLODipine (NORVASC) 10 MG tablet  -atorvastatin (LIPITOR) 40 MG tablet  -carvedilol (COREG) 3.125 MG tablet  -hydrochlorothiazide (HYDRODIURIL) 25 MG tablet  -lisinopril (PRINIVIL,ZESTRIL) 40 MG tablet   2) Pharmacy of Choice: -[place], [address]  3) Special Requests: Until his next appt 03/23/2019

## 2019-03-23 ENCOUNTER — Encounter: Payer: Self-pay | Admitting: Family Medicine

## 2019-03-23 ENCOUNTER — Other Ambulatory Visit: Payer: Self-pay

## 2019-03-23 ENCOUNTER — Ambulatory Visit: Payer: Self-pay | Attending: Family Medicine | Admitting: Family Medicine

## 2019-03-23 DIAGNOSIS — N183 Chronic kidney disease, stage 3 unspecified: Secondary | ICD-10-CM

## 2019-03-23 DIAGNOSIS — Z7982 Long term (current) use of aspirin: Secondary | ICD-10-CM

## 2019-03-23 DIAGNOSIS — I10 Essential (primary) hypertension: Secondary | ICD-10-CM

## 2019-03-23 DIAGNOSIS — E782 Mixed hyperlipidemia: Secondary | ICD-10-CM

## 2019-03-23 DIAGNOSIS — Z79899 Other long term (current) drug therapy: Secondary | ICD-10-CM

## 2019-03-23 MED ORDER — ATORVASTATIN CALCIUM 40 MG PO TABS: 40.0000 mg | ORAL_TABLET | Freq: Every day | ORAL | 3 refills | Status: DC

## 2019-03-23 MED ORDER — LISINOPRIL 40 MG PO TABS: 40.0000 mg | ORAL_TABLET | Freq: Every day | ORAL | 1 refills | Status: DC

## 2019-03-23 MED ORDER — AMLODIPINE BESYLATE 10 MG PO TABS: ORAL_TABLET | ORAL | 1 refills | Status: DC

## 2019-03-23 MED ORDER — CARVEDILOL 3.125 MG PO TABS: ORAL_TABLET | ORAL | 1 refills | Status: DC

## 2019-03-23 MED ORDER — HYDROCHLOROTHIAZIDE 25 MG PO TABS: 25.0000 mg | ORAL_TABLET | Freq: Every day | ORAL | 1 refills | Status: DC

## 2019-03-23 MED FILL — ?HYDROCHLOROTHIAZIDE 25MG T: 25 | 30 days supply | Qty: 30 | Fill #0

## 2019-03-23 MED FILL — LISINOPRIL 40 MG TABLET: 40 | 30 days supply | Qty: 30 | Fill #0

## 2019-03-23 MED FILL — ?CARVEDILOL 3.125 MG TABLET: 3.125 | 30 days supply | Qty: 60 | Fill #0

## 2019-03-23 MED FILL — ?ATORVASTATIN 40MG TABLET: 40 | 30 days supply | Qty: 30 | Fill #0

## 2019-03-23 MED FILL — ?AMLODIPINE BESYLATE 10 MG: 10 | 30 days supply | Qty: 30 | Fill #0

## 2019-03-23 NOTE — Progress Notes (Signed)
Patient verified DOB Patient has not eaten today. Patient has not taken medication today. Patient denies pain at this time. BP at home is checked 1 a week.'

## 2019-03-23 NOTE — Progress Notes (Signed)
Virtual Visit via Telephone Note  I connected with Riley Wagner on 03/23/19 at 10:10 AM EST by telephone and verified that I am speaking with the correct person using two identifiers.   I discussed the limitations, risks, security and privacy concerns of performing an evaluation and management service by telephone and the availability of in person appointments. I also discussed with the patient that there may be a patient responsible charge related to this service. The patient expressed understanding and agreed to proceed.  Patient Location: home Provider Location: [facility] Others participating in call: Call initiated by [place], CMA who then transferred the call to me.   History of Present Illness:        63 year old male with hypertension, hyperlipidemia and chronic kidney disease who reports that he is currently out of medications for about 2 days and needs a refill of all medications sent to his home.  He denies any headaches or dizziness related to his blood pressure.  He denies increased muscle or joint pain with the use of cholesterol medicine.  He is trying to remain hydrated due to his chronic kidney disease.  He reports no acute issues other than being out of his medication.  Review of chart, patient did recently follow-up with clinical pharmacist regarding his high blood pressure.  Blood pressure was still slightly elevated but patient was not taking Coreg.  Patient states that he is now picked up all medications.  He does try to monitor his blood pressure at home.  He states blood pressures are generally 140s over 90s or less but higher since her medication.  He denies any chest pain or palpitations, no shortness of breath or cough, no headache or dizziness, no sore throat or difficulty swallowing.  No abdominal pain-no nausea or vomiting.  He continues to take a daily baby aspirin and denies any blood in the stool or black stools.   Past Medical History:  Diagnosis Date  .  Hypertension   . MI (myocardial infarction) [facility])     Past Surgical History:  Procedure Laterality Date  . BACK SURGERY     2004    Family History  Problem Relation Age of Onset  . Hypertension Mother   . Diabetes Mother   . Stroke Mother     Social History   Tobacco Use  . Smoking status: Former Smoker    Packs/day: 0.20    Years: 25.00    Pack years: 5.00    Types: Cigarettes    Quit date: 06/21/2014    Years since quitting: 4.7  . Smokeless tobacco: Never Used  Substance Use Topics  . Alcohol use: Yes    Alcohol/week: 0.0 standard drinks    Comment: occasionally  . Drug use: No     No Known Allergies     Observations/Objective: No vital signs or physical exam conducted as visit was done via telephone  Assessment and Plan: 1. Essential hypertension Patient reports that he is currently out of medications and cannot come into the pharmacy to pick them up therefore prescriptions placed for mail order with note to pharmacy the patient is currently out of medication.  Patient will continue use of amlodipine, carvedilol, hydrochlorothiazide and lisinopril.  Patient is to continue home monitoring as well as DASH/low-sodium diet.  Patient has been asked to come into the office in the next few weeks to have blood work including comprehensive metabolic panel and lipid panel in follow-up of hypertension, use of statin therapy and chronic  kidney disease. - Comprehensive metabolic panel; Future - Lipid panel; Future - amLODipine (NORVASC) 10 MG tablet; Take one pill one time daily to help lower blood pressure  Dispense: 90 tablet; Refill: 1 - carvedilol (COREG) 3.125 MG tablet; TAKE ONE TABLET BY MOUTH TWICE DAY to help lower blood pressure  Dispense: 180 tablet; Refill: 1 - hydrochlorothiazide (HYDRODIURIL) 25 MG tablet; Take 1 tablet (25 mg total) by mouth daily. To help lower blood pressure  Dispense: 90 tablet; Refill: 1 - lisinopril (ZESTRIL) 40 MG tablet; Take 1 tablet (40 mg  total) by mouth daily. To help lower blood pressure  Dispense: 90 tablet; Refill: 1  2. Stage 3 chronic kidney disease, unspecified whether stage 3a or 3b CKD Patient has not had creatinine done since 02/28/2018 when it was elevated at 1.44 with EGFR of 59.  He has been asked to return to clinic to have repeat creatinine and electrolytes in follow-up of stage III chronic kidney disease. - Comprehensive metabolic panel; Future  3. Mixed hyperlipidemia Refill sent to patient's pharmacy for atorvastatin and patient is to come into the office to have repeat lipid panel and LFTs in follow-up of his use of statin medication.  Last lipid panel was done 02/18/2018 and at that time patient with triglycerides elevated at 184 and LDL of 153.  Continue low-fat diet. - Comprehensive metabolic panel; Future - Lipid panel; Future  4. Encounter for long-term current use of medication; 5.  Long-term use of aspirin therapy Patient will come into the office to have comprehensive metabolic panel in follow-up of long-term use of multiple blood pressure medications and statin therapy as well as CBC in follow-up of chronic kidney disease and long-term use of aspirin - Comprehensive metabolic panel; Future - CBC  Follow Up Instructions:Return in about 3 months (around 06/23/2019) for HTN/lipids-labs in 2-3 weeks.    I discussed the assessment and treatment plan with the patient. The patient was provided an opportunity to ask questions and all were answered. The patient agreed with the plan and demonstrated an understanding of the instructions.   The patient was advised to call back or seek an in-person evaluation if the symptoms worsen or if the condition fails to improve as anticipated.  I provided 12 minutes of non-face-to-face time during this encounter.   Antony Blackbird, MD

## 2019-03-30 ENCOUNTER — Ambulatory Visit: Payer: Self-pay | Admitting: Family Medicine

## 2019-10-02 ENCOUNTER — Encounter (HOSPITAL_COMMUNITY)
Admission: EM | Disposition: A | Discharge status: Nursing Home (Includes ICF) | Payer: Self-pay | Source: Home / Self Care | Attending: Neurology

## 2019-10-02 ENCOUNTER — Inpatient Hospital Stay (HOSPITAL_COMMUNITY): Payer: Medicare Other

## 2019-10-02 ENCOUNTER — Inpatient Hospital Stay (HOSPITAL_COMMUNITY)
Admission: EM | Admit: 2019-10-02 | Discharge: 2019-10-26 | DRG: 003 | Disposition: A | Discharge status: Other Acute Inpatient Hospital | Payer: Medicare Other | Attending: Critical Care Medicine | Admitting: Critical Care Medicine

## 2019-10-02 ENCOUNTER — Emergency Department (HOSPITAL_COMMUNITY): Payer: Medicare Other

## 2019-10-02 ENCOUNTER — Emergency Department (HOSPITAL_COMMUNITY): Payer: Medicare Other | Admitting: Certified Registered Nurse Anesthetist

## 2019-10-02 DIAGNOSIS — I63511 Cerebral infarction due to unspecified occlusion or stenosis of right middle cerebral artery: Secondary | ICD-10-CM | POA: Diagnosis not present

## 2019-10-02 DIAGNOSIS — I619 Nontraumatic intracerebral hemorrhage, unspecified: Secondary | ICD-10-CM | POA: Diagnosis not present

## 2019-10-02 DIAGNOSIS — K2091 Esophagitis, unspecified with bleeding: Secondary | ICD-10-CM | POA: Diagnosis present

## 2019-10-02 DIAGNOSIS — R1310 Dysphagia, unspecified: Secondary | ICD-10-CM | POA: Diagnosis present

## 2019-10-02 DIAGNOSIS — T17908A Unspecified foreign body in respiratory tract, part unspecified causing other injury, initial encounter: Secondary | ICD-10-CM | POA: Diagnosis not present

## 2019-10-02 DIAGNOSIS — A419 Sepsis, unspecified organism: Secondary | ICD-10-CM | POA: Diagnosis not present

## 2019-10-02 DIAGNOSIS — J9602 Acute respiratory failure with hypercapnia: Secondary | ICD-10-CM | POA: Diagnosis present

## 2019-10-02 DIAGNOSIS — T17908S Unspecified foreign body in respiratory tract, part unspecified causing other injury, sequela: Secondary | ICD-10-CM | POA: Diagnosis not present

## 2019-10-02 DIAGNOSIS — G8101 Flaccid hemiplegia affecting right dominant side: Secondary | ICD-10-CM | POA: Diagnosis present

## 2019-10-02 DIAGNOSIS — I161 Hypertensive emergency: Secondary | ICD-10-CM | POA: Diagnosis present

## 2019-10-02 DIAGNOSIS — R29810 Facial weakness: Secondary | ICD-10-CM | POA: Diagnosis present

## 2019-10-02 DIAGNOSIS — Z930 Tracheostomy status: Secondary | ICD-10-CM | POA: Diagnosis not present

## 2019-10-02 DIAGNOSIS — N183 Chronic kidney disease, stage 3 unspecified: Secondary | ICD-10-CM | POA: Diagnosis present

## 2019-10-02 DIAGNOSIS — I6200 Nontraumatic subdural hemorrhage, unspecified: Secondary | ICD-10-CM | POA: Diagnosis present

## 2019-10-02 DIAGNOSIS — R059 Cough, unspecified: Secondary | ICD-10-CM

## 2019-10-02 DIAGNOSIS — J9601 Acute respiratory failure with hypoxia: Secondary | ICD-10-CM

## 2019-10-02 DIAGNOSIS — R0603 Acute respiratory distress: Secondary | ICD-10-CM

## 2019-10-02 DIAGNOSIS — I63413 Cerebral infarction due to embolism of bilateral middle cerebral arteries: Secondary | ICD-10-CM | POA: Diagnosis present

## 2019-10-02 DIAGNOSIS — Z978 Presence of other specified devices: Secondary | ICD-10-CM | POA: Diagnosis not present

## 2019-10-02 DIAGNOSIS — R4701 Aphasia: Secondary | ICD-10-CM | POA: Diagnosis present

## 2019-10-02 DIAGNOSIS — Z20822 Contact with and (suspected) exposure to covid-19: Secondary | ICD-10-CM | POA: Diagnosis present

## 2019-10-02 DIAGNOSIS — I639 Cerebral infarction, unspecified: Secondary | ICD-10-CM | POA: Diagnosis not present

## 2019-10-02 DIAGNOSIS — F1410 Cocaine abuse, uncomplicated: Secondary | ICD-10-CM | POA: Diagnosis present

## 2019-10-02 DIAGNOSIS — Z66 Do not resuscitate: Secondary | ICD-10-CM | POA: Diagnosis not present

## 2019-10-02 DIAGNOSIS — R1312 Dysphagia, oropharyngeal phase: Secondary | ICD-10-CM | POA: Diagnosis not present

## 2019-10-02 DIAGNOSIS — Z781 Physical restraint status: Secondary | ICD-10-CM

## 2019-10-02 DIAGNOSIS — I950 Idiopathic hypotension: Secondary | ICD-10-CM

## 2019-10-02 DIAGNOSIS — I6389 Other cerebral infarction: Secondary | ICD-10-CM | POA: Diagnosis not present

## 2019-10-02 DIAGNOSIS — Z9911 Dependence on respirator [ventilator] status: Secondary | ICD-10-CM | POA: Diagnosis not present

## 2019-10-02 DIAGNOSIS — R29725 NIHSS score 25: Secondary | ICD-10-CM | POA: Diagnosis present

## 2019-10-02 DIAGNOSIS — E876 Hypokalemia: Secondary | ICD-10-CM | POA: Diagnosis not present

## 2019-10-02 DIAGNOSIS — Z79899 Other long term (current) drug therapy: Secondary | ICD-10-CM

## 2019-10-02 DIAGNOSIS — E874 Mixed disorder of acid-base balance: Secondary | ICD-10-CM | POA: Diagnosis present

## 2019-10-02 DIAGNOSIS — Z8673 Personal history of transient ischemic attack (TIA), and cerebral infarction without residual deficits: Secondary | ICD-10-CM | POA: Diagnosis not present

## 2019-10-02 DIAGNOSIS — R197 Diarrhea, unspecified: Secondary | ICD-10-CM | POA: Diagnosis not present

## 2019-10-02 DIAGNOSIS — I959 Hypotension, unspecified: Secondary | ICD-10-CM | POA: Diagnosis not present

## 2019-10-02 DIAGNOSIS — J9600 Acute respiratory failure, unspecified whether with hypoxia or hypercapnia: Secondary | ICD-10-CM | POA: Diagnosis not present

## 2019-10-02 DIAGNOSIS — I9589 Other hypotension: Secondary | ICD-10-CM | POA: Diagnosis not present

## 2019-10-02 DIAGNOSIS — G936 Cerebral edema: Secondary | ICD-10-CM | POA: Diagnosis not present

## 2019-10-02 DIAGNOSIS — R509 Fever, unspecified: Secondary | ICD-10-CM

## 2019-10-02 DIAGNOSIS — Z430 Encounter for attention to tracheostomy: Secondary | ICD-10-CM | POA: Diagnosis not present

## 2019-10-02 DIAGNOSIS — I169 Hypertensive crisis, unspecified: Secondary | ICD-10-CM | POA: Diagnosis not present

## 2019-10-02 DIAGNOSIS — D72829 Elevated white blood cell count, unspecified: Secondary | ICD-10-CM | POA: Diagnosis not present

## 2019-10-02 DIAGNOSIS — Z87891 Personal history of nicotine dependence: Secondary | ICD-10-CM

## 2019-10-02 DIAGNOSIS — E785 Hyperlipidemia, unspecified: Secondary | ICD-10-CM | POA: Diagnosis present

## 2019-10-02 DIAGNOSIS — I610 Nontraumatic intracerebral hemorrhage in hemisphere, subcortical: Secondary | ICD-10-CM | POA: Diagnosis present

## 2019-10-02 DIAGNOSIS — E878 Other disorders of electrolyte and fluid balance, not elsewhere classified: Secondary | ICD-10-CM | POA: Diagnosis present

## 2019-10-02 DIAGNOSIS — Z0189 Encounter for other specified special examinations: Secondary | ICD-10-CM

## 2019-10-02 DIAGNOSIS — I252 Old myocardial infarction: Secondary | ICD-10-CM

## 2019-10-02 DIAGNOSIS — I672 Cerebral atherosclerosis: Secondary | ICD-10-CM | POA: Diagnosis present

## 2019-10-02 DIAGNOSIS — I609 Nontraumatic subarachnoid hemorrhage, unspecified: Secondary | ICD-10-CM | POA: Diagnosis present

## 2019-10-02 DIAGNOSIS — S0291XA Unspecified fracture of skull, initial encounter for closed fracture: Secondary | ICD-10-CM | POA: Diagnosis not present

## 2019-10-02 DIAGNOSIS — R6521 Severe sepsis with septic shock: Secondary | ICD-10-CM | POA: Diagnosis not present

## 2019-10-02 DIAGNOSIS — W19XXXA Unspecified fall, initial encounter: Secondary | ICD-10-CM | POA: Diagnosis present

## 2019-10-02 DIAGNOSIS — I63132 Cerebral infarction due to embolism of left carotid artery: Secondary | ICD-10-CM | POA: Diagnosis not present

## 2019-10-02 DIAGNOSIS — J690 Pneumonitis due to inhalation of food and vomit: Secondary | ICD-10-CM | POA: Diagnosis not present

## 2019-10-02 DIAGNOSIS — E870 Hyperosmolality and hypernatremia: Secondary | ICD-10-CM | POA: Diagnosis not present

## 2019-10-02 DIAGNOSIS — R7989 Other specified abnormal findings of blood chemistry: Secondary | ICD-10-CM | POA: Diagnosis not present

## 2019-10-02 DIAGNOSIS — R918 Other nonspecific abnormal finding of lung field: Secondary | ICD-10-CM | POA: Diagnosis not present

## 2019-10-02 DIAGNOSIS — J9811 Atelectasis: Secondary | ICD-10-CM | POA: Diagnosis present

## 2019-10-02 DIAGNOSIS — J9621 Acute and chronic respiratory failure with hypoxia: Secondary | ICD-10-CM | POA: Diagnosis not present

## 2019-10-02 DIAGNOSIS — Z823 Family history of stroke: Secondary | ICD-10-CM

## 2019-10-02 DIAGNOSIS — I130 Hypertensive heart and chronic kidney disease with heart failure and stage 1 through stage 4 chronic kidney disease, or unspecified chronic kidney disease: Secondary | ICD-10-CM | POA: Diagnosis not present

## 2019-10-02 DIAGNOSIS — I5042 Chronic combined systolic (congestive) and diastolic (congestive) heart failure: Secondary | ICD-10-CM | POA: Diagnosis present

## 2019-10-02 DIAGNOSIS — Z833 Family history of diabetes mellitus: Secondary | ICD-10-CM

## 2019-10-02 DIAGNOSIS — H109 Unspecified conjunctivitis: Secondary | ICD-10-CM | POA: Diagnosis not present

## 2019-10-02 DIAGNOSIS — K5900 Constipation, unspecified: Secondary | ICD-10-CM | POA: Diagnosis not present

## 2019-10-02 DIAGNOSIS — N179 Acute kidney failure, unspecified: Secondary | ICD-10-CM | POA: Diagnosis not present

## 2019-10-02 DIAGNOSIS — R739 Hyperglycemia, unspecified: Secondary | ICD-10-CM | POA: Diagnosis present

## 2019-10-02 DIAGNOSIS — Z01818 Encounter for other preprocedural examination: Secondary | ICD-10-CM

## 2019-10-02 DIAGNOSIS — I2510 Atherosclerotic heart disease of native coronary artery without angina pectoris: Secondary | ICD-10-CM | POA: Diagnosis present

## 2019-10-02 DIAGNOSIS — Z4682 Encounter for fitting and adjustment of non-vascular catheter: Secondary | ICD-10-CM | POA: Diagnosis not present

## 2019-10-02 DIAGNOSIS — J988 Other specified respiratory disorders: Secondary | ICD-10-CM

## 2019-10-02 DIAGNOSIS — R404 Transient alteration of awareness: Secondary | ICD-10-CM | POA: Diagnosis not present

## 2019-10-02 DIAGNOSIS — I6523 Occlusion and stenosis of bilateral carotid arteries: Secondary | ICD-10-CM | POA: Diagnosis not present

## 2019-10-02 DIAGNOSIS — Z8249 Family history of ischemic heart disease and other diseases of the circulatory system: Secondary | ICD-10-CM

## 2019-10-02 DIAGNOSIS — R55 Syncope and collapse: Secondary | ICD-10-CM | POA: Diagnosis not present

## 2019-10-02 DIAGNOSIS — R569 Unspecified convulsions: Secondary | ICD-10-CM | POA: Diagnosis not present

## 2019-10-02 DIAGNOSIS — J9690 Respiratory failure, unspecified, unspecified whether with hypoxia or hypercapnia: Secondary | ICD-10-CM | POA: Diagnosis not present

## 2019-10-02 DIAGNOSIS — J189 Pneumonia, unspecified organism: Secondary | ICD-10-CM

## 2019-10-02 DIAGNOSIS — N1831 Chronic kidney disease, stage 3a: Secondary | ICD-10-CM | POA: Diagnosis not present

## 2019-10-02 DIAGNOSIS — I63512 Cerebral infarction due to unspecified occlusion or stenosis of left middle cerebral artery: Secondary | ICD-10-CM | POA: Diagnosis not present

## 2019-10-02 DIAGNOSIS — I63232 Cerebral infarction due to unspecified occlusion or stenosis of left carotid arteries: Secondary | ICD-10-CM | POA: Diagnosis not present

## 2019-10-02 DIAGNOSIS — F419 Anxiety disorder, unspecified: Secondary | ICD-10-CM | POA: Diagnosis not present

## 2019-10-02 DIAGNOSIS — E7800 Pure hypercholesterolemia, unspecified: Secondary | ICD-10-CM | POA: Diagnosis not present

## 2019-10-02 DIAGNOSIS — H53461 Homonymous bilateral field defects, right side: Secondary | ICD-10-CM | POA: Diagnosis present

## 2019-10-02 DIAGNOSIS — R05 Cough: Secondary | ICD-10-CM | POA: Diagnosis not present

## 2019-10-02 HISTORY — PX: IR PERCUTANEOUS ART THROMBECTOMY/INFUSION INTRACRANIAL INC DIAG ANGIO: IMG6087

## 2019-10-02 HISTORY — PX: IR CT HEAD LTD: IMG2386

## 2019-10-02 HISTORY — PX: IR PTA INTRACRANIAL: IMG2344

## 2019-10-02 HISTORY — PX: IR US GUIDE VASC ACCESS RIGHT: IMG2390

## 2019-10-02 HISTORY — PX: RADIOLOGY WITH ANESTHESIA: SHX6223

## 2019-10-02 HISTORY — PX: IR ANGIOGRAM FOLLOW UP STUDY: IMG697

## 2019-10-02 HISTORY — PX: IR TRANSCATH/EMBOLIZ: IMG695

## 2019-10-02 LAB — I-STAT CHEM 8, ED
BUN: 28 mg/dL — ABNORMAL HIGH (ref 8–23)
Calcium, Ion: 1.14 mmol/L — ABNORMAL LOW (ref 1.15–1.40)
Chloride: 108 mmol/L (ref 98–111)
Creatinine, Ser: 1.5 mg/dL — ABNORMAL HIGH (ref 0.61–1.24)
Glucose, Bld: 110 mg/dL — ABNORMAL HIGH (ref 70–99)
HCT: 50 % (ref 39.0–52.0)
Hemoglobin: 17 g/dL (ref 13.0–17.0)
Potassium: 3.2 mmol/L — ABNORMAL LOW (ref 3.5–5.1)
Sodium: 144 mmol/L (ref 135–145)
TCO2: 28 mmol/L (ref 22–32)

## 2019-10-02 LAB — HIV ANTIBODY (ROUTINE TESTING W REFLEX): HIV Screen 4th Generation wRfx: NONREACTIVE

## 2019-10-02 LAB — POCT I-STAT 7, (LYTES, BLD GAS, ICA,H+H)
Acid-base deficit: 2 mmol/L (ref 0.0–2.0)
Bicarbonate: 24.2 mmol/L (ref 20.0–28.0)
Calcium, Ion: 1.22 mmol/L (ref 1.15–1.40)
HCT: 47 % (ref 39.0–52.0)
Hemoglobin: 16 g/dL (ref 13.0–17.0)
O2 Saturation: 88 %
Patient temperature: 98.2
Potassium: 3 mmol/L — ABNORMAL LOW (ref 3.5–5.1)
Sodium: 145 mmol/L (ref 135–145)
TCO2: 26 mmol/L (ref 22–32)
pCO2 arterial: 44.4 mmHg (ref 32.0–48.0)
pH, Arterial: 7.343 — ABNORMAL LOW (ref 7.350–7.450)
pO2, Arterial: 58 mmHg — ABNORMAL LOW (ref 83.0–108.0)

## 2019-10-02 LAB — COMPREHENSIVE METABOLIC PANEL
ALT: 28 U/L (ref 0–44)
AST: 43 U/L — ABNORMAL HIGH (ref 15–41)
Albumin: 4 g/dL (ref 3.5–5.0)
Alkaline Phosphatase: 67 U/L (ref 38–126)
Anion gap: 15 (ref 5–15)
BUN: 21 mg/dL (ref 8–23)
CO2: 22 mmol/L (ref 22–32)
Calcium: 9.4 mg/dL (ref 8.9–10.3)
Chloride: 104 mmol/L (ref 98–111)
Creatinine, Ser: 1.6 mg/dL — ABNORMAL HIGH (ref 0.61–1.24)
GFR calc Af Amer: 52 mL/min — ABNORMAL LOW (ref >60)
GFR calc non Af Amer: 45 mL/min — ABNORMAL LOW (ref >60)
Glucose, Bld: 116 mg/dL — ABNORMAL HIGH (ref 70–99)
Potassium: 3.4 mmol/L — ABNORMAL LOW (ref 3.5–5.1)
Sodium: 141 mmol/L (ref 135–145)
Total Bilirubin: 1.3 mg/dL — ABNORMAL HIGH (ref 0.3–1.2)
Total Protein: 7 g/dL (ref 6.5–8.1)

## 2019-10-02 LAB — CBC
HCT: 47.1 % (ref 39.0–52.0)
Hemoglobin: 15.7 g/dL (ref 13.0–17.0)
MCH: 31.1 pg (ref 26.0–34.0)
MCHC: 33.3 g/dL (ref 30.0–36.0)
MCV: 93.3 fL (ref 80.0–100.0)
Platelets: 288 10*3/uL (ref 150–400)
RBC: 5.05 MIL/uL (ref 4.22–5.81)
RDW: 14.2 % (ref 11.5–15.5)
WBC: 11.1 10*3/uL — ABNORMAL HIGH (ref 4.0–10.5)
nRBC: 0 % (ref 0.0–0.2)

## 2019-10-02 LAB — RAPID URINE DRUG SCREEN, HOSP PERFORMED
Amphetamines: NOT DETECTED
Barbiturates: NOT DETECTED
Benzodiazepines: NOT DETECTED
Cocaine: POSITIVE — AB
Opiates: NOT DETECTED
Tetrahydrocannabinol: POSITIVE — AB

## 2019-10-02 LAB — SODIUM: Sodium: 141 mmol/L (ref 135–145)

## 2019-10-02 LAB — MRSA PCR SCREENING: MRSA by PCR: NEGATIVE

## 2019-10-02 LAB — PROTIME-INR
INR: 1.1 (ref 0.8–1.2)
Prothrombin Time: 13.7 seconds (ref 11.4–15.2)

## 2019-10-02 LAB — DIFFERENTIAL
Abs Immature Granulocytes: 0.05 10*3/uL (ref 0.00–0.07)
Basophils Absolute: 0.1 10*3/uL (ref 0.0–0.1)
Basophils Relative: 0 %
Eosinophils Absolute: 0 10*3/uL (ref 0.0–0.5)
Eosinophils Relative: 0 %
Immature Granulocytes: 0 %
Lymphocytes Relative: 10 %
Lymphs Abs: 1.1 10*3/uL (ref 0.7–4.0)
Monocytes Absolute: 1.1 10*3/uL — ABNORMAL HIGH (ref 0.1–1.0)
Monocytes Relative: 10 %
Neutro Abs: 8.9 10*3/uL — ABNORMAL HIGH (ref 1.7–7.7)
Neutrophils Relative %: 80 %

## 2019-10-02 LAB — APTT: aPTT: 29 seconds (ref 24–36)

## 2019-10-02 LAB — SARS CORONAVIRUS 2 BY RT PCR (HOSPITAL ORDER, PERFORMED IN ~~LOC~~ HOSPITAL LAB): SARS Coronavirus 2: NEGATIVE

## 2019-10-02 LAB — CBG MONITORING, ED: Glucose-Capillary: 114 mg/dL — ABNORMAL HIGH (ref 70–99)

## 2019-10-02 SURGERY — IR WITH ANESTHESIA
Anesthesia: General

## 2019-10-02 MED ORDER — EPTIFIBATIDE 20 MG/10ML IV SOLN
INTRAVENOUS | Status: AC
  Filled 2019-10-02: qty 10

## 2019-10-02 MED ORDER — ATORVASTATIN CALCIUM 40 MG PO TABS
40.0000 mg | ORAL_TABLET | Freq: Every day | ORAL | Status: DC
  Administered 2019-10-03 – 2019-10-18 (×16): 40 mg
  Filled 2019-10-02 (×16): qty 1

## 2019-10-02 MED ORDER — IOHEXOL 240 MG/ML SOLN
150.0000 mL | Freq: Once | INTRAMUSCULAR | Status: AC | PRN
  Administered 2019-10-02: 75 mL via INTRA_ARTERIAL

## 2019-10-02 MED ORDER — ROCURONIUM BROMIDE 100 MG/10ML IV SOLN
INTRAVENOUS | Status: DC | PRN
  Administered 2019-10-02: 70 mg via INTRAVENOUS
  Administered 2019-10-02: 20 mg via INTRAVENOUS

## 2019-10-02 MED ORDER — POLYETHYLENE GLYCOL 3350 17 G PO PACK
17.0000 g | PACK | Freq: Every day | ORAL | Status: DC
  Administered 2019-10-02 – 2019-10-19 (×6): 17 g
  Filled 2019-10-02 (×6): qty 1

## 2019-10-02 MED ORDER — PROPOFOL 1000 MG/100ML IV EMUL
0.0000 ug/kg/min | INTRAVENOUS | Status: DC
  Administered 2019-10-02 (×2): 50 ug/kg/min via INTRAVENOUS
  Administered 2019-10-03 (×2): 40 ug/kg/min via INTRAVENOUS
  Administered 2019-10-03: 50 ug/kg/min via INTRAVENOUS
  Administered 2019-10-04 – 2019-10-05 (×5): 40 ug/kg/min via INTRAVENOUS
  Administered 2019-10-05 (×2): 20 ug/kg/min via INTRAVENOUS
  Administered 2019-10-06: 50 ug/kg/min via INTRAVENOUS
  Filled 2019-10-02 (×19): qty 100

## 2019-10-02 MED ORDER — IOHEXOL 350 MG/ML SOLN
100.0000 mL | Freq: Once | INTRAVENOUS | Status: AC | PRN
  Administered 2019-10-02: 100 mL via INTRAVENOUS

## 2019-10-02 MED ORDER — FENTANYL CITRATE (PF) 100 MCG/2ML IJ SOLN
INTRAMUSCULAR | Status: AC
  Filled 2019-10-02: qty 2

## 2019-10-02 MED ORDER — TIROFIBAN HCL IN NACL 5-0.9 MG/100ML-% IV SOLN
INTRAVENOUS | Status: AC
  Filled 2019-10-02: qty 100

## 2019-10-02 MED ORDER — CHLORHEXIDINE GLUCONATE CLOTH 2 % EX PADS
6.0000 | MEDICATED_PAD | Freq: Every day | CUTANEOUS | Status: DC
  Administered 2019-10-03 – 2019-10-25 (×20): 6 via TOPICAL

## 2019-10-02 MED ORDER — PANTOPRAZOLE SODIUM 40 MG PO PACK
40.0000 mg | PACK | ORAL | Status: DC
  Administered 2019-10-02 – 2019-10-06 (×5): 40 mg
  Filled 2019-10-02 (×6): qty 20

## 2019-10-02 MED ORDER — CLEVIDIPINE BUTYRATE 0.5 MG/ML IV EMUL
0.0000 mg/h | INTRAVENOUS | Status: DC
  Administered 2019-10-02: 21 mg/h via INTRAVENOUS
  Administered 2019-10-02 (×3): 32 mg/h via INTRAVENOUS
  Administered 2019-10-02: 1 mg/h via INTRAVENOUS
  Administered 2019-10-02: 21 mg/h via INTRAVENOUS
  Administered 2019-10-02: 32 mg/h via INTRAVENOUS
  Administered 2019-10-03: 28 mg/h via INTRAVENOUS
  Administered 2019-10-03: 15 mg/h via INTRAVENOUS
  Administered 2019-10-03: 32 mg/h via INTRAVENOUS
  Administered 2019-10-03: 26 mg/h via INTRAVENOUS
  Administered 2019-10-03: 32 mg/h via INTRAVENOUS
  Administered 2019-10-03: 28 mg/h via INTRAVENOUS
  Administered 2019-10-03: 32 mg/h via INTRAVENOUS
  Administered 2019-10-04: 2 mg/h via INTRAVENOUS
  Administered 2019-10-04: 8 mg/h via INTRAVENOUS
  Filled 2019-10-02 (×3): qty 50
  Filled 2019-10-02 (×2): qty 100
  Filled 2019-10-02 (×2): qty 50
  Filled 2019-10-02: qty 100
  Filled 2019-10-02 (×3): qty 50
  Filled 2019-10-02: qty 100
  Filled 2019-10-02: qty 50
  Filled 2019-10-02: qty 100
  Filled 2019-10-02 (×3): qty 50

## 2019-10-02 MED ORDER — ESMOLOL HCL 100 MG/10ML IV SOLN
INTRAVENOUS | Status: DC | PRN
  Administered 2019-10-02: 20 mg via INTRAVENOUS
  Administered 2019-10-02: 30 mg via INTRAVENOUS
  Administered 2019-10-02: 40 mg via INTRAVENOUS

## 2019-10-02 MED ORDER — CEFAZOLIN SODIUM-DEXTROSE 2-4 GM/100ML-% IV SOLN
INTRAVENOUS | Status: AC
  Filled 2019-10-02: qty 100

## 2019-10-02 MED ORDER — ASPIRIN 325 MG PO TABS
325.0000 mg | ORAL_TABLET | Freq: Every day | ORAL | Status: DC
  Filled 2019-10-02: qty 1

## 2019-10-02 MED ORDER — SODIUM CHLORIDE 3 % IV SOLN
INTRAVENOUS | Status: DC
  Administered 2019-10-02 – 2019-10-03 (×3): 75 mL/h via INTRAVENOUS
  Filled 2019-10-02 (×13): qty 500

## 2019-10-02 MED ORDER — AMLODIPINE BESYLATE 10 MG PO TABS
10.0000 mg | ORAL_TABLET | Freq: Every day | ORAL | Status: DC
  Administered 2019-10-02 – 2019-10-15 (×13): 10 mg
  Filled 2019-10-02 (×13): qty 1

## 2019-10-02 MED ORDER — STROKE: EARLY STAGES OF RECOVERY BOOK
Freq: Once | Status: AC
  Filled 2019-10-02: qty 1

## 2019-10-02 MED ORDER — FENTANYL CITRATE (PF) 100 MCG/2ML IJ SOLN
25.0000 ug | INTRAMUSCULAR | Status: DC | PRN
  Administered 2019-10-02 – 2019-10-06 (×8): 100 ug via INTRAVENOUS
  Filled 2019-10-02 (×9): qty 2

## 2019-10-02 MED ORDER — NITROGLYCERIN 1 MG/10 ML FOR IR/CATH LAB
INTRA_ARTERIAL | Status: AC
  Filled 2019-10-02: qty 10

## 2019-10-02 MED ORDER — ORAL CARE MOUTH RINSE
15.0000 mL | OROMUCOSAL | Status: DC
  Administered 2019-10-02 – 2019-10-14 (×117): 15 mL via OROMUCOSAL

## 2019-10-02 MED ORDER — DOCUSATE SODIUM 50 MG/5ML PO LIQD
100.0000 mg | Freq: Two times a day (BID) | ORAL | Status: DC
  Administered 2019-10-02 – 2019-10-19 (×16): 100 mg
  Filled 2019-10-02 (×17): qty 10

## 2019-10-02 MED ORDER — FENTANYL CITRATE (PF) 100 MCG/2ML IJ SOLN
INTRAMUSCULAR | Status: DC | PRN
  Administered 2019-10-02 (×3): 100 ug via INTRAVENOUS

## 2019-10-02 MED ORDER — PROTAMINE SULFATE 10 MG/ML IV SOLN
INTRAVENOUS | Status: DC | PRN
Start: 2019-10-02 — End: 2019-10-02
  Administered 2019-10-02 (×2): 5 mg via INTRAVENOUS

## 2019-10-02 MED ORDER — LACTATED RINGERS IV SOLN: INTRAVENOUS | Status: DC | PRN

## 2019-10-02 MED ORDER — PROPOFOL 500 MG/50ML IV EMUL
INTRAVENOUS | Status: DC | PRN
  Administered 2019-10-02: 50 ug/kg/min via INTRAVENOUS

## 2019-10-02 MED ORDER — IOHEXOL 240 MG/ML SOLN
INTRAMUSCULAR | Status: AC
  Filled 2019-10-02: qty 200

## 2019-10-02 MED ORDER — CLEVIDIPINE BUTYRATE 0.5 MG/ML IV EMUL
INTRAVENOUS | Status: AC
  Administered 2019-10-03: 32 mg/h via INTRAVENOUS
  Filled 2019-10-02: qty 50

## 2019-10-02 MED ORDER — CHLORHEXIDINE GLUCONATE 0.12% ORAL RINSE (MEDLINE KIT)
15.0000 mL | Freq: Two times a day (BID) | OROMUCOSAL | Status: DC
  Administered 2019-10-02 – 2019-10-13 (×24): 15 mL via OROMUCOSAL

## 2019-10-02 MED ORDER — TIROFIBAN HCL 3.75 MG/15ML IV CONC
INTRAVENOUS | Status: AC
  Filled 2019-10-02: qty 15

## 2019-10-02 MED ORDER — POLYETHYLENE GLYCOL 3350 17 G PO PACK: 17.0000 g | PACK | Freq: Every day | ORAL | Status: DC

## 2019-10-02 MED ORDER — LIDOCAINE HCL (CARDIAC) PF 100 MG/5ML IV SOSY
PREFILLED_SYRINGE | INTRAVENOUS | Status: DC | PRN
  Administered 2019-10-02: 60 mg via INTRAVENOUS

## 2019-10-02 MED ORDER — IOHEXOL 240 MG/ML SOLN
150.0000 mL | Freq: Once | INTRAMUSCULAR | Status: AC | PRN
  Administered 2019-10-02: 25 mL via INTRA_ARTERIAL

## 2019-10-02 MED ORDER — CARVEDILOL 3.125 MG PO TABS: 3.1250 mg | ORAL_TABLET | Freq: Two times a day (BID) | ORAL | Status: DC

## 2019-10-02 MED ORDER — CLEVIDIPINE BUTYRATE 0.5 MG/ML IV EMUL
INTRAVENOUS | Status: DC | PRN
  Administered 2019-10-02: 2 mg/h via INTRAVENOUS

## 2019-10-02 MED ORDER — ASPIRIN 81 MG PO CHEW
CHEWABLE_TABLET | ORAL | Status: AC
  Filled 2019-10-02: qty 1

## 2019-10-02 MED ORDER — HYDRALAZINE HCL 20 MG/ML IJ SOLN
10.0000 mg | INTRAMUSCULAR | Status: DC | PRN
  Administered 2019-10-02 – 2019-10-03 (×2): 10 mg via INTRAVENOUS
  Filled 2019-10-02 (×2): qty 1

## 2019-10-02 MED ORDER — MIDAZOLAM HCL 2 MG/2ML IJ SOLN
1.0000 mg | INTRAMUSCULAR | Status: DC | PRN
  Administered 2019-10-02: 1 mg via INTRAVENOUS
  Filled 2019-10-02: qty 2

## 2019-10-02 MED ORDER — ATORVASTATIN CALCIUM 40 MG PO TABS: 40.0000 mg | ORAL_TABLET | Freq: Every day | ORAL | Status: DC

## 2019-10-02 MED ORDER — ACETAMINOPHEN 325 MG PO TABS: 650.0000 mg | ORAL_TABLET | ORAL | Status: DC | PRN

## 2019-10-02 MED ORDER — AMLODIPINE BESYLATE 10 MG PO TABS: 10.0000 mg | ORAL_TABLET | Freq: Every day | ORAL | Status: DC

## 2019-10-02 MED ORDER — SODIUM CHLORIDE 0.9% FLUSH: 3.0000 mL | Freq: Once | INTRAVENOUS | Status: DC

## 2019-10-02 MED ORDER — POTASSIUM CHLORIDE 20 MEQ/15ML (10%) PO SOLN
40.0000 meq | Freq: Every day | ORAL | Status: DC
  Administered 2019-10-02 – 2019-10-05 (×4): 40 meq
  Filled 2019-10-02 (×4): qty 30

## 2019-10-02 MED ORDER — PHENYLEPHRINE HCL (PRESSORS) 10 MG/ML IV SOLN
INTRAVENOUS | Status: DC | PRN
  Administered 2019-10-02 (×3): 40 ug via INTRAVENOUS
  Administered 2019-10-02: 80 ug via INTRAVENOUS

## 2019-10-02 MED ORDER — PROPOFOL 10 MG/ML IV BOLUS
INTRAVENOUS | Status: DC | PRN
  Administered 2019-10-02: 50 mg via INTRAVENOUS
  Administered 2019-10-02: 40 mg via INTRAVENOUS
  Administered 2019-10-02: 150 mg via INTRAVENOUS
  Administered 2019-10-02 (×3): 50 mg via INTRAVENOUS

## 2019-10-02 MED ORDER — CARVEDILOL 3.125 MG PO TABS
3.1250 mg | ORAL_TABLET | Freq: Two times a day (BID) | ORAL | Status: DC
  Administered 2019-10-02 – 2019-10-03 (×2): 3.125 mg
  Filled 2019-10-02 (×2): qty 1

## 2019-10-02 MED ORDER — SODIUM CHLORIDE 0.9 % IV SOLN: INTRAVENOUS | Status: DC

## 2019-10-02 MED ORDER — ACETAMINOPHEN 160 MG/5ML PO SOLN
650.0000 mg | ORAL | Status: DC | PRN
  Administered 2019-10-04 – 2019-10-07 (×4): 650 mg
  Filled 2019-10-02 (×4): qty 20.3

## 2019-10-02 MED ORDER — SODIUM CHLORIDE 0.9 % IV SOLN
INTRAVENOUS | Status: DC | PRN
Start: 2019-10-02 — End: 2019-10-02

## 2019-10-02 MED ORDER — ACETAMINOPHEN 650 MG RE SUPP: 650.0000 mg | RECTAL | Status: DC | PRN

## 2019-10-02 MED ORDER — VERAPAMIL HCL 2.5 MG/ML IV SOLN
INTRAVENOUS | Status: AC
  Filled 2019-10-02: qty 2

## 2019-10-02 MED ORDER — ASPIRIN 300 MG RE SUPP: 300.0000 mg | Freq: Every day | RECTAL | Status: DC

## 2019-10-02 MED ORDER — CLOPIDOGREL BISULFATE 300 MG PO TABS
ORAL_TABLET | ORAL | Status: AC
  Filled 2019-10-02: qty 1

## 2019-10-02 MED ORDER — PHENYLEPHRINE HCL-NACL 10-0.9 MG/250ML-% IV SOLN
INTRAVENOUS | Status: DC | PRN
  Administered 2019-10-02: 20 ug/min via INTRAVENOUS

## 2019-10-02 MED ORDER — SUCCINYLCHOLINE CHLORIDE 20 MG/ML IJ SOLN
INTRAMUSCULAR | Status: DC | PRN
  Administered 2019-10-02: 120 mg via INTRAVENOUS

## 2019-10-02 MED ORDER — DOCUSATE SODIUM 50 MG/5ML PO LIQD: 100.0000 mg | Freq: Two times a day (BID) | ORAL | Status: DC

## 2019-10-02 MED ORDER — TICAGRELOR 90 MG PO TABS
ORAL_TABLET | ORAL | Status: AC
  Filled 2019-10-02: qty 2

## 2019-10-02 NOTE — ED Triage Notes (Signed)
Pt arrives via EMS with [facility] 1910 yesterday. EMS reports they responsed to his residence last night due to a fall and pt was hypertensive at 212/110 but refused treatment. Per wife, pt was not baseline self after the fall. Upon arrival, pt right sided paralysis, aphasia, and left sided gaze.   190/100, 110 HR, 121 CBG, 97% RA

## 2019-10-02 NOTE — Anesthesia Postprocedure Evaluation (Signed)
Anesthesia Post Note  Patient: Riley Wagner  Procedure(s) Performed: IR WITH ANESTHESIA (N/A )     Patient location during evaluation: ICU Anesthesia Type: General Level of consciousness: sedated and patient remains intubated per anesthesia plan Pain management: pain level controlled Vital Signs Assessment: post-procedure vital signs reviewed and stable Respiratory status: patient remains intubated per anesthesia plan Cardiovascular status: unstable Postop Assessment: no apparent nausea or vomiting Anesthetic complications: no    Last Vitals:  Vitals:   10/02/19 1229 10/02/19 1245  BP: 132/73 (!) 145/97  Pulse: 73   Resp: 18 12  Temp:    SpO2: 96%     Last Pain:  Vitals:   10/02/19 0828  TempSrc: Oral    LLE Motor Response: (P) No movement to painful stimulus (10/02/19 1245)   RLE Motor Response: (P) No movement to painful stimulus (10/02/19 1245)        Audry Pili

## 2019-10-02 NOTE — ED Provider Notes (Signed)
[place] EMERGENCY DEPARTMENT Provider Note   CSN: [id number] Arrival date & time: 10/02/19  [id number]  An emergency department physician performed an initial assessment on this suspected stroke patient at [phone].  History No chief complaint on file.   Riley Wagner is a 64 y.o. male.  HPI      64 year old male with a history of hypertension, CKD stage III, chronic combined systolic and diastolic heart failure, hyperlipidemia, smoking, presents as a code stroke.  Last night, patient had presented with a syncopal episode around 7 PM, at that time EMS had gone to his home, and did a neurologic exam which was normal, and patient declined transport to the hospital.  His last known normal was 8 PM, and upon waking this morning was found to have right sided weakness, left gaze deviation, and aphasia.  Code stroke was called by EMS, neurology team is at bedside on arrival.  History is limited by acuity of condition.l   Past Medical History:  Diagnosis Date  . Hypertension   . MI (myocardial infarction) [facility])     Patient Active Problem List   Diagnosis Date Noted  . Status post stroke 10/02/2019  . Essential hypertension 12/27/2013  . Other and unspecified hyperlipidemia 12/27/2013  . History of back surgery 12/27/2013  . Smoking 12/27/2013  . Chronic kidney disease, stage III (moderate) 06/14/2012  . Chronic combined systolic and diastolic heart failure ([place]) 04/11/2012    Past Surgical History:  Procedure Laterality Date  . BACK SURGERY     2004       Family History  Problem Relation Age of Onset  . Hypertension Mother   . Diabetes Mother   . Stroke Mother     Social History   Tobacco Use  . Smoking status: Former Smoker    Packs/day: 0.20    Years: 25.00    Pack years: 5.00    Types: Cigarettes    Quit date: 06/21/2014    Years since quitting: 5.2  . Smokeless tobacco: Never Used  Substance Use Topics  . Alcohol use: Yes    Alcohol/week: 0.0  standard drinks    Comment: occasionally  . Drug use: No    Home Medications Prior to Admission medications   Medication Sig Start Date End Date Taking? Authorizing Provider  acetaminophen (TYLENOL) 500 MG tablet Take 2 tablets (1,000 mg total) by mouth every 6 (six) hours as needed. 02/13/17   Alfonse Spruce, FNP  amLODipine (NORVASC) 10 MG tablet Take one pill one time daily to help lower blood pressure 03/23/19   Fulp, Cammie, MD  aspirin EC 81 MG tablet Take 1 tablet (81 mg total) by mouth daily. 02/13/17   Alfonse Spruce, FNP  atorvastatin (LIPITOR) 40 MG tablet Take 1 tablet (40 mg total) by mouth daily. To help lower cholesterol 03/23/19   Fulp, Cammie, MD  carvedilol (COREG) 3.125 MG tablet TAKE ONE TABLET BY MOUTH TWICE DAY to help lower blood pressure 03/23/19   Fulp, Cammie, MD  cyclobenzaprine (FLEXERIL) 5 MG tablet Take 1 tablet (5 mg total) by mouth every 8 (eight) hours as needed for muscle spasms. 02/13/17   Alfonse Spruce, FNP  hydrochlorothiazide (HYDRODIURIL) 25 MG tablet Take 1 tablet (25 mg total) by mouth daily. To help lower blood pressure 03/23/19   Fulp, Cammie, MD  lisinopril (ZESTRIL) 40 MG tablet Take 1 tablet (40 mg total) by mouth daily. To help lower blood pressure 03/23/19   Antony Blackbird, MD  ranitidine (ZANTAC) 150 MG tablet Take 150 mg by mouth daily as needed for heartburn.    [provider]    Allergies    Patient has no known allergies.  Review of Systems   Review of Systems  Unable to perform ROS: Patient nonverbal  Neurological: Positive for syncope, speech difficulty and weakness.    Physical Exam Updated Vital Signs BP (!) 215/120   Pulse (!) 111   Resp 19   Physical Exam Constitutional:      General: He is not in acute distress.    Appearance: Normal appearance. He is not ill-appearing.  HENT:     Head: Normocephalic and atraumatic.  Eyes:     Extraocular Movements: Extraocular movements intact.      Conjunctiva/sclera: Conjunctivae normal.     Pupils: Pupils are equal, round, and reactive to light.  Cardiovascular:     Rate and Rhythm: Normal rate and regular rhythm.     Pulses: Normal pulses.  Pulmonary:     Effort: Pulmonary effort is normal. No respiratory distress.  Musculoskeletal:        General: No swelling or tenderness.     Cervical back: Normal range of motion.  Skin:    General: Skin is warm and dry.     Findings: No erythema or rash.  Neurological:     Mental Status: He is alert.     GCS: GCS eye subscore is 4. GCS verbal subscore is 5. GCS motor subscore is 6.     Cranial Nerves: No cranial nerve deficit or dysarthria.     Motor: Weakness (right arm and leg paralysis) present. No tremor.     Comments: Left gaze deviation, will not cross midline to right Dense aphasia, unable to verbalize     ED Results / Procedures / Treatments   Labs (all labs ordered are listed, but only abnormal results are displayed) Labs Reviewed  CBC - Abnormal; Notable for the following components:      Result Value   WBC 11.1 (*)    All other components within normal limits  DIFFERENTIAL - Abnormal; Notable for the following components:   Neutro Abs 8.9 (*)    Monocytes Absolute 1.1 (*)    All other components within normal limits  COMPREHENSIVE METABOLIC PANEL - Abnormal; Notable for the following components:   Potassium 3.4 (*)    Glucose, Bld 116 (*)    Creatinine, Ser 1.60 (*)    AST 43 (*)    Total Bilirubin 1.3 (*)    GFR calc non Af Amer 45 (*)    GFR calc Af Amer 52 (*)    All other components within normal limits  I-STAT CHEM 8, ED - Abnormal; Notable for the following components:   Potassium 3.2 (*)    BUN 28 (*)    Creatinine, Ser 1.50 (*)    Glucose, Bld 110 (*)    Calcium, Ion 1.14 (*)    All other components within normal limits  CBG MONITORING, ED - Abnormal; Notable for the following components:   Glucose-Capillary 114 (*)    All other components within  normal limits  SARS CORONAVIRUS 2 BY RT PCR (HOSPITAL ORDER, [place] [facility])  PROTIME-INR  APTT    EKG None  Radiology CT Code Stroke CTA Head W/WO contrast  Result Date: 10/02/2019 CLINICAL DATA:  Acute presentation with speech disturbance EXAM: CT ANGIOGRAPHY HEAD AND NECK CT PERFUSION BRAIN TECHNIQUE: Multidetector CT imaging of the head  and neck was performed using the standard protocol during bolus administration of intravenous contrast. Multiplanar CT image reconstructions and MIPs were obtained to evaluate the vascular anatomy. Carotid stenosis measurements (when applicable) are obtained utilizing NASCET criteria, using the distal internal carotid diameter as the denominator. Multiphase CT imaging of the brain was performed following IV bolus contrast injection. Subsequent parametric perfusion maps were calculated using RAPID software. CONTRAST:  145mL OMNIPAQUE IOHEXOL 350 MG/ML SOLN COMPARISON:  Head CT earlier same day FINDINGS: FINDINGS CTA NECK FINDINGS Aortic arch: Aortic atherosclerosis. Branching pattern is normal without origin stenosis. Right carotid system: Common carotid artery shows atherosclerotic plaque with stenosis up to 40% along its course to the bifurcation. Carotid bifurcation shows soft and calcified plaque. No stenosis of the ICA compared to the more distal cervical ICA territory. Left carotid system: Common carotid artery shows atherosclerotic plaque with narrowing along its course up to 50%. Soft and calcified plaque at the carotid bifurcation and ICA bulb. Minimal diameter in the ICA bulb is 2 mm. Compared to a more distal cervical ICA diameter of 3 mm, this indicates a 30-40% reduction, though the vessel size is likely reduced due to decreased outflow. Vertebral arteries: Right vertebral artery stenosis of 70% or greater. Beyond that, the vessel is patent through the cervical region to the foramen magnum. Severe stenosis of the left vertebral  artery origin common nearly occluded, but with ongoing flow/patency to the foramen magnum. Skeleton: Ordinary cervical spondylosis. Other neck: No mass or lymphadenopathy. Upper chest: Negative Review of the MIP images confirms the above findings CTA HEAD FINDINGS Anterior circulation: Right internal carotid artery is patent through the skull base and siphon regions. Advanced atherosclerotic calcification in the carotid siphon region with serial stenoses on the order of 70%. Supraclinoid ICA is patent. Right anterior and middle cerebral vessels are patent. Widespread atherosclerotic narrowing and irregularity. Left internal carotid artery this severely disease at the skull base and siphon region with severe atherosclerotic narrowing and irregularity. Occlusion at the carotid terminus affecting the complete left MCA distribution. Small amount of collateral flow. Patient has an azygos anterior cerebral artery with apparent intact supply to the distal ACA regions. Posterior circulation: Both vertebral arteries are patent at the foramen magnum level. 70% stenosis of the distal left V4 segment. Both vertebral arteries do reach the basilar. No focal basilar stenosis. Superior cerebellar and posterior cerebral branch vessels are patent. Venous sinuses: Patent and normal. Anatomic variants: None other significant. Review of the MIP images confirms the above findings CT Brain Perfusion Findings: ASPECTS: 8 or possibly 7 CBF (<30%) Volume: 60mL Perfusion (Tmax>6.0s) volume: 166mL Mismatch Volume: 144mL Infarction Location:Left insula and frontal operculum and underlying white matter Hypoperfusion index of 0.5 suggests poor collateral flow. IMPRESSION: Occlusion of the left carotid terminus. 31 cc region infarct core in the left insula and operculum. 105 cc of penumbra at risk brain throughout the remainder of the left MCA territory. Widespread atherosclerotic disease. Stenoses of both common carotid arteries along their  course to the bifurcations, 40% on the right and 50% on the left. Extensive soft and calcified plaque at both carotid bifurcations and ICA bulbs. No measurable stenosis on the right. 30-40% stenosis on the left, possibly under measured because of decreased size of the more distal internal carotid artery. Severe atherosclerotic disease in both carotid siphon regions with severe Earl severe stenoses. Atherosclerotic narrowing and irregularity throughout the right MCA vessels and both PCA vessels. Azygos anterior cerebral anatomy results in protection of the anterior  cerebral territories. These results were communicated to Dr. Leonel Ramsay at 9:01 amon 5/16/2021by text page via the [facility] messaging system. Electronically Signed   By: Nelson Chimes M.D.   On: 10/02/2019 09:04   CT Code Stroke CTA Neck W/WO contrast  Result Date: 10/02/2019 CLINICAL DATA:  Acute presentation with speech disturbance EXAM: CT ANGIOGRAPHY HEAD AND NECK CT PERFUSION BRAIN TECHNIQUE: Multidetector CT imaging of the head and neck was performed using the standard protocol during bolus administration of intravenous contrast. Multiplanar CT image reconstructions and MIPs were obtained to evaluate the vascular anatomy. Carotid stenosis measurements (when applicable) are obtained utilizing NASCET criteria, using the distal internal carotid diameter as the denominator. Multiphase CT imaging of the brain was performed following IV bolus contrast injection. Subsequent parametric perfusion maps were calculated using RAPID software. CONTRAST:  171mL OMNIPAQUE IOHEXOL 350 MG/ML SOLN COMPARISON:  Head CT earlier same day FINDINGS: FINDINGS CTA NECK FINDINGS Aortic arch: Aortic atherosclerosis. Branching pattern is normal without origin stenosis. Right carotid system: Common carotid artery shows atherosclerotic plaque with stenosis up to 40% along its course to the bifurcation. Carotid bifurcation shows soft and calcified plaque. No stenosis of the ICA  compared to the more distal cervical ICA territory. Left carotid system: Common carotid artery shows atherosclerotic plaque with narrowing along its course up to 50%. Soft and calcified plaque at the carotid bifurcation and ICA bulb. Minimal diameter in the ICA bulb is 2 mm. Compared to a more distal cervical ICA diameter of 3 mm, this indicates a 30-40% reduction, though the vessel size is likely reduced due to decreased outflow. Vertebral arteries: Right vertebral artery stenosis of 70% or greater. Beyond that, the vessel is patent through the cervical region to the foramen magnum. Severe stenosis of the left vertebral artery origin common nearly occluded, but with ongoing flow/patency to the foramen magnum. Skeleton: Ordinary cervical spondylosis. Other neck: No mass or lymphadenopathy. Upper chest: Negative Review of the MIP images confirms the above findings CTA HEAD FINDINGS Anterior circulation: Right internal carotid artery is patent through the skull base and siphon regions. Advanced atherosclerotic calcification in the carotid siphon region with serial stenoses on the order of 70%. Supraclinoid ICA is patent. Right anterior and middle cerebral vessels are patent. Widespread atherosclerotic narrowing and irregularity. Left internal carotid artery this severely disease at the skull base and siphon region with severe atherosclerotic narrowing and irregularity. Occlusion at the carotid terminus affecting the complete left MCA distribution. Small amount of collateral flow. Patient has an azygos anterior cerebral artery with apparent intact supply to the distal ACA regions. Posterior circulation: Both vertebral arteries are patent at the foramen magnum level. 70% stenosis of the distal left V4 segment. Both vertebral arteries do reach the basilar. No focal basilar stenosis. Superior cerebellar and posterior cerebral branch vessels are patent. Venous sinuses: Patent and normal. Anatomic variants: None other  significant. Review of the MIP images confirms the above findings CT Brain Perfusion Findings: ASPECTS: 8 or possibly 7 CBF (<30%) Volume: 13mL Perfusion (Tmax>6.0s) volume: 153mL Mismatch Volume: 177mL Infarction Location:Left insula and frontal operculum and underlying white matter Hypoperfusion index of 0.5 suggests poor collateral flow. IMPRESSION: Occlusion of the left carotid terminus. 31 cc region infarct core in the left insula and operculum. 105 cc of penumbra at risk brain throughout the remainder of the left MCA territory. Widespread atherosclerotic disease. Stenoses of both common carotid arteries along their course to the bifurcations, 40% on the right and 50% on the left. Extensive soft  and calcified plaque at both carotid bifurcations and ICA bulbs. No measurable stenosis on the right. 30-40% stenosis on the left, possibly under measured because of decreased size of the more distal internal carotid artery. Severe atherosclerotic disease in both carotid siphon regions with severe Earl severe stenoses. Atherosclerotic narrowing and irregularity throughout the right MCA vessels and both PCA vessels. Azygos anterior cerebral anatomy results in protection of the anterior cerebral territories. These results were communicated to Dr. Leonel Ramsay at 9:01 amon 5/16/2021by text page via the [facility] messaging system. Electronically Signed   By: Nelson Chimes M.D.   On: 10/02/2019 09:04   CT Code Stroke Cerebral Perfusion with contrast  Result Date: 10/02/2019 CLINICAL DATA:  Acute presentation with speech disturbance EXAM: CT ANGIOGRAPHY HEAD AND NECK CT PERFUSION BRAIN TECHNIQUE: Multidetector CT imaging of the head and neck was performed using the standard protocol during bolus administration of intravenous contrast. Multiplanar CT image reconstructions and MIPs were obtained to evaluate the vascular anatomy. Carotid stenosis measurements (when applicable) are obtained utilizing NASCET criteria, using the  distal internal carotid diameter as the denominator. Multiphase CT imaging of the brain was performed following IV bolus contrast injection. Subsequent parametric perfusion maps were calculated using RAPID software. CONTRAST:  145mL OMNIPAQUE IOHEXOL 350 MG/ML SOLN COMPARISON:  Head CT earlier same day FINDINGS: FINDINGS CTA NECK FINDINGS Aortic arch: Aortic atherosclerosis. Branching pattern is normal without origin stenosis. Right carotid system: Common carotid artery shows atherosclerotic plaque with stenosis up to 40% along its course to the bifurcation. Carotid bifurcation shows soft and calcified plaque. No stenosis of the ICA compared to the more distal cervical ICA territory. Left carotid system: Common carotid artery shows atherosclerotic plaque with narrowing along its course up to 50%. Soft and calcified plaque at the carotid bifurcation and ICA bulb. Minimal diameter in the ICA bulb is 2 mm. Compared to a more distal cervical ICA diameter of 3 mm, this indicates a 30-40% reduction, though the vessel size is likely reduced due to decreased outflow. Vertebral arteries: Right vertebral artery stenosis of 70% or greater. Beyond that, the vessel is patent through the cervical region to the foramen magnum. Severe stenosis of the left vertebral artery origin common nearly occluded, but with ongoing flow/patency to the foramen magnum. Skeleton: Ordinary cervical spondylosis. Other neck: No mass or lymphadenopathy. Upper chest: Negative Review of the MIP images confirms the above findings CTA HEAD FINDINGS Anterior circulation: Right internal carotid artery is patent through the skull base and siphon regions. Advanced atherosclerotic calcification in the carotid siphon region with serial stenoses on the order of 70%. Supraclinoid ICA is patent. Right anterior and middle cerebral vessels are patent. Widespread atherosclerotic narrowing and irregularity. Left internal carotid artery this severely disease at the  skull base and siphon region with severe atherosclerotic narrowing and irregularity. Occlusion at the carotid terminus affecting the complete left MCA distribution. Small amount of collateral flow. Patient has an azygos anterior cerebral artery with apparent intact supply to the distal ACA regions. Posterior circulation: Both vertebral arteries are patent at the foramen magnum level. 70% stenosis of the distal left V4 segment. Both vertebral arteries do reach the basilar. No focal basilar stenosis. Superior cerebellar and posterior cerebral branch vessels are patent. Venous sinuses: Patent and normal. Anatomic variants: None other significant. Review of the MIP images confirms the above findings CT Brain Perfusion Findings: ASPECTS: 8 or possibly 7 CBF (<30%) Volume: 14mL Perfusion (Tmax>6.0s) volume: 112mL Mismatch Volume: 17mL Infarction Location:Left insula and frontal operculum  and underlying white matter Hypoperfusion index of 0.5 suggests poor collateral flow. IMPRESSION: Occlusion of the left carotid terminus. 31 cc region infarct core in the left insula and operculum. 105 cc of penumbra at risk brain throughout the remainder of the left MCA territory. Widespread atherosclerotic disease. Stenoses of both common carotid arteries along their course to the bifurcations, 40% on the right and 50% on the left. Extensive soft and calcified plaque at both carotid bifurcations and ICA bulbs. No measurable stenosis on the right. 30-40% stenosis on the left, possibly under measured because of decreased size of the more distal internal carotid artery. Severe atherosclerotic disease in both carotid siphon regions with severe Earl severe stenoses. Atherosclerotic narrowing and irregularity throughout the right MCA vessels and both PCA vessels. Azygos anterior cerebral anatomy results in protection of the anterior cerebral territories. These results were communicated to Dr. Leonel Ramsay at 9:01 amon 5/16/2021by text page  via the [facility] messaging system. Electronically Signed   By: Nelson Chimes M.D.   On: 10/02/2019 09:04   CT HEAD CODE STROKE WO CONTRAST  Result Date: 10/02/2019 CLINICAL DATA:  Code stroke. Code stroke. Gaze disturbance. Aphasia. EXAM: CT HEAD WITHOUT CONTRAST TECHNIQUE: Contiguous axial images were obtained from the base of the skull through the vertex without intravenous contrast. COMPARISON:  None. FINDINGS: Brain: Background pattern of atrophy and chronic small-vessel ischemic changes. Probably old left parietal cortical and subcortical infarction. Acute appearing cytotoxic edema in the insula and frontal operculum on the left consistent with a acute/recent infarction. No evidence of hemorrhage or mass effect. No hydrocephalus or extra-axial collection. Vascular: There is atherosclerotic calcification of the major vessels at the base of the brain. Skull: Negative Sinuses/Orbits: Mucosal inflammatory changes.  Orbits negative. Other: None ASPECTS ([place] Stroke Program Early CT Score) - Ganglionic level infarction (caudate, lentiform nuclei, internal capsule, insula, M1-M3 cortex): 5 - Supraganglionic infarction (M4-M6 cortex): 3, allowing that the left parietal changes are old. Total score (0-10 with 10 being normal): 8 IMPRESSION: 1. Acute cytotoxic edema in the left insula and frontal operculum. Left parietal cortical and subcortical abnormality that I favor is old. Some possibility a could be subacute. Extensive chronic small-vessel ischemic changes elsewhere. No hemorrhage. 2. ASPECTS is 8 3. These results were communicated to Dr. Leonel Ramsay at 8:36 amon 5/16/2021by text page via the [facility] messaging system. Electronically Signed   By: Nelson Chimes M.D.   On: 10/02/2019 08:37    Procedures .Critical Care Performed by: Gareth Morgan, MD Authorized by: Gareth Morgan, MD   Critical care provider statement:    Critical care time (minutes):  30   Critical care was time spent personally by me  on the following activities:  Discussions with consultants, evaluation of patient's response to treatment, examination of patient, ordering and performing treatments and interventions, ordering and review of laboratory studies, ordering and review of radiographic studies, pulse oximetry, re-evaluation of patient's condition, obtaining history from patient or surrogate and review of old charts   (including critical care time)  Medications Ordered in ED Medications  sodium chloride flush (NS) 0.9 % injection 3 mL (has no administration in time range)  fentaNYL (SUBLIMAZE) 100 MCG/2ML injection (has no administration in time range)  iohexol (OMNIPAQUE) 350 MG/ML injection 100 mL (100 mLs Intravenous Contrast Given 10/02/19 0850)    ED Course  I have reviewed the triage vital signs and the nursing notes.  Pertinent labs & imaging results that were available during my care of the patient were reviewed by  me and considered in my medical decision making (see chart for details).    MDM Rules/Calculators/A&P                      64 year old male with a history of hypertension, CKD stage III, chronic combined systolic and diastolic heart failure, hyperlipidemia, smoking, presents as a code stroke with aphasia, right sided weakness, left gaze deviation with last known normal 8PM.  Stroke Team at bedside on arrival.  CT without signs of ICH.  Occlusion of left carotid terminus with penumbra. Pt going to IR for further care.   Final Clinical Impression(s) / ED Diagnoses Final diagnoses:  Acute embolic stroke [facility])    Rx / DC Orders ED Discharge Orders    None       Gareth Morgan, MD 10/02/19 [phone]

## 2019-10-02 NOTE — Anesthesia Procedure Notes (Signed)
Procedure Name: Intubation Date/Time: 10/02/2019 9:18 AM Performed by: Oletta Lamas, CRNA Pre-anesthesia Checklist: Patient identified, Emergency Drugs available, Suction available and Patient being monitored Patient Re-evaluated:Patient Re-evaluated prior to induction Oxygen Delivery Method: Circle System Utilized Preoxygenation: Pre-oxygenation with 100% oxygen Induction Type: IV induction Ventilation: Mask ventilation without difficulty Laryngoscope Size: Miller and 3 Grade View: Grade II Tube type: Oral Number of attempts: 1 Airway Equipment and Method: Stylet and Oral airway Placement Confirmation: ETT inserted through vocal cords under direct vision,  positive ETCO2,  breath sounds checked- equal and bilateral and CO2 detector Secured at: 23 cm Tube secured with: Tape Dental Injury: Teeth and Oropharynx as per pre-operative assessment  Comments: 1st attempt with glidescope #3 unsuccessful.  Saturation dropped to 69% bagged patient briefly back to 100%.  Second attempt successful with Sabra Heck 3.

## 2019-10-02 NOTE — Progress Notes (Signed)
Neurologist notified of daughter's name and number. He is going to call her with updates. Info in the chart. Ellie Spickler, Rande Brunt, RN

## 2019-10-02 NOTE — ED Notes (Signed)
Pt taken to IR suite for intubation with Rapid Response and RN

## 2019-10-02 NOTE — Procedures (Signed)
INTERVENTIONAL NEURORADIOLOGY BRIEF POSTPROCEDURE NOTE   Attending: Dr. Pedro Earls  Assistant: None  Diagnosis: Left ICA terminus occlusion  Access site: RCFA  Access closure: 26F angioseal  Anesthesia: General  Medication used: refer to anesthesia note for sedation medication.  Estimated blood loss: Minimal  Specimen: None  Findings: Diffuse and severe intracranial atherosclerotic disease. There was a proximal left M1/MCA occlusion. Mechanical thrombectomy performed with a 65mm embotrap. Complete recanalization achieved but with distal slow flow related to severe ICA/MCA stenosis. Balloon angioplasty performed with improved anterograde flow. On control angiogram, contrast extravasation was noted from the ICA terminus. 10 mg of protamine administered to revert heparin in the flush lines.Balloon tamponade performed with continued bleeding upon balloon deflation. Flat panel CT showed moderate bleed on the left, including intraparenchymal bleed along the posterior limb of the internal capsule, as well as right sided intraparenchymal bleed. Upon review of first angiographic run obtained, it was noticed some contrast extravasation from the left ICA terminus prior to intervention. Given persistent bleed, decision was made to embolize the left ICA terminus. Coil embolization performed with sparing of the left anterior choroidal artery.  Patient's only available contact was his fiance Rollene Fare who was informed of procedure details and likely outcome by telephone.   Patient transferred to ICU for further care.

## 2019-10-02 NOTE — Anesthesia Procedure Notes (Signed)
Arterial Line Insertion Start/End5/16/2021 9:25 AM, 10/02/2019 9:29 AM Performed by: Janace Litten, CRNA, CRNA  Preanesthetic checklist: patient identified, IV checked, site marked, risks and benefits discussed, surgical consent, monitors and equipment checked, pre-op evaluation, timeout performed and anesthesia consent radial was placed Catheter size: 20 G Hand hygiene performed , maximum sterile barriers used  and Seldinger technique used Allen's test indicative of satisfactory collateral circulation Attempts: 1 Procedure performed without using ultrasound guided technique. Following insertion, Biopatch and dressing applied. Post procedure assessment: normal  Patient tolerated the procedure well with no immediate complications.

## 2019-10-02 NOTE — H&P (Signed)
Neurology H&P  CC: right  sided weakness  History is obtained from:patient   HPI: Riley Wagner is a 64 y.o. male with a history of hypertension who was last known well prior to bed last night. He lost conciousness last night and fell, but refused transport with BP of [id number] systolic and negative stroke screen by EMS.  Today, he was found to have right sided weakness and aphasia on awakening.  He was brought into the emergency department as a code stroke, aspects of 8 with a large penumbra by CT perfusion and therefore he was taken for emergent IR thrombectomy.   LKW: 8 PM, 5/15 tpa given?: No, outside of window IR Thrombectomy?  Yes Modified Rankin Scale: 0-Completely asymptomatic and back to baseline post- stroke NIHSS: 25   ROS: Unable to obtain due to altered mental status.   Past Medical History:  Diagnosis Date  . Hypertension   . MI (myocardial infarction) ([place])      Family History  Problem Relation Age of Onset  . Hypertension Mother   . Diabetes Mother   . Stroke Mother      Social History:  reports that he quit smoking about 5 years ago. His smoking use included cigarettes. He has a 5.00 pack-year smoking history. He has never used smokeless tobacco. He reports current alcohol use. He reports that he does not use drugs.   Prior to Admission medications   Medication Sig Start Date End Date Taking? Authorizing Provider  acetaminophen (TYLENOL) 500 MG tablet Take 2 tablets (1,000 mg total) by mouth every 6 (six) hours as needed. 02/13/17   Alfonse Spruce, FNP  amLODipine (NORVASC) 10 MG tablet Take one pill one time daily to help lower blood pressure 03/23/19   Fulp, Cammie, MD  aspirin EC 81 MG tablet Take 1 tablet (81 mg total) by mouth daily. 02/13/17   Alfonse Spruce, FNP  atorvastatin (LIPITOR) 40 MG tablet Take 1 tablet (40 mg total) by mouth daily. To help lower cholesterol 03/23/19   Fulp, Cammie, MD  carvedilol (COREG) 3.125 MG tablet TAKE ONE TABLET BY  MOUTH TWICE DAY to help lower blood pressure 03/23/19   Fulp, Cammie, MD  cyclobenzaprine (FLEXERIL) 5 MG tablet Take 1 tablet (5 mg total) by mouth every 8 (eight) hours as needed for muscle spasms. 02/13/17   Alfonse Spruce, FNP  hydrochlorothiazide (HYDRODIURIL) 25 MG tablet Take 1 tablet (25 mg total) by mouth daily. To help lower blood pressure 03/23/19   Fulp, Cammie, MD  lisinopril (ZESTRIL) 40 MG tablet Take 1 tablet (40 mg total) by mouth daily. To help lower blood pressure 03/23/19   Fulp, Cammie, MD  ranitidine (ZANTAC) 150 MG tablet Take 150 mg by mouth daily as needed for heartburn.    [provider]     Exam: Current vital signs: BP (!) 215/120   Pulse (!) 111   Resp 19    Physical Exam  Constitutional: Appears well-developed and well-nourished.  Psych: Affect appropriate to situation Eyes: No scleral injection HENT: No OP obstrucion Head: Normocephalic.  Cardiovascular: Normal rate and regular rhythm.  Respiratory: Effort normal and breath sounds normal to anterior ascultation GI: Soft.  No distension. There is no tenderness.  Skin: WDI  Neuro: Mental Status: Patient is awake, alert, he is densely aphasic, does not follow commands or speak. Cranial Nerves: II: Does not blink to threat from the right. Pupils are equal, round, and reactive to light.   III,IV, VI: Left gaze  preference, does not cross midline to the right V: Facial sensation is decreased in the right VII: Facial movement with right facial droop Motor: Right flaccid hemiparesis, does minimally withdrawal versus flexion in the right leg  sensory: Does not respond as much on the right as left  Cerebellar: Does not perform  I have reviewed labs in epic and the pertinent results are: Creatinine 1.6 Potassium 3.4 Leukocytosis 11.1  I have reviewed the images obtained: CTA/P ICA terminus occlusion with large penumbra  Primary Diagnosis:  Cerebral infarction due to occlusion or stenosis  of left carotid artery.   Secondary Diagnosis: Essential (primary) hypertension, Hypertension Emergency (SBP > 180 or DBP > 120 & end organ damage) and Hypokalemia   Impression: 64 year old male with history of hypertension and hyperlipidemia who presents with left terminal ICA occlusion.  He meets criteria for thrombectomy by both Dawn and diffuse 3 criteria.  I discussed with his girlfriend who would like to proceed, however his family who could make decisions for him are not currently available.  His girlfriend thinks that she could get the numbers, but is unsure how long will take her.  Given this, he was taken for thrombectomy via emergency consent.  Plan: - HgbA1c, fasting lipid panel - MRI  of the brain without contrast - Frequent neuro checks - Echocardiogram - Prophylactic therapy-pending outcome of procedure.  - Risk factor modification - Telemetry monitoring - PT consult, OT consult, Speech consult - Stroke team to follow  This patient is critically ill and at significant risk of neurological worsening, death and care requires constant monitoring of vital signs, hemodynamics,respiratory and cardiac monitoring, neurological assessment, discussion with family, other specialists and medical decision making of high complexity. I spent 65 minutes of neurocritical care time  in the care of  this patient. This was time spent independent of any time provided by nurse practitioner or PA.  Roland Rack, MD [facility] [phone]  If 7pm- 7am, please page neurology on call as listed in [place].

## 2019-10-02 NOTE — Progress Notes (Signed)
RT NOTES: Withdrew ETT 2cm per MD orders from 26cm at the lip to 24 cm at the lip.

## 2019-10-02 NOTE — Code Documentation (Addendum)
Code Stroke   Patient arrived with [facility] - [facility] at 1910 yesterday - patient had sustained a fall at home and EMS was called at that time, patient was hypertensive 212/110 but had no neurological deficits per EMS. Patient refused EMS treatment. Per patient's fiance he was not able to talk last night after the fall.   Arrived with forced LT gaze deviation, aphasic, and RT sided hemiparesis. Patient taken for CTH/CTA/CTP. BP remained quite elevated SBP ranged from 200-240s, HR 90s-100s. + LT Terminal ICA occlusion, determined to NVIR candidate, emergent consent obtained - taken to intubation bay, arrived at [facility] and intubated at [facility].   Start Time 0815 End Time [phone]

## 2019-10-02 NOTE — Progress Notes (Signed)
SLP Cancellation Note  Patient Details Name: Riley Wagner MRN: [id number] DOB: [birth year]   Cancelled treatment:       Reason Eval/Treat Not Completed: Medical issues which prohibited therapy(Pt currently intubated. SLP will follow up. )  Aitanna Haubner I. Hardin Negus, [place], [place] Office number [phone] Pager [phone]  Horton Marshall 10/02/2019, 2:28 PM

## 2019-10-02 NOTE — Consult Note (Signed)
NAME:  Riley Wagner, MRN:  [id number], DOB:  12-Apr-1956, LOS: 0 ADMISSION DATE:  10/02/2019, CONSULTATION DATE:  10/02/2019 REFERRING MD:  Dr. Debbrah Alar, neuro IR, CHIEF COMPLAINT:  Altered mental status   Brief History   64 yo male former smoker brought to ER with hypertensive crisis (BP 212/110), Rt sided weakness and aphasia.  Found to have Lt terminal ICA occlusion.  Intubated for airway protection.  Noted to have intraparenchymal bleed along posterior limb of IC as well as Rt sided bleed.  Had embolization of Lt ICA terminus.  Past Medical History  HTN, HLD  Significant Hospital Events   5/16 Admit  Consults:  Neuro IR  Procedures:  ETT 5/16 >> Radial aline 5/16 >>  Significant Diagnostic Tests:   CT head 5/16 >> acute cytotoxic edema in Lt insula and frontal operculum, extensive chronic small vessel ischemic changes  Echo 5/17 >>   Micro Data:  SARS CoV2 PCR 5/16 >> negative  Antimicrobials:    Interim history/subjective:    Objective   Blood pressure (!) 165/95, pulse 75, temperature 99 F (37.2 C), temperature source Oral, resp. rate 18, height 5\' 7"  (1.702 m), SpO2 94 %.    Vent Mode: PRVC FiO2 (%):  [40 %] 40 % Set Rate:  [18 bmp] 18 bmp Vt Set:  [530 mL] 530 mL PEEP:  [5 cmH20] 5 cmH20   Intake/Output Summary (Last 24 hours) at 10/02/2019 1310 Last data filed at 10/02/2019 1300 Gross per 24 hour  Intake 1907.91 ml  Output 320 ml  Net 1587.91 ml   There were no vitals filed for this visit.  Examination:  General - sedated Eyes - pupils reactive ENT - ETT  Cardiac - regular rate/rhythm, no murmur Chest - scattered rhonchi Abdomen - soft, non tender, + bowel sounds Extremities - no cyanosis, clubbing, or edema Skin - no rashes Neuro - RASS -3   Resolved Hospital Problem list     Assessment & Plan:   Acute hypoxic respiratory failure with compromised airway. - full vent support - f/u CXR, ABG  Hypertensive emergency. Hx of HLD. - goal  SBP 120 to 140 per neuro IR - continue cleviprex - restart home norvasc, coreg - f/u Echo - f/u UDS  Lt ICA occlusion with intraparenchymal bleed along posterior limb of IC as well as Rt sided bleed. - per neurology and neuro IR  CKD2. Hypokalemia. - f/u BMET  Best practice:  Diet: NPO DVT prophylaxis: SCDs GI prophylaxis: protonix Mobility: bed rest Code Status: full code Disposition: ICU  Labs   CBC: Recent Labs  Lab 10/02/19 0826 10/02/19 0831  WBC 11.1*  --   NEUTROABS 8.9*  --   HGB 15.7 17.0  HCT 47.1 50.0  MCV 93.3  --   PLT 288  --     Basic Metabolic Panel: Recent Labs  Lab 10/02/19 0826 10/02/19 0831  NA 141 144  K 3.4* 3.2*  CL 104 108  CO2 22  --   GLUCOSE 116* 110*  BUN 21 28*  CREATININE 1.60* 1.50*  CALCIUM 9.4  --    GFR: CrCl cannot be calculated (Unknown ideal weight.). Recent Labs  Lab 10/02/19 0826  WBC 11.1*    Liver Function Tests: Recent Labs  Lab 10/02/19 0826  AST 43*  ALT 28  ALKPHOS 67  BILITOT 1.3*  PROT 7.0  ALBUMIN 4.0   No results for input(s): LIPASE, AMYLASE in the last 168 hours. No results for input(s): AMMONIA in the  last 168 hours.  ABG    Component Value Date/Time   TCO2 28 10/02/2019 0831     Coagulation Profile: Recent Labs  Lab 10/02/19 0826  INR 1.1    Cardiac Enzymes: No results for input(s): CKTOTAL, CKMB, CKMBINDEX, TROPONINI in the last 168 hours.  HbA1C: No results found for: HGBA1C  CBG: Recent Labs  Lab 10/02/19 0821  GLUCAP 114*    Review of Systems:   Unable to obtain.  Past Medical History  He,  has a past medical history of Hypertension and MI (myocardial infarction) ([place]).   Surgical History    Past Surgical History:  Procedure Laterality Date  . BACK SURGERY     2004     Social History   reports that he quit smoking about 5 years ago. His smoking use included cigarettes. He has a 5.00 pack-year smoking history. He has never used smokeless tobacco. He  reports current alcohol use. He reports that he does not use drugs.   Family History   His family history includes Diabetes in his mother; Hypertension in his mother; Stroke in his mother.   Allergies No Known Allergies   Home Medications  Prior to Admission medications   Medication Sig Start Date End Date Taking? Authorizing Provider  acetaminophen (TYLENOL) 500 MG tablet Take 2 tablets (1,000 mg total) by mouth every 6 (six) hours as needed. Patient taking differently: Take 1,000 mg by mouth every 6 (six) hours as needed for mild pain.  02/13/17   Alfonse Spruce, FNP  amLODipine (NORVASC) 10 MG tablet Take one pill one time daily to help lower blood pressure Patient taking differently: Take 10 mg by mouth daily.  03/23/19   Fulp, Cammie, MD  aspirin EC 81 MG tablet Take 1 tablet (81 mg total) by mouth daily. 02/13/17   Alfonse Spruce, FNP  atorvastatin (LIPITOR) 40 MG tablet Take 1 tablet (40 mg total) by mouth daily. To help lower cholesterol 03/23/19   Fulp, Cammie, MD  carvedilol (COREG) 3.125 MG tablet TAKE ONE TABLET BY MOUTH TWICE DAY to help lower blood pressure Patient taking differently: Take 3.125 mg by mouth in the morning and at bedtime.  03/23/19   Fulp, Cammie, MD  cyclobenzaprine (FLEXERIL) 5 MG tablet Take 1 tablet (5 mg total) by mouth every 8 (eight) hours as needed for muscle spasms. 02/13/17   Alfonse Spruce, FNP  hydrochlorothiazide (HYDRODIURIL) 25 MG tablet Take 1 tablet (25 mg total) by mouth daily. To help lower blood pressure Patient taking differently: Take 25 mg by mouth daily.  03/23/19   Fulp, Cammie, MD  lisinopril (ZESTRIL) 40 MG tablet Take 1 tablet (40 mg total) by mouth daily. To help lower blood pressure Patient taking differently: Take 40 mg by mouth daily.  03/23/19   Fulp, Cammie, MD  ranitidine (ZANTAC) 150 MG tablet Take 150 mg by mouth daily as needed for heartburn.    [provider]     Critical care time: 38 minutes    Chesley Mires, MD [place] Pager - [phone] 10/02/2019, 1:24 PM

## 2019-10-02 NOTE — Transfer of Care (Signed)
Immediate Anesthesia Transfer of Care Note  Patient: Riley Wagner  Procedure(s) Performed: IR WITH ANESTHESIA (N/A )  Patient Location: ICU  Anesthesia Type:General  Level of Consciousness: sedated and Patient remains intubated per anesthesia plan  Airway & Oxygen Therapy: Patient remains intubated per anesthesia plan and Patient placed on Ventilator (see vital sign flow sheet for setting)  Post-op Assessment: Report given to RN and Post -op Vital signs reviewed and stable  Post vital signs: Reviewed and stable  Last Vitals:  Vitals Value Taken Time  BP 132/73 10/02/19 1229  Temp    Pulse 73 10/02/19 1229  Resp 18 10/02/19 1229  SpO2 96 % 10/02/19 1229    Last Pain:  Vitals:   10/02/19 0828  TempSrc: Oral         Complications: No apparent anesthesia complications

## 2019-10-02 NOTE — Progress Notes (Addendum)
Per pharm tech- Paula Compton is the pt's significant other's niece. She is not the blood daughter or relative. She has been trying to reach family but has been unsuccessful. Pt's S.o. has been with him for 20+ years. Gray Maugeri, Rande Brunt, RN   Paula Compton called to tell me the patient does have children but they have not been in the picture. She is trying to reach them any way possible. Rollene Fare (s.o.) is an amputee with a history of stroke that does not comprehend information easily therefore, call Paula Compton with updates and she will make sure her aunt gets the information. Raisha plans to visit Monday evening after she gets off work.  Jakyri Brunkhorst, Rande Brunt, RN    Newest Update 1709: Pt's legal daughter Michio Bodenschatz called for updates very frantic. She is one of three of the pt's kids. Her info is in the chart. She created a password for the pt's chart and requested that all information go through her and her brother Legrand Como. She says Legrand Como is on the way to the hospital now. They will be the designated visitors. I will update Paula Compton that she must go through the kids for updates on the pt and is not able to visit at this time. MD notified that Erby Canseco would like an update. Seraya Jobst, Rande Brunt, RN

## 2019-10-02 NOTE — Progress Notes (Signed)
CXR shows ETT at level of carina with LLL ATX.  Will have RT retract 2 cm.   Chesley Mires, MD [place] Pager - [phone] 10/02/2019, 3:01 PM

## 2019-10-02 NOTE — Anesthesia Preprocedure Evaluation (Addendum)
Anesthesia Evaluation  Patient identified by MRN, date of birth, ID band Patient confused    Reviewed: Allergy & Precautions, Patient's Chart, lab work & pertinent test results, Unable to perform ROS - Chart review onlyPreop documentation limited or incomplete due to emergent nature of procedure.  History of Anesthesia Complications Negative for: history of anesthetic complications  Airway   TM Distance: >3 FB    Comment:  Unable to complete exam due to mental status  Dental   Pulmonary former smoker,     + decreased breath sounds      Cardiovascular hypertension, Pt. on medications and Pt. on home beta blockers + Past MI   Rhythm:Regular Rate:Tachycardia   SBP > 220    Neuro/Psych CVA negative psych ROS   GI/Hepatic negative GI ROS, Neg liver ROS,   Endo/Other  negative endocrine ROS  Renal/GU CRFRenal disease     Musculoskeletal negative musculoskeletal ROS (+)   Abdominal   Peds  Hematology negative hematology ROS (+)   Anesthesia Other Findings   Reproductive/Obstetrics                            Anesthesia Physical Anesthesia Plan  ASA: IV and emergent  Anesthesia Plan: General   Post-op Pain Management:    Induction: Intravenous  PONV Risk Score and Plan: 2 and Treatment may vary due to age or medical condition, Ondansetron and Dexamethasone  Airway Management Planned: Oral ETT  Additional Equipment: Arterial line  Intra-op Plan:   Post-operative Plan: Post-operative intubation/ventilation  Informed Consent:     Only emergency history available and History available from chart only  Plan Discussed with: CRNA and Anesthesiologist  Anesthesia Plan Comments:        Anesthesia Quick Evaluation

## 2019-10-03 ENCOUNTER — Inpatient Hospital Stay (HOSPITAL_COMMUNITY): Payer: Medicare Other

## 2019-10-03 ENCOUNTER — Inpatient Hospital Stay: Payer: Self-pay

## 2019-10-03 ENCOUNTER — Encounter: Payer: Self-pay | Admitting: *Deleted

## 2019-10-03 DIAGNOSIS — J9601 Acute respiratory failure with hypoxia: Secondary | ICD-10-CM

## 2019-10-03 DIAGNOSIS — J9600 Acute respiratory failure, unspecified whether with hypoxia or hypercapnia: Secondary | ICD-10-CM

## 2019-10-03 DIAGNOSIS — I610 Nontraumatic intracerebral hemorrhage in hemisphere, subcortical: Secondary | ICD-10-CM

## 2019-10-03 DIAGNOSIS — J9602 Acute respiratory failure with hypercapnia: Secondary | ICD-10-CM

## 2019-10-03 DIAGNOSIS — I63512 Cerebral infarction due to unspecified occlusion or stenosis of left middle cerebral artery: Secondary | ICD-10-CM

## 2019-10-03 DIAGNOSIS — I609 Nontraumatic subarachnoid hemorrhage, unspecified: Secondary | ICD-10-CM

## 2019-10-03 DIAGNOSIS — F1410 Cocaine abuse, uncomplicated: Secondary | ICD-10-CM

## 2019-10-03 DIAGNOSIS — I6389 Other cerebral infarction: Secondary | ICD-10-CM

## 2019-10-03 DIAGNOSIS — Z8673 Personal history of transient ischemic attack (TIA), and cerebral infarction without residual deficits: Secondary | ICD-10-CM

## 2019-10-03 DIAGNOSIS — G936 Cerebral edema: Secondary | ICD-10-CM

## 2019-10-03 DIAGNOSIS — I639 Cerebral infarction, unspecified: Secondary | ICD-10-CM

## 2019-10-03 LAB — CBC
HCT: 41.5 % (ref 39.0–52.0)
Hemoglobin: 14 g/dL (ref 13.0–17.0)
MCH: 31.6 pg (ref 26.0–34.0)
MCHC: 33.7 g/dL (ref 30.0–36.0)
MCV: 93.7 fL (ref 80.0–100.0)
Platelets: 269 10*3/uL (ref 150–400)
RBC: 4.43 MIL/uL (ref 4.22–5.81)
RDW: 14.9 % (ref 11.5–15.5)
WBC: 9.4 10*3/uL (ref 4.0–10.5)
nRBC: 0 % (ref 0.0–0.2)

## 2019-10-03 LAB — BASIC METABOLIC PANEL
Anion gap: 9 (ref 5–15)
BUN: 12 mg/dL (ref 8–23)
CO2: 19 mmol/L — ABNORMAL LOW (ref 22–32)
Calcium: 8.3 mg/dL — ABNORMAL LOW (ref 8.9–10.3)
Chloride: 117 mmol/L — ABNORMAL HIGH (ref 98–111)
Creatinine, Ser: 1.37 mg/dL — ABNORMAL HIGH (ref 0.61–1.24)
GFR calc Af Amer: >60 mL/min (ref >60)
GFR calc non Af Amer: 54 mL/min — ABNORMAL LOW (ref >60)
Glucose, Bld: 145 mg/dL — ABNORMAL HIGH (ref 70–99)
Potassium: 3.6 mmol/L (ref 3.5–5.1)
Sodium: 145 mmol/L (ref 135–145)

## 2019-10-03 LAB — MAGNESIUM
Magnesium: 2.2 mg/dL (ref 1.7–2.4)
Magnesium: 2.2 mg/dL (ref 1.7–2.4)

## 2019-10-03 LAB — LIPID PANEL
Cholesterol: 176 mg/dL (ref 0–200)
HDL: 30 mg/dL — ABNORMAL LOW (ref >40)
LDL Cholesterol: UNDETERMINED mg/dL (ref 0–99)
Total CHOL/HDL Ratio: 5.9 RATIO
Triglycerides: 471 mg/dL — ABNORMAL HIGH (ref <150)
VLDL: UNDETERMINED mg/dL (ref 0–40)

## 2019-10-03 LAB — PHOSPHORUS
Phosphorus: 1.9 mg/dL — ABNORMAL LOW (ref 2.5–4.6)
Phosphorus: 1.8 mg/dL — ABNORMAL LOW (ref 2.5–4.6)

## 2019-10-03 LAB — TRIGLYCERIDES: Triglycerides: 476 mg/dL — ABNORMAL HIGH (ref <150)

## 2019-10-03 LAB — ECHOCARDIOGRAM COMPLETE
Height: 67 in
Weight: 2740.76 oz

## 2019-10-03 LAB — LDL CHOLESTEROL, DIRECT: Direct LDL: 95.3 mg/dL (ref 0–99)

## 2019-10-03 LAB — SODIUM
Sodium: 148 mmol/L — ABNORMAL HIGH (ref 135–145)
Sodium: 152 mmol/L — ABNORMAL HIGH (ref 135–145)
Sodium: 156 mmol/L — ABNORMAL HIGH (ref 135–145)

## 2019-10-03 MED ORDER — VITAL HIGH PROTEIN PO LIQD
1000.0000 mL | ORAL | Status: DC
  Administered 2019-10-03 – 2019-10-05 (×2): 1000 mL

## 2019-10-03 MED ORDER — ADULT MULTIVITAMIN W/MINERALS CH
1.0000 | ORAL_TABLET | Freq: Every day | ORAL | Status: DC
  Administered 2019-10-03 – 2019-10-26 (×24): 1
  Filled 2019-10-03 (×24): qty 1

## 2019-10-03 MED ORDER — PRO-STAT SUGAR FREE PO LIQD
60.0000 mL | Freq: Two times a day (BID) | ORAL | Status: DC
  Administered 2019-10-03 – 2019-10-04 (×3): 60 mL
  Filled 2019-10-03 (×4): qty 60

## 2019-10-03 MED ORDER — CARVEDILOL 3.125 MG PO TABS
3.1250 mg | ORAL_TABLET | Freq: Two times a day (BID) | ORAL | Status: DC
  Administered 2019-10-03 – 2019-10-04 (×2): 3.125 mg
  Filled 2019-10-03 (×2): qty 1

## 2019-10-03 MED ORDER — SODIUM CHLORIDE 0.9% FLUSH: 10.0000 mL | INTRAVENOUS | Status: DC | PRN

## 2019-10-03 MED ORDER — PRO-STAT SUGAR FREE PO LIQD
30.0000 mL | Freq: Two times a day (BID) | ORAL | Status: DC
  Administered 2019-10-03: 30 mL
  Filled 2019-10-03: qty 30

## 2019-10-03 MED ORDER — SODIUM CHLORIDE 0.9 % IV SOLN
0.5000 mg/min | INTRAVENOUS | Status: DC
  Filled 2019-10-03: qty 100

## 2019-10-03 MED ORDER — HYDRALAZINE HCL 20 MG/ML IJ SOLN
20.0000 mg | INTRAMUSCULAR | Status: DC | PRN
  Administered 2019-10-03 – 2019-10-06 (×4): 20 mg via INTRAVENOUS
  Filled 2019-10-03 (×4): qty 1

## 2019-10-03 MED ORDER — VITAL HIGH PROTEIN PO LIQD: 1000.0000 mL | ORAL | Status: DC

## 2019-10-03 MED ORDER — LISINOPRIL 20 MG PO TABS
20.0000 mg | ORAL_TABLET | Freq: Every day | ORAL | Status: DC
  Filled 2019-10-03: qty 1

## 2019-10-03 MED ORDER — LISINOPRIL 20 MG PO TABS
20.0000 mg | ORAL_TABLET | Freq: Every day | ORAL | Status: DC
  Administered 2019-10-03 – 2019-10-04 (×2): 20 mg
  Filled 2019-10-03: qty 1

## 2019-10-03 MED ORDER — HYDRALAZINE HCL 50 MG PO TABS
100.0000 mg | ORAL_TABLET | Freq: Four times a day (QID) | ORAL | Status: DC
  Administered 2019-10-03 – 2019-10-05 (×6): 100 mg
  Filled 2019-10-03 (×8): qty 2

## 2019-10-03 NOTE — Progress Notes (Addendum)
Referring Physician(s): Code Stroke- Greta Doom  Supervising Physician: Pedro Earls  Patient Status:  [facility] - In-pt  Chief Complaint: None- intubated with sedation  Subjective:  History of acute CVA s/p cerebral arteriogram with emergent mechanical thrombectomy of proximal left MCA M1 occlusion, along with balloon angioplasty of right ICA/MCA stenosis, followed by coil embolization of right ICA terminus secondary to persistent bleeding (seen on first angiogram prior to thrombectomy) 10/02/2019 by Dr. Karenann Cai. Patient laying in bed intubated with sedation. He does not respond to voice and does not follow simple commands. Accompanied by son at bedside. Left side withdraws from pain, no movements of right side.   Allergies: Patient has no known allergies.  Medications: Prior to Admission medications   Medication Sig Start Date End Date Taking? Authorizing Provider  lisinopril (ZESTRIL) 40 MG tablet Take 1 tablet (40 mg total) by mouth daily. To help lower blood pressure Patient taking differently: Take 40 mg by mouth daily.  03/23/19  Yes Fulp, Cammie, MD  acetaminophen (TYLENOL) 500 MG tablet Take 2 tablets (1,000 mg total) by mouth every 6 (six) hours as needed. Patient not taking: Reported on 10/02/2019 02/13/17   Alfonse Spruce, FNP  amLODipine (NORVASC) 10 MG tablet Take one pill one time daily to help lower blood pressure Patient not taking: Reported on 10/02/2019 03/23/19   Antony Blackbird, MD  aspirin EC 81 MG tablet Take 1 tablet (81 mg total) by mouth daily. Patient not taking: Reported on 10/02/2019 02/13/17   Alfonse Spruce, FNP  atorvastatin (LIPITOR) 40 MG tablet Take 1 tablet (40 mg total) by mouth daily. To help lower cholesterol Patient not taking: Reported on 10/02/2019 03/23/19   Fulp, Cammie, MD  carvedilol (COREG) 3.125 MG tablet TAKE ONE TABLET BY MOUTH TWICE DAY to help lower blood pressure Patient not taking: Reported  on 10/02/2019 03/23/19   Fulp, Cammie, MD  cyclobenzaprine (FLEXERIL) 5 MG tablet Take 1 tablet (5 mg total) by mouth every 8 (eight) hours as needed for muscle spasms. Patient not taking: Reported on 10/02/2019 02/13/17   Alfonse Spruce, FNP  hydrochlorothiazide (HYDRODIURIL) 25 MG tablet Take 1 tablet (25 mg total) by mouth daily. To help lower blood pressure Patient not taking: Reported on 10/02/2019 03/23/19   Antony Blackbird, MD     Vital Signs: BP 92/69   Pulse 72   Temp 99 F (37.2 C) (Axillary)   Resp (!) 21   Ht 5\' 7"  (1.702 m)   Wt 171 lb 4.8 oz (77.7 kg)   SpO2 98%   BMI 26.83 kg/m   Physical Exam Vitals and nursing note reviewed.  Constitutional:      General: He is not in acute distress.    Comments: Intubated with sedation.  Pulmonary:     Effort: Pulmonary effort is normal. No respiratory distress.     Comments: Intubated with sedation. Skin:    General: Skin is warm and dry.     Comments: Right groin incision soft without active bleeding or hematoma.  Neurological:     Comments: Intubated with sedation. PERRL bilaterally. Left side withdraws from pain, no movements of right side. Distal pulses (PTs) palpable bilaterally with Doppler.  Psychiatric:     Comments: Intubated with sedation.     Imaging: CT Code Stroke CTA Head W/WO contrast  Result Date: 10/02/2019 CLINICAL DATA:  Acute presentation with speech disturbance EXAM: CT ANGIOGRAPHY HEAD AND NECK CT PERFUSION BRAIN TECHNIQUE: Multidetector CT imaging  of the head and neck was performed using the standard protocol during bolus administration of intravenous contrast. Multiplanar CT image reconstructions and MIPs were obtained to evaluate the vascular anatomy. Carotid stenosis measurements (when applicable) are obtained utilizing NASCET criteria, using the distal internal carotid diameter as the denominator. Multiphase CT imaging of the brain was performed following IV bolus contrast injection. Subsequent  parametric perfusion maps were calculated using RAPID software. CONTRAST:  131mL OMNIPAQUE IOHEXOL 350 MG/ML SOLN COMPARISON:  Head CT earlier same day FINDINGS: FINDINGS CTA NECK FINDINGS Aortic arch: Aortic atherosclerosis. Branching pattern is normal without origin stenosis. Right carotid system: Common carotid artery shows atherosclerotic plaque with stenosis up to 40% along its course to the bifurcation. Carotid bifurcation shows soft and calcified plaque. No stenosis of the ICA compared to the more distal cervical ICA territory. Left carotid system: Common carotid artery shows atherosclerotic plaque with narrowing along its course up to 50%. Soft and calcified plaque at the carotid bifurcation and ICA bulb. Minimal diameter in the ICA bulb is 2 mm. Compared to a more distal cervical ICA diameter of 3 mm, this indicates a 30-40% reduction, though the vessel size is likely reduced due to decreased outflow. Vertebral arteries: Right vertebral artery stenosis of 70% or greater. Beyond that, the vessel is patent through the cervical region to the foramen magnum. Severe stenosis of the left vertebral artery origin common nearly occluded, but with ongoing flow/patency to the foramen magnum. Skeleton: Ordinary cervical spondylosis. Other neck: No mass or lymphadenopathy. Upper chest: Negative Review of the MIP images confirms the above findings CTA HEAD FINDINGS Anterior circulation: Right internal carotid artery is patent through the skull base and siphon regions. Advanced atherosclerotic calcification in the carotid siphon region with serial stenoses on the order of 70%. Supraclinoid ICA is patent. Right anterior and middle cerebral vessels are patent. Widespread atherosclerotic narrowing and irregularity. Left internal carotid artery this severely disease at the skull base and siphon region with severe atherosclerotic narrowing and irregularity. Occlusion at the carotid terminus affecting the complete left MCA  distribution. Small amount of collateral flow. Patient has an azygos anterior cerebral artery with apparent intact supply to the distal ACA regions. Posterior circulation: Both vertebral arteries are patent at the foramen magnum level. 70% stenosis of the distal left V4 segment. Both vertebral arteries do reach the basilar. No focal basilar stenosis. Superior cerebellar and posterior cerebral branch vessels are patent. Venous sinuses: Patent and normal. Anatomic variants: None other significant. Review of the MIP images confirms the above findings CT Brain Perfusion Findings: ASPECTS: 8 or possibly 7 CBF (<30%) Volume: 26mL Perfusion (Tmax>6.0s) volume: 122mL Mismatch Volume: 141mL Infarction Location:Left insula and frontal operculum and underlying white matter Hypoperfusion index of 0.5 suggests poor collateral flow. IMPRESSION: Occlusion of the left carotid terminus. 31 cc region infarct core in the left insula and operculum. 105 cc of penumbra at risk brain throughout the remainder of the left MCA territory. Widespread atherosclerotic disease. Stenoses of both common carotid arteries along their course to the bifurcations, 40% on the right and 50% on the left. Extensive soft and calcified plaque at both carotid bifurcations and ICA bulbs. No measurable stenosis on the right. 30-40% stenosis on the left, possibly under measured because of decreased size of the more distal internal carotid artery. Severe atherosclerotic disease in both carotid siphon regions with severe Earl severe stenoses. Atherosclerotic narrowing and irregularity throughout the right MCA vessels and both PCA vessels. Azygos anterior cerebral anatomy results in protection  of the anterior cerebral territories. These results were communicated to Dr. Leonel Ramsay at 9:01 amon 5/16/2021by text page via the [facility] messaging system. Electronically Signed   By: Nelson Chimes M.D.   On: 10/02/2019 09:04   CT HEAD WO CONTRAST  Result Date:  10/03/2019 CLINICAL DATA:  Stroke follow-up EXAM: CT HEAD WITHOUT CONTRAST TECHNIQUE: Contiguous axial images were obtained from the base of the skull through the vertex without intravenous contrast. COMPARISON:  Yesterday FINDINGS: Brain: Cytotoxic edema throughout the left MCA territory. Unchanged left more than right basal ganglia contrast staining. Decreased subarachnoid space contrast in the left occipital region and dependent posterior fossa. No herniation or entrapment. There is severe chronic small vessel ischemia and remote small vessel infarcts. Generalized atrophy. Vascular: Distal left ICA to MCA and ACA embolization. Skull: No acute finding Sinuses/Orbits: Generalized sinusitis. IMPRESSION: 1. Diminishing subarachnoid contrast. 2. Left MCA territory infarct without significant mass effect. Electronically Signed   By: Monte Fantasia M.D.   On: 10/03/2019 08:27   CT HEAD WO CONTRAST  Result Date: 10/02/2019 CLINICAL DATA:  Encephalopathy EXAM: CT HEAD WITHOUT CONTRAST TECHNIQUE: Contiguous axial images were obtained from the base of the skull through the vertex without intravenous contrast. COMPARISON:  None. FINDINGS: Brain: Stroke there is a large amount of contrast staining within the left basal ganglia and in the subarachnoid space adjacent to the left occipital lobe. There is a small amount of staining in the right basal ganglia. Extra-axial contrast along the tentorium cerebelli. There is a large amount of cytotoxic edema within the left MCA territory. There is periventricular hypoattenuation compatible with chronic microvascular disease. 3 mm of leftward midline shift. Vascular: Coiling of the left ICA terminus. Skull: Normal. Negative for fracture or focal lesion. Sinuses/Orbits: No acute finding. Other: None. IMPRESSION: 1. Large amount of hemorrhage/contrast staining within the left basal ganglia and in the posterior subdural and subarachnoid spaces. 2. Large amount of cytotoxic edema  within the left MCA territory with 3 mm of leftward midline shift. Electronically Signed   By: Ulyses Jarred M.D.   On: 10/02/2019 20:11   CT Code Stroke CTA Neck W/WO contrast  Result Date: 10/02/2019 CLINICAL DATA:  Acute presentation with speech disturbance EXAM: CT ANGIOGRAPHY HEAD AND NECK CT PERFUSION BRAIN TECHNIQUE: Multidetector CT imaging of the head and neck was performed using the standard protocol during bolus administration of intravenous contrast. Multiplanar CT image reconstructions and MIPs were obtained to evaluate the vascular anatomy. Carotid stenosis measurements (when applicable) are obtained utilizing NASCET criteria, using the distal internal carotid diameter as the denominator. Multiphase CT imaging of the brain was performed following IV bolus contrast injection. Subsequent parametric perfusion maps were calculated using RAPID software. CONTRAST:  155mL OMNIPAQUE IOHEXOL 350 MG/ML SOLN COMPARISON:  Head CT earlier same day FINDINGS: FINDINGS CTA NECK FINDINGS Aortic arch: Aortic atherosclerosis. Branching pattern is normal without origin stenosis. Right carotid system: Common carotid artery shows atherosclerotic plaque with stenosis up to 40% along its course to the bifurcation. Carotid bifurcation shows soft and calcified plaque. No stenosis of the ICA compared to the more distal cervical ICA territory. Left carotid system: Common carotid artery shows atherosclerotic plaque with narrowing along its course up to 50%. Soft and calcified plaque at the carotid bifurcation and ICA bulb. Minimal diameter in the ICA bulb is 2 mm. Compared to a more distal cervical ICA diameter of 3 mm, this indicates a 30-40% reduction, though the vessel size is likely reduced due to decreased outflow. Vertebral  arteries: Right vertebral artery stenosis of 70% or greater. Beyond that, the vessel is patent through the cervical region to the foramen magnum. Severe stenosis of the left vertebral artery origin  common nearly occluded, but with ongoing flow/patency to the foramen magnum. Skeleton: Ordinary cervical spondylosis. Other neck: No mass or lymphadenopathy. Upper chest: Negative Review of the MIP images confirms the above findings CTA HEAD FINDINGS Anterior circulation: Right internal carotid artery is patent through the skull base and siphon regions. Advanced atherosclerotic calcification in the carotid siphon region with serial stenoses on the order of 70%. Supraclinoid ICA is patent. Right anterior and middle cerebral vessels are patent. Widespread atherosclerotic narrowing and irregularity. Left internal carotid artery this severely disease at the skull base and siphon region with severe atherosclerotic narrowing and irregularity. Occlusion at the carotid terminus affecting the complete left MCA distribution. Small amount of collateral flow. Patient has an azygos anterior cerebral artery with apparent intact supply to the distal ACA regions. Posterior circulation: Both vertebral arteries are patent at the foramen magnum level. 70% stenosis of the distal left V4 segment. Both vertebral arteries do reach the basilar. No focal basilar stenosis. Superior cerebellar and posterior cerebral branch vessels are patent. Venous sinuses: Patent and normal. Anatomic variants: None other significant. Review of the MIP images confirms the above findings CT Brain Perfusion Findings: ASPECTS: 8 or possibly 7 CBF (<30%) Volume: 58mL Perfusion (Tmax>6.0s) volume: 18mL Mismatch Volume: 165mL Infarction Location:Left insula and frontal operculum and underlying white matter Hypoperfusion index of 0.5 suggests poor collateral flow. IMPRESSION: Occlusion of the left carotid terminus. 31 cc region infarct core in the left insula and operculum. 105 cc of penumbra at risk brain throughout the remainder of the left MCA territory. Widespread atherosclerotic disease. Stenoses of both common carotid arteries along their course to the  bifurcations, 40% on the right and 50% on the left. Extensive soft and calcified plaque at both carotid bifurcations and ICA bulbs. No measurable stenosis on the right. 30-40% stenosis on the left, possibly under measured because of decreased size of the more distal internal carotid artery. Severe atherosclerotic disease in both carotid siphon regions with severe Earl severe stenoses. Atherosclerotic narrowing and irregularity throughout the right MCA vessels and both PCA vessels. Azygos anterior cerebral anatomy results in protection of the anterior cerebral territories. These results were communicated to Dr. Leonel Ramsay at 9:01 amon 5/16/2021by text page via the [facility] messaging system. Electronically Signed   By: Nelson Chimes M.D.   On: 10/02/2019 09:04   CT Code Stroke Cerebral Perfusion with contrast  Result Date: 10/02/2019 CLINICAL DATA:  Acute presentation with speech disturbance EXAM: CT ANGIOGRAPHY HEAD AND NECK CT PERFUSION BRAIN TECHNIQUE: Multidetector CT imaging of the head and neck was performed using the standard protocol during bolus administration of intravenous contrast. Multiplanar CT image reconstructions and MIPs were obtained to evaluate the vascular anatomy. Carotid stenosis measurements (when applicable) are obtained utilizing NASCET criteria, using the distal internal carotid diameter as the denominator. Multiphase CT imaging of the brain was performed following IV bolus contrast injection. Subsequent parametric perfusion maps were calculated using RAPID software. CONTRAST:  155mL OMNIPAQUE IOHEXOL 350 MG/ML SOLN COMPARISON:  Head CT earlier same day FINDINGS: FINDINGS CTA NECK FINDINGS Aortic arch: Aortic atherosclerosis. Branching pattern is normal without origin stenosis. Right carotid system: Common carotid artery shows atherosclerotic plaque with stenosis up to 40% along its course to the bifurcation. Carotid bifurcation shows soft and calcified plaque. No stenosis of the ICA  compared  to the more distal cervical ICA territory. Left carotid system: Common carotid artery shows atherosclerotic plaque with narrowing along its course up to 50%. Soft and calcified plaque at the carotid bifurcation and ICA bulb. Minimal diameter in the ICA bulb is 2 mm. Compared to a more distal cervical ICA diameter of 3 mm, this indicates a 30-40% reduction, though the vessel size is likely reduced due to decreased outflow. Vertebral arteries: Right vertebral artery stenosis of 70% or greater. Beyond that, the vessel is patent through the cervical region to the foramen magnum. Severe stenosis of the left vertebral artery origin common nearly occluded, but with ongoing flow/patency to the foramen magnum. Skeleton: Ordinary cervical spondylosis. Other neck: No mass or lymphadenopathy. Upper chest: Negative Review of the MIP images confirms the above findings CTA HEAD FINDINGS Anterior circulation: Right internal carotid artery is patent through the skull base and siphon regions. Advanced atherosclerotic calcification in the carotid siphon region with serial stenoses on the order of 70%. Supraclinoid ICA is patent. Right anterior and middle cerebral vessels are patent. Widespread atherosclerotic narrowing and irregularity. Left internal carotid artery this severely disease at the skull base and siphon region with severe atherosclerotic narrowing and irregularity. Occlusion at the carotid terminus affecting the complete left MCA distribution. Small amount of collateral flow. Patient has an azygos anterior cerebral artery with apparent intact supply to the distal ACA regions. Posterior circulation: Both vertebral arteries are patent at the foramen magnum level. 70% stenosis of the distal left V4 segment. Both vertebral arteries do reach the basilar. No focal basilar stenosis. Superior cerebellar and posterior cerebral branch vessels are patent. Venous sinuses: Patent and normal. Anatomic variants: None other  significant. Review of the MIP images confirms the above findings CT Brain Perfusion Findings: ASPECTS: 8 or possibly 7 CBF (<30%) Volume: 65mL Perfusion (Tmax>6.0s) volume: 1105mL Mismatch Volume: 142mL Infarction Location:Left insula and frontal operculum and underlying white matter Hypoperfusion index of 0.5 suggests poor collateral flow. IMPRESSION: Occlusion of the left carotid terminus. 31 cc region infarct core in the left insula and operculum. 105 cc of penumbra at risk brain throughout the remainder of the left MCA territory. Widespread atherosclerotic disease. Stenoses of both common carotid arteries along their course to the bifurcations, 40% on the right and 50% on the left. Extensive soft and calcified plaque at both carotid bifurcations and ICA bulbs. No measurable stenosis on the right. 30-40% stenosis on the left, possibly under measured because of decreased size of the more distal internal carotid artery. Severe atherosclerotic disease in both carotid siphon regions with severe Earl severe stenoses. Atherosclerotic narrowing and irregularity throughout the right MCA vessels and both PCA vessels. Azygos anterior cerebral anatomy results in protection of the anterior cerebral territories. These results were communicated to Dr. Leonel Ramsay at 9:01 amon 5/16/2021by text page via the [facility] messaging system. Electronically Signed   By: Nelson Chimes M.D.   On: 10/02/2019 09:04   DG Chest Port 1 View  Result Date: 10/02/2019 CLINICAL DATA:  Endotracheal 2 placement. EXAM: PORTABLE CHEST 1 VIEW COMPARISON:  None. FINDINGS: Endotracheal tube tip projects at the carina near the origin of the right mainstem bronchus. Recommend retracting 1-2 cm. Nasal/orogastric tube passes below the diaphragm, well within the stomach. Cardiac silhouette is normal in size. No mediastinal or hilar masses. There is opacity at the left lung base accentuated by low lung volumes, which may reflect atelectasis or infection.  Remainder of the lungs is clear. No convincing pleural effusion and no pneumothorax.  Skeletal structures are grossly intact. IMPRESSION: 1. Endotracheal tube tip projects at the carina, at the origin of the right mainstem bronchus. Recommend retracting 1-2 cm for more optimal positioning. Critical Value/emergent results were called by telephone at the time of interpretation on 10/02/2019 at 2:48 pm to Sarah, the patient's nurse, who verbally acknowledged these results. 2. Well-positioned nasal/orogastric tube. 3. Left lung base opacity which may reflect atelectasis or pneumonia. Electronically Signed   By: Lajean Manes M.D.   On: 10/02/2019 14:49   DG Abd Portable 1V  Result Date: 10/02/2019 CLINICAL DATA:  Orogastric tube placement EXAM: PORTABLE ABDOMEN - 1 VIEW COMPARISON:  Chest x-ray same date FINDINGS: Scattered gas-filled loops of bowel throughout the abdomen. Gastric tube coiled in the stomach, tip directed towards the gastric fundus. Spinal degenerative changes. No acute bone finding. Limited assessment excludes the pelvis. IMPRESSION: 1. Gastric tube coiled in the stomach, tip directed towards the gastric fundus. 2. Scattered gas-filled loops of bowel throughout the abdomen. Correlate with any signs of ileus. Electronically Signed   By: Zetta Bills M.D.   On: 10/02/2019 14:41   CT HEAD CODE STROKE WO CONTRAST  Result Date: 10/02/2019 CLINICAL DATA:  Code stroke. Code stroke. Gaze disturbance. Aphasia. EXAM: CT HEAD WITHOUT CONTRAST TECHNIQUE: Contiguous axial images were obtained from the base of the skull through the vertex without intravenous contrast. COMPARISON:  None. FINDINGS: Brain: Background pattern of atrophy and chronic small-vessel ischemic changes. Probably old left parietal cortical and subcortical infarction. Acute appearing cytotoxic edema in the insula and frontal operculum on the left consistent with a acute/recent infarction. No evidence of hemorrhage or mass effect. No  hydrocephalus or extra-axial collection. Vascular: There is atherosclerotic calcification of the major vessels at the base of the brain. Skull: Negative Sinuses/Orbits: Mucosal inflammatory changes.  Orbits negative. Other: None ASPECTS ([place] Stroke Program Early CT Score) - Ganglionic level infarction (caudate, lentiform nuclei, internal capsule, insula, M1-M3 cortex): 5 - Supraganglionic infarction (M4-M6 cortex): 3, allowing that the left parietal changes are old. Total score (0-10 with 10 being normal): 8 IMPRESSION: 1. Acute cytotoxic edema in the left insula and frontal operculum. Left parietal cortical and subcortical abnormality that I favor is old. Some possibility a could be subacute. Extensive chronic small-vessel ischemic changes elsewhere. No hemorrhage. 2. ASPECTS is 8 3. These results were communicated to Dr. Leonel Ramsay at 8:36 amon 5/16/2021by text page via the [facility] messaging system. Electronically Signed   By: Nelson Chimes M.D.   On: 10/02/2019 08:37   [place] EKG SITE RITE  Result Date: 10/03/2019 If Site Rite image not attached, placement could not be confirmed due to current cardiac rhythm.   Labs:  CBC: Recent Labs    10/02/19 0826 10/02/19 0831 10/02/19 1332 10/03/19 0213  WBC 11.1*  --   --  9.4  HGB 15.7 17.0 16.0 14.0  HCT 47.1 50.0 47.0 41.5  PLT 288  --   --  269    COAGS: Recent Labs    10/02/19 0826  INR 1.1  APTT 29    BMP: Recent Labs    10/02/19 0826 10/02/19 0826 10/02/19 0831 10/02/19 0831 10/02/19 1332 10/02/19 2038 10/03/19 0213 10/03/19 0802  NA 141   < > 144   < > 145 141 145 148*  K 3.4*  --  3.2*  --  3.0*  --  3.6  --   CL 104  --  108  --   --   --  117*  --  CO2 22  --   --   --   --   --  19*  --   GLUCOSE 116*  --  110*  --   --   --  145*  --   BUN 21  --  28*  --   --   --  12  --   CALCIUM 9.4  --   --   --   --   --  8.3*  --   CREATININE 1.60*  --  1.50*  --   --   --  1.37*  --   GFRNONAA 45*  --   --   --   --   --   54*  --   GFRAA 52*  --   --   --   --   --  >60  --    < > = values in this interval not displayed.    LIVER FUNCTION TESTS: Recent Labs    10/02/19 0826  BILITOT 1.3*  AST 43*  ALT 28  ALKPHOS 36  PROT 7.0  ALBUMIN 4.0    Assessment and Plan:  History of acute CVA s/p cerebral arteriogram with emergent mechanical thrombectomy of proximal left MCA M1 occlusion, along with balloon angioplasty of right ICA/MCA stenosis, followed by coil embolization of right ICA terminus secondary to persistent bleeding (seen on first angiogram prior to thrombectomy) 10/02/2019 by Dr. Karenann Cai. Patient's condition stable- remains intubated/sedated, left side withdraws from pain, no movements of right side. Son updated on patient's condition by Dr. Karenann Cai, all questions answered and concerns addressed. Right groin incision stable, distal pulses (PTs) palpable bilaterally with Doppler. Further plans per neurology/CCM- appreciate and agree with management. NIR to follow.   Electronically Signed: Earley Abide, PA-C 10/03/2019, 10:58 AM   I spent a total of 25 Minutes at the the patient's bedside AND on the patient's hospital floor or unit, greater than 50% of which was counseling/coordinating care for CVA s/p revascularization.

## 2019-10-03 NOTE — Progress Notes (Signed)
  Echocardiogram 2D Echocardiogram has been performed.  Riley Wagner 10/03/2019, 2:29 PM

## 2019-10-03 NOTE — Progress Notes (Signed)
Dr. Erlinda Hong spoke with family at length about pt's condition and beginning to address goals of care.  Pt's son and 2 daughters present.  Family decided on DNR for pt and will further discuss a plan after CT scan tomorrow morning.

## 2019-10-03 NOTE — Progress Notes (Signed)
STROKE TEAM PROGRESS NOTE   INTERVAL HISTORY Pt son and RN at bedside. Pt 2 daughters are coming today from [place] and [place]. Pt still intubated on vent and sedation. Moving left side purposefully. CT repeat showed left large MCA infarct with ICH and SAH. No significant midline shift yet. I had long discussion with son at bedside, updated pt current condition, treatment plan and potential prognosis, and answered all the questions. He expressed understanding and appreciation. He will relay info and discuss with his sisters.     Vitals:   10/03/19 0715 10/03/19 0800 10/03/19 0833 10/03/19 0900  BP: 125/71 129/73 132/72 129/77  Pulse: 87 86 83 75  Resp: 20 (!) 23 18 18   Temp:  99 F (37.2 C)    TempSrc:  Axillary    SpO2: 99% 99% 99% 99%  Weight:      Height:        CBC:  Recent Labs  Lab 10/02/19 0826 10/02/19 0831 10/02/19 1332 10/03/19 0213  WBC 11.1*  --   --  9.4  NEUTROABS 8.9*  --   --   --   HGB 15.7   < > 16.0 14.0  HCT 47.1   < > 47.0 41.5  MCV 93.3  --   --  93.7  PLT 288  --   --  269   < > = values in this interval not displayed.    Basic Metabolic Panel:  Recent Labs  Lab 10/02/19 0826 10/02/19 0826 10/02/19 0831 10/02/19 0831 10/02/19 1332 10/02/19 2038 10/03/19 0213 10/03/19 0802  NA 141   < > 144   < > 145   < > 145 148*  K 3.4*   < > 3.2*   < > 3.0*  --  3.6  --   CL 104   < > 108  --   --   --  117*  --   CO2 22  --   --   --   --   --  19*  --   GLUCOSE 116*   < > 110*  --   --   --  145*  --   BUN 21   < > 28*  --   --   --  12  --   CREATININE 1.60*   < > 1.50*  --   --   --  1.37*  --   CALCIUM 9.4  --   --   --   --   --  8.3*  --    < > = values in this interval not displayed.   Lipid Panel:     Component Value Date/Time   CHOL 176 10/03/2019 0216   CHOL 231 (H) 02/18/2018 1500   TRIG 471 (H) 10/03/2019 0216   HDL 30 (L) 10/03/2019 0216   HDL 41 02/18/2018 1500   CHOLHDL 5.9 10/03/2019 0216   VLDL UNABLE TO CALCULATE IF  TRIGLYCERIDE OVER 400 mg/dL 10/03/2019 0216   LDLCALC UNABLE TO CALCULATE IF TRIGLYCERIDE OVER 400 mg/dL 10/03/2019 0216   LDLCALC 153 (H) 02/18/2018 1500   HgbA1c: No results found for: HGBA1C Urine Drug Screen:     Component Value Date/Time   LABOPIA NONE DETECTED 10/02/2019 1309   COCAINSCRNUR POSITIVE (A) 10/02/2019 1309   LABBENZ NONE DETECTED 10/02/2019 1309   AMPHETMU NONE DETECTED 10/02/2019 1309   THCU POSITIVE (A) 10/02/2019 1309   LABBARB NONE DETECTED 10/02/2019 1309    Alcohol Level No results found  for: ETH  IMAGING past 24 hours CT HEAD WO CONTRAST  Result Date: 10/03/2019 CLINICAL DATA:  Stroke follow-up EXAM: CT HEAD WITHOUT CONTRAST TECHNIQUE: Contiguous axial images were obtained from the base of the skull through the vertex without intravenous contrast. COMPARISON:  Yesterday FINDINGS: Brain: Cytotoxic edema throughout the left MCA territory. Unchanged left more than right basal ganglia contrast staining. Decreased subarachnoid space contrast in the left occipital region and dependent posterior fossa. No herniation or entrapment. There is severe chronic small vessel ischemia and remote small vessel infarcts. Generalized atrophy. Vascular: Distal left ICA to MCA and ACA embolization. Skull: No acute finding Sinuses/Orbits: Generalized sinusitis. IMPRESSION: 1. Diminishing subarachnoid contrast. 2. Left MCA territory infarct without significant mass effect. Electronically Signed   By: Monte Fantasia M.D.   On: 10/03/2019 08:27   CT HEAD WO CONTRAST  Result Date: 10/02/2019 CLINICAL DATA:  Encephalopathy EXAM: CT HEAD WITHOUT CONTRAST TECHNIQUE: Contiguous axial images were obtained from the base of the skull through the vertex without intravenous contrast. COMPARISON:  None. FINDINGS: Brain: Stroke there is a large amount of contrast staining within the left basal ganglia and in the subarachnoid space adjacent to the left occipital lobe. There is a small amount of staining  in the right basal ganglia. Extra-axial contrast along the tentorium cerebelli. There is a large amount of cytotoxic edema within the left MCA territory. There is periventricular hypoattenuation compatible with chronic microvascular disease. 3 mm of leftward midline shift. Vascular: Coiling of the left ICA terminus. Skull: Normal. Negative for fracture or focal lesion. Sinuses/Orbits: No acute finding. Other: None. IMPRESSION: 1. Large amount of hemorrhage/contrast staining within the left basal ganglia and in the posterior subdural and subarachnoid spaces. 2. Large amount of cytotoxic edema within the left MCA territory with 3 mm of leftward midline shift. Electronically Signed   By: Ulyses Jarred M.D.   On: 10/02/2019 20:11   DG Chest Port 1 View  Result Date: 10/02/2019 CLINICAL DATA:  Endotracheal 2 placement. EXAM: PORTABLE CHEST 1 VIEW COMPARISON:  None. FINDINGS: Endotracheal tube tip projects at the carina near the origin of the right mainstem bronchus. Recommend retracting 1-2 cm. Nasal/orogastric tube passes below the diaphragm, well within the stomach. Cardiac silhouette is normal in size. No mediastinal or hilar masses. There is opacity at the left lung base accentuated by low lung volumes, which may reflect atelectasis or infection. Remainder of the lungs is clear. No convincing pleural effusion and no pneumothorax. Skeletal structures are grossly intact. IMPRESSION: 1. Endotracheal tube tip projects at the carina, at the origin of the right mainstem bronchus. Recommend retracting 1-2 cm for more optimal positioning. Critical Value/emergent results were called by telephone at the time of interpretation on 10/02/2019 at 2:48 pm to Sarah, the patient's nurse, who verbally acknowledged these results. 2. Well-positioned nasal/orogastric tube. 3. Left lung base opacity which may reflect atelectasis or pneumonia. Electronically Signed   By: Lajean Manes M.D.   On: 10/02/2019 14:49   DG Abd Portable  1V  Result Date: 10/02/2019 CLINICAL DATA:  Orogastric tube placement EXAM: PORTABLE ABDOMEN - 1 VIEW COMPARISON:  Chest x-ray same date FINDINGS: Scattered gas-filled loops of bowel throughout the abdomen. Gastric tube coiled in the stomach, tip directed towards the gastric fundus. Spinal degenerative changes. No acute bone finding. Limited assessment excludes the pelvis. IMPRESSION: 1. Gastric tube coiled in the stomach, tip directed towards the gastric fundus. 2. Scattered gas-filled loops of bowel throughout the abdomen. Correlate with any signs of  ileus. Electronically Signed   By: Zetta Bills M.D.   On: 10/02/2019 14:41   [place] EKG SITE RITE  Result Date: 10/03/2019 If Site Rite image not attached, placement could not be confirmed due to current cardiac rhythm.   PHYSICAL EXAM  Temp:  [99 F (37.2 C)-100.3 F (37.9 C)] 99 F (37.2 C) (05/17 0800) Pulse Rate:  [68-105] 73 (05/17 1000) Resp:  [12-28] 18 (05/17 1000) BP: (107-165)/(66-105) 129/77 (05/17 0900) SpO2:  [87 %-100 %] 98 % (05/17 1000) Arterial Line BP: (117-205)/(58-98) 162/67 (05/17 1000) FiO2 (%):  [40 %-100 %] 50 % (05/17 0833) Weight:  [77.7 kg] 77.7 kg (05/16 1315)  General - Well nourished, well developed, intubated on sedation.  Ophthalmologic - fundi not visualized due to noncooperation.  Cardiovascular - Regular rate and rhythm.  Neuro - intubated on sedation, eyes closed, not following commands. With forced eye opening, eyes in mid position, not blinking to visual threat, doll's eyes present, not tracking, PERRL. Corneal reflex weakly present bilaterally, gag and cough present. Breathing over the vent.  Facial symmetry not able to test due to ET tube.  Tongue protrusion not cooperative. Spontaneous and purposeful movement of LUE and LLE, but not on the right. On pain stimulation, no movement of RUE and RLE. DTR 1+ and no babinski. Sensation, coordination and gait not tested.   ASSESSMENT/PLAN [name]  is a 64 y.o. male with history of HTN who fell the night prior to admission, presenting with right sided weakness and aphasia. Taken to IR for terminus R ICA occlusion.  Stroke:   L MCA infarct due to left M1 occlusion s/p L MCA thrombectomy, infarct likely secondary to L ICA large vessel disease  ICH and SAH: due to left tICA extravasation s/p L ICA coil embolization  Code Stroke CT head cytotoxic edema L insula and frontal operculum. Likely old L parietal cortical and subcortical abnormality. ASPECTS 8    CTA head & neck L ICA occlusion. Widespread atherosclerosis: B CCA to ICA bifurcation R 40%, L 50% w/ extensive soft plaques at bifurcation and bulbs, L 30-40%. Severe B ICA siphon stenoses. R MCA and B PCA atherosclerosis w/ narrowing & irregularity.  CT perfusion 105 penumbra L MCA territory.   Cerebral angio diffuse and severe intracranial atherosclerosis. Proximal L M1 occlusion. Complete recanalization L M1 w/ embotrap w/ poor distal flow d/t severe L ICA stenosis s/p angioplasty. L ICA terminus coil embolization w/ sparing L anterior choroidal.  CT head repeat 5/16 large L basal ganglia hemorrhage/contrast posterior SDH and SAH. Large cytotoxic edema L MCA w/ 73mm midline shift.   CT head repeat 5/17 diminishing SAH contrast. L MCA infarct w/o significant mass effect  MRI  Pending, non urgent  2D Echo pending  TG 471 and direct LDL 95.3, goal < 70  HgbA1c pending   SCDs for VTE prophylaxis  No antithrombotic prior to admission, now on No antithrombotic given hemorrhage   Therapy recommendations:  pending   Disposition:  pending   Cytotoxic Cerebral Edema  Present on admission scan  Repeat CT head 5/17 large left MCA infarct with edema but no significant midline shift  On 3% protocol  3% at 75h  Goal Na 150-155  Na [id number]  May consider left hemicrani if cerebral edema worsening and pt family would like aggressive care  Acute hypoxic respiratory failure  with compromised airway Atelectasis   Intubated for IR, continued d/t compromised airway  CCM on board  On vent and sedation  Hypertensive Emergency  SBP > 220 on arrival  Home meds:  Amlodipine 10, coreg 3.125 bid, HCTZ 25, lisinopril 40  On cleviprex and labetalol IV  On amlodipine 10, lisinopril 20, hydralazine 100 q6  BP goal < 140 given ICH, SAH and SDH  . Prn hydralazine  . Long-term BP goal normotensive  Hyperlipidemia  Home meds:  lipitor 40, resumed in hospital  TG 471 and TC 176, direct LDL 95.3, goal < 70  On lipitor 40  Continue statin at discharge  Hyperglycemia  HgbA1c pending, goal < 7.0  CBGs  SSI  Dysphagia . Secondary to stroke . NPO . On tube feedings @ 40 . Speech on board  Cocaine abuse  UDS positive for cocaine  Cessation education will be provided   Other Stroke Risk Factors  Advanced age  Former Cigarette smoker, quit 5 yrs ago  ETOH use, advised to drink no more than 2 drink(s) a day  THC abuse - UDS:  THC POSITIVE, cessation education will be provided.  Family hx stroke (mother)  Coronary artery disease, MI  Other Active Problems  CKD stage II Cre 1.37  TG elevated at 476->471  Hospital day # 1  This patient is critically ill due to left MCA occlusion s/p EVT, ICH, SAH and SDH, cerebral edema, respiratory failure, cocaine abuse and at significant risk of neurological worsening, death form recurrent stroke, hematoma expansion, heart failure, seizure, respiratory failure, brain herniation. This patient's care requires constant monitoring of vital signs, hemodynamics, respiratory and cardiac monitoring, review of multiple databases, neurological assessment, discussion with family, other specialists and medical decision making of high complexity. I spent 40 minutes of neurocritical care time in the care of this patient. I had long discussion with son at bedside, updated pt current condition, treatment plan and potential  prognosis, and answered all the questions. He expressed understanding and appreciation. He will relay info and discuss with his sisters.   Rosalin Hawking, MD PhD Stroke Neurology 10/03/2019 11:01 AM  To contact Stroke Continuity provider, please refer to [email or web address] After hours, contact General Neurology

## 2019-10-03 NOTE — Progress Notes (Signed)
The chaplain responded to a request for prayer. The family was in the room and request the chaplain return for prayer when the family is fully present. The chaplain will follow-up as family arrives.  Brion Aliment Chaplain Resident For questions concerning this note please contact me by pager [phone]

## 2019-10-03 NOTE — Progress Notes (Addendum)
NAME:  Riley Wagner, MRN:  [id number], DOB:  1956/01/09, LOS: 1 ADMISSION DATE:  10/02/2019, CONSULTATION DATE:  10/02/2019 REFERRING MD:  Dr. Debbrah Alar, neuro IR, CHIEF COMPLAINT:  Altered mental status   Brief History   64 yo male former smoker brought to ER with hypertensive crisis (BP 212/110), Rt sided weakness and aphasia.  Found to have Lt terminal ICA occlusion.  Intubated for airway protection.  Noted to have intraparenchymal bleed along posterior limb of IC as well as Rt sided bleed.  Had embolization of Lt ICA terminus.  Past Medical History  HTN, [place] [facility] Events   5/16 Admit. To IR for embolization of Left ICA. Back to ICU intubated. BP goal 120-140 5/17: still hypertensive in spite of maximum dose Cleviprex.  Continues to exhibit right-sided hemiparesis  Consults:  Neuro IR  Procedures:  ETT 5/16 >> Radial aline 5/16 >>  Significant Diagnostic Tests:   CT head 5/16 >> acute cytotoxic edema in Lt insula and frontal operculum, extensive chronic small vessel ischemic changes  Echo 5/17 >>   Micro Data:  SARS CoV2 PCR 5/16 >> negative  Antimicrobials:    Interim history/subjective:  No issues over night   Objective   Blood pressure 125/71, pulse 87, temperature 100 F (37.8 C), temperature source Axillary, resp. rate 20, height 5\' 7"  (1.702 m), weight 77.7 kg, SpO2 99 %.    Vent Mode: PRVC FiO2 (%):  [40 %-100 %] 60 % Set Rate:  [18 bmp] 18 bmp Vt Set:  [530 mL] 530 mL PEEP:  [5 cmH20-10 cmH20] 10 cmH20 Plateau Pressure:  [20 cmH20-21 cmH20] 21 cmH20   Intake/Output Summary (Last 24 hours) at 10/03/2019 [id number] Last data filed at 10/03/2019 0600 Gross per 24 hour  Intake 3828.99 ml  Output 1795 ml  Net 2033.99 ml   Filed Weights   10/02/19 1315  Weight: 77.7 kg    Examination:  General 64 year old black male currently sedated on propofol infusion HEENT normocephalic atraumatic pupils pinpoint orally intubated no JVD mucous membranes  moist Pulmonary: Clear to auscultation bilaterally no accessory use currently on PEEP 5, FiO2 40%.  Cardiac: Regular rate and rhythm without murmur rub or gallop Abdomen soft nontender no organomegaly positive bowel sounds GU clear yellow Neuro: Some withdrawal on the left, right-sided hemiparetic   Resolved Hospital Problem list     Assessment & Plan:   Acute hypoxic respiratory failure with compromised airway. Atelectasis  pcxr Of the carinafrom 5/16: This demonstrated endotracheal tube at level, there was left basilar atelectasis this was retracted Plan Continue full ventilator support PAD protocol: RASS goal -2 for now, changed to 0 to -1 after blood pressure better controlled VAP bundle A.m. chest x-ray Will prob need to stop diprivan 5/18 if trg still keep rising  Hypertensive emergency. Hx of HLD. Plan Goal systolic blood pressure 0000000 Serial neuro checks Continue Norvasc and Coreg via tube, adding scheduled hydralazine via tube Weaning Cleviprex  follow-up echocardiogram   Lt ICA occlusion with intraparenchymal bleed along posterior limb of IC as well as Rt sided bleed. -Follow-up CT brain Showing diminishing subarachnoid contrast.  Generalized sinusitis, left MCA territory infarct without significant mass-effect Plan Continue serial neuro checks Follow-up MRI Additional stroke diagnostics per stroke service Hypertonic saline protocol per stroke service  CKD2. Serum creatinine stable  plan Continue to monitor  Mild hyperchloremic non-anion anion gap metabolic acidosis Plan Continue to trend serial chemistries Hypertonic saline driving some of this  Hyperglycemia Plan Sensitive scale insulin  Best practice:  Diet: NPO-->start tubefeeds /17 DVT prophylaxis: SCDs GI prophylaxis: protonix Mobility: bed rest Code Status: full code Disposition: ICU   My critical care x35 minutes  Erick Colace ACNP-BC [place] Pager #  [phone] OR # [phone] if no answer

## 2019-10-03 NOTE — Progress Notes (Signed)
Peripherally Inserted Central Catheter Placement  The IV Nurse has discussed with the patient and/or persons authorized to consent for the patient, the purpose of this procedure and the potential benefits and risks involved with this procedure.  The benefits include less needle sticks, lab draws from the catheter, and the patient may be discharged home with the catheter. Risks include, but not limited to, infection, bleeding, blood clot (thrombus formation), and puncture of an artery; nerve damage and irregular heartbeat and possibility to perform a PICC exchange if needed/ordered by physician.  Alternatives to this procedure were also discussed.  Bard Power PICC patient education guide, fact sheet on infection prevention and patient information card has been provided to patient /or left at bedside.    PICC Placement Documentation  PICC Triple Lumen 123456 PICC Left Basilic 47 cm 0 cm (Active)  Indication for Insertion or Continuance of Line Vasoactive infusions;Limited venous access - need for IV therapy >5 days (PICC only) 10/03/19 1303  Exposed Catheter (cm) 0 cm 10/03/19 1303  Site Assessment Clean;Dry;Intact 10/03/19 1303  Lumen #1 Status Flushed;Saline locked;Blood return noted 10/03/19 1303  Lumen #2 Status Flushed;Saline locked;Blood return noted 10/03/19 1303  Lumen #3 Status Flushed;Saline locked;Blood return noted 10/03/19 1303  Dressing Type Transparent;Securing device 10/03/19 1303  Dressing Status Clean;Dry;Intact;Antimicrobial disc in place 10/03/19 1303  Safety Lock Placed;Not Applicable [id number]  Dressing Intervention New dressing 10/03/19 1303  Dressing Change Due 10/10/19 10/03/19 1303       Enos Fling 10/03/2019, 1:06 PM

## 2019-10-03 NOTE — Progress Notes (Signed)
Initial Nutrition Assessment  DOCUMENTATION CODES:   Not applicable  INTERVENTION:   Initiate tube feeding via OG tube: Vital High Protein at 25 ml/h (600 ml per day) Pro-stat 60 ml BID MVI daily  Provides 1000 kcal, 112 gm protein, 501 ml free water daily  TF regimen and propofol at current rate providing 4494 total kcal/day     NUTRITION DIAGNOSIS:   Inadequate oral intake related to inability to eat as evidenced by NPO status.  GOAL:   Patient will meet greater than or equal to 90% of their needs  MONITOR:   TF tolerance, Labs  REASON FOR ASSESSMENT:   Consult, Ventilator Enteral/tube feeding initiation and management  ASSESSMENT:   Pt with PMH of HTN, HLD, and former smoker now admitted with L MCA infarct due to hypertensive crisis. Pt s/p emergent thrombectomy of L MCA M1 occlusion, balloon angioplasty of R ICA stenosis followed by coil embolization of R ICA secondary to persistent bleeding.   Pt discussed during ICU rounds and with RN.  Pt maxed on cleviprex. Having PICC placed today for hypertonic saline therapy.   Patient is currently intubated on ventilator support MV: 12 L/min Temp (24hrs), Avg:99.6 F (37.6 C), Min:99 F (37.2 C), Max:100.3 F (37.9 C)  Propofol: 16 ml/hr provides: 422 Cleviprex: 64 ml/hr provides: 3072 kcal  Medications review ed and include: colace, miralax, 40 mEq KCl daily Hypertonic saline  Labs reviewed: PO4: 1.9 (L), TG: 471 (H) OG tube; tip gastric    NUTRITION - FOCUSED PHYSICAL EXAM:    Most Recent Value  Orbital Region  No depletion  Upper Arm Region  No depletion  Thoracic and Lumbar Region  No depletion  Buccal Region  No depletion  Temple Region  No depletion  Clavicle Bone Region  No depletion  Clavicle and Acromion Bone Region  No depletion  Scapular Bone Region  Unable to assess  Dorsal Hand  No depletion  Patellar Region  No depletion  Anterior Thigh Region  No depletion  Posterior Calf Region  No  depletion  Edema (RD Assessment)  None  Hair  Unable to assess  Eyes  Unable to assess  Mouth  Unable to assess  Skin  Reviewed  Nails  Reviewed       Diet Order:   Diet Order            Diet NPO time specified  Diet effective now              EDUCATION NEEDS:   No education needs have been identified at this time  Skin:  Skin Assessment: Reviewed RN Assessment  Last BM:  unknown  Height:   Ht Readings from Last 1 Encounters:  10/02/19 5\' 7"  (1.702 m)    Weight:   Wt Readings from Last 1 Encounters:  10/02/19 77.7 kg    Ideal Body Weight:  67.2 kg  BMI:  Body mass index is 26.83 kg/m.  Estimated Nutritional Needs:   Kcal:  1953  Protein:  100-120 grams  Fluid:  > 1.9 L/day  Heather P., RD, LDN, CNSC See AMiON for contact information

## 2019-10-03 NOTE — Progress Notes (Signed)
Patient transported to CT scan and back to 4N15 without incidence. 

## 2019-10-03 NOTE — Progress Notes (Signed)
CRITICAL VALUE ALERT  Critical Value:  Na 156  Date & Time Notied:  10/03/19 2030  Provider Notified: Cheral Marker  Orders Received/Actions taken: Hypertonic saline stopped- will collect next sodium at 0200.

## 2019-10-03 NOTE — Progress Notes (Signed)
SLP Cancellation Note  Patient Details Name: Riley Wagner MRN: [id number] DOB: 03-11-1956   Cancelled treatment:       Reason Eval/Treat Not Completed: Medical issues which prohibited therapy (remains on vent). Will f/u as able.    Osie Bond., M.A. [facility] Pager [phone] Office [phone]  10/03/2019, 7:58 AM

## 2019-10-03 NOTE — Progress Notes (Signed)
Attempted to perform echo.  Requested to come back later per nurse

## 2019-10-03 NOTE — TOC CAGE-AID Note (Signed)
Transition of Care [facility]) - CAGE-AID Screening   Patient Details  Name: Riley Wagner MRN: [id number] Date of Birth: 07/30/1955  [facility]) CM/SW Contact:    Emeterio Reeve, [place] Phone Number: 10/03/2019, 3:48 PM   Clinical Narrative: Pt was unable to participate in assessment due to not being fully oriented.   CAGE-AID Screening: Substance Abuse Screening unable to be completed due to: : Patient unable to participate                  [place] Clinical Social Worker [phone]

## 2019-10-03 NOTE — Progress Notes (Signed)
[id number]:  Neurology paged regarding not meeting blood parameter.  Currently max on cleviprex gtt and PRN given.  MD aware of difficulty in controlling blood pressure.  New orders received.  See MAR.

## 2019-10-03 NOTE — Progress Notes (Signed)
OT Cancellation Note  Patient Details Name: [name] MRN: [id number] DOB: Feb 21, 1956   Cancelled Treatment:    Reason Eval/Treat Not Completed: Patient not medically ready.  Will reattempt.  [name]., OTR/L Acute Rehabilitation Services Pager [phone] Office [phone]   [name] M 10/03/2019, 8:13 AM

## 2019-10-03 NOTE — Progress Notes (Signed)
PT Cancellation Note  Patient Details Name: Riley Wagner MRN: [id number] DOB: [birth year]   Cancelled Treatment:    Reason Eval/Treat Not Completed: Patient not medically ready. Pt remains on vent. Per RN, hold today. Acute PT to return as able, as appropriate.  Kittie Plater, PT, DPT Acute Rehabilitation Services Pager #: [phone] Office #: [phone]    Berline Lopes 10/03/2019, 8:15 AM

## 2019-10-04 ENCOUNTER — Inpatient Hospital Stay (HOSPITAL_COMMUNITY): Payer: Medicare Other

## 2019-10-04 DIAGNOSIS — J9621 Acute and chronic respiratory failure with hypoxia: Secondary | ICD-10-CM

## 2019-10-04 DIAGNOSIS — I63413 Cerebral infarction due to embolism of bilateral middle cerebral arteries: Principal | ICD-10-CM

## 2019-10-04 DIAGNOSIS — I169 Hypertensive crisis, unspecified: Secondary | ICD-10-CM

## 2019-10-04 LAB — TRIGLYCERIDES: Triglycerides: 106 mg/dL (ref <150)

## 2019-10-04 LAB — SODIUM
Sodium: 169 mmol/L (ref 135–145)
Sodium: 157 mmol/L — ABNORMAL HIGH (ref 135–145)
Sodium: 154 mmol/L — ABNORMAL HIGH (ref 135–145)

## 2019-10-04 LAB — CBC
HCT: 41.5 % (ref 39.0–52.0)
Hemoglobin: 13.5 g/dL (ref 13.0–17.0)
MCH: 31.5 pg (ref 26.0–34.0)
MCHC: 32.5 g/dL (ref 30.0–36.0)
MCV: 96.7 fL (ref 80.0–100.0)
Platelets: 216 10*3/uL (ref 150–400)
RBC: 4.29 MIL/uL (ref 4.22–5.81)
RDW: 15.8 % — ABNORMAL HIGH (ref 11.5–15.5)
WBC: 14.3 10*3/uL — ABNORMAL HIGH (ref 4.0–10.5)
nRBC: 0 % (ref 0.0–0.2)

## 2019-10-04 LAB — GLUCOSE, CAPILLARY
Glucose-Capillary: 133 mg/dL — ABNORMAL HIGH (ref 70–99)
Glucose-Capillary: 138 mg/dL — ABNORMAL HIGH (ref 70–99)

## 2019-10-04 LAB — BASIC METABOLIC PANEL
Anion gap: 10 (ref 5–15)
BUN: 15 mg/dL (ref 8–23)
CO2: 20 mmol/L — ABNORMAL LOW (ref 22–32)
Calcium: 8.8 mg/dL — ABNORMAL LOW (ref 8.9–10.3)
Chloride: 125 mmol/L — ABNORMAL HIGH (ref 98–111)
Creatinine, Ser: 1.27 mg/dL — ABNORMAL HIGH (ref 0.61–1.24)
GFR calc Af Amer: >60 mL/min (ref >60)
GFR calc non Af Amer: 59 mL/min — ABNORMAL LOW (ref >60)
Glucose, Bld: 164 mg/dL — ABNORMAL HIGH (ref 70–99)
Potassium: 3.6 mmol/L (ref 3.5–5.1)
Sodium: 155 mmol/L — ABNORMAL HIGH (ref 135–145)

## 2019-10-04 LAB — HEMOGLOBIN A1C
Hgb A1c MFr Bld: 5.9 % — ABNORMAL HIGH (ref 4.8–5.6)
Mean Plasma Glucose: 123 mg/dL

## 2019-10-04 LAB — MAGNESIUM
Magnesium: 2.4 mg/dL (ref 1.7–2.4)
Magnesium: 2.3 mg/dL (ref 1.7–2.4)

## 2019-10-04 LAB — PHOSPHORUS
Phosphorus: 1.6 mg/dL — ABNORMAL LOW (ref 2.5–4.6)
Phosphorus: 4.2 mg/dL (ref 2.5–4.6)

## 2019-10-04 MED ORDER — CLONIDINE HCL 0.1 MG PO TABS
0.1000 mg | ORAL_TABLET | Freq: Three times a day (TID) | ORAL | Status: DC
  Administered 2019-10-04 – 2019-10-05 (×4): 0.1 mg
  Filled 2019-10-04 (×3): qty 1

## 2019-10-04 MED ORDER — CLONIDINE HCL 0.1 MG PO TABS
0.1000 mg | ORAL_TABLET | Freq: Three times a day (TID) | ORAL | Status: DC
  Filled 2019-10-04: qty 1

## 2019-10-04 MED ORDER — LISINOPRIL 20 MG PO TABS
20.0000 mg | ORAL_TABLET | Freq: Two times a day (BID) | ORAL | Status: DC
  Administered 2019-10-04: 20 mg
  Filled 2019-10-04: qty 1

## 2019-10-04 MED ORDER — SODIUM CHLORIDE 3 % IN NEBU
4.0000 mL | INHALATION_SOLUTION | Freq: Every day | RESPIRATORY_TRACT | Status: AC
  Administered 2019-10-04 – 2019-10-06 (×3): 4 mL via RESPIRATORY_TRACT
  Filled 2019-10-04 (×3): qty 4

## 2019-10-04 MED ORDER — POTASSIUM PHOSPHATES 15 MMOLE/5ML IV SOLN
30.0000 mmol | Freq: Once | INTRAVENOUS | Status: AC
  Administered 2019-10-04: 30 mmol via INTRAVENOUS
  Filled 2019-10-04: qty 10

## 2019-10-04 MED ORDER — LABETALOL HCL 100 MG PO TABS
300.0000 mg | ORAL_TABLET | Freq: Three times a day (TID) | ORAL | Status: DC
  Administered 2019-10-04 – 2019-10-05 (×3): 300 mg
  Filled 2019-10-04 (×4): qty 3

## 2019-10-04 NOTE — Progress Notes (Signed)
Referring Physician(s): Code Stroke- Greta Doom  Supervising Physician: Pedro Earls  Patient Status:  Riley Wagner - In-pt  Chief Complaint: None- intubated with sedation  Subjective:  History of acute CVA s/p cerebral arteriogram with emergent mechanical thrombectomy of proximal left MCA M1 occlusion, along with balloon angioplasty of right ICA/MCA stenosis, followed by coil embolization of right ICA terminus secondary to persistent bleeding (seen on first angiogram prior to thrombectomy) 10/02/2019 by Dr. Karenann Cai. Patient laying in bed intubated with sedation. He does not respond to voice and does not follow simple commands. Left side withdraws from pain, no movements of right side. Right groin incision c/d/i.   Allergies: Patient has no known allergies.  Medications: Prior to Admission medications   Medication Sig Start Date End Date Taking? Authorizing Provider  lisinopril (ZESTRIL) 40 MG tablet Take 1 tablet (40 mg total) by mouth daily. To help lower blood pressure Patient taking differently: Take 40 mg by mouth daily.  03/23/19  Yes Fulp, Cammie, MD  acetaminophen (TYLENOL) 500 MG tablet Take 2 tablets (1,000 mg total) by mouth every 6 (six) hours as needed. Patient not taking: Reported on 10/02/2019 02/13/17   Alfonse Spruce, FNP  amLODipine (NORVASC) 10 MG tablet Take one pill one time daily to help lower blood pressure Patient not taking: Reported on 10/02/2019 03/23/19   Antony Blackbird, MD  aspirin EC 81 MG tablet Take 1 tablet (81 mg total) by mouth daily. Patient not taking: Reported on 10/02/2019 02/13/17   Alfonse Spruce, FNP  atorvastatin (LIPITOR) 40 MG tablet Take 1 tablet (40 mg total) by mouth daily. To help lower cholesterol Patient not taking: Reported on 10/02/2019 03/23/19   Fulp, Cammie, MD  carvedilol (COREG) 3.125 MG tablet TAKE ONE TABLET BY MOUTH TWICE DAY to help lower blood pressure Patient not taking: Reported on  10/02/2019 03/23/19   Fulp, Cammie, MD  cyclobenzaprine (FLEXERIL) 5 MG tablet Take 1 tablet (5 mg total) by mouth every 8 (eight) hours as needed for muscle spasms. Patient not taking: Reported on 10/02/2019 02/13/17   Alfonse Spruce, FNP  hydrochlorothiazide (HYDRODIURIL) 25 MG tablet Take 1 tablet (25 mg total) by mouth daily. To help lower blood pressure Patient not taking: Reported on 10/02/2019 03/23/19   Antony Blackbird, MD     Vital Signs: BP 129/76   Pulse 74   Temp 99.2 F (37.3 C) (Axillary)   Resp (!) 22   Ht 5\' 7"  (1.702 m)   Wt 170 lb 10.2 oz (77.4 kg)   SpO2 98%   BMI 26.73 kg/m   Physical Exam Vitals and nursing note reviewed.  Constitutional:      General: He is not in acute distress.    Comments: Intubated with sedation.  Pulmonary:     Effort: Pulmonary effort is normal. No respiratory distress.     Comments: Intubated with sedation. Skin:    General: Skin is warm and dry.     Comments: Right groin incision soft without active bleeding or hematoma.  Neurological:     Comments: Intubated with sedation. He does not respond to voice and does not follow simple commands. PERRL bilaterally. Left side withdraws from pain, no movements of right side. Distal pulses (PTs) palpable bilaterally with Doppler.      Imaging: CT Code Stroke CTA Head W/WO contrast  Result Date: 10/02/2019 CLINICAL DATA:  Acute presentation with speech disturbance EXAM: CT ANGIOGRAPHY HEAD AND NECK CT PERFUSION BRAIN TECHNIQUE: Multidetector  CT imaging of the head and neck was performed using the standard protocol during bolus administration of intravenous contrast. Multiplanar CT image reconstructions and MIPs were obtained to evaluate the vascular anatomy. Carotid stenosis measurements (when applicable) are obtained utilizing NASCET criteria, using the distal internal carotid diameter as the denominator. Multiphase CT imaging of the brain was performed following IV bolus contrast injection.  Subsequent parametric perfusion maps were calculated using RAPID software. CONTRAST:  146mL OMNIPAQUE IOHEXOL 350 MG/ML SOLN COMPARISON:  Head CT earlier same day FINDINGS: FINDINGS CTA NECK FINDINGS Aortic arch: Aortic atherosclerosis. Branching pattern is normal without origin stenosis. Right carotid system: Common carotid artery shows atherosclerotic plaque with stenosis up to 40% along its course to the bifurcation. Carotid bifurcation shows soft and calcified plaque. No stenosis of the ICA compared to the more distal cervical ICA territory. Left carotid system: Common carotid artery shows atherosclerotic plaque with narrowing along its course up to 50%. Soft and calcified plaque at the carotid bifurcation and ICA bulb. Minimal diameter in the ICA bulb is 2 mm. Compared to a more distal cervical ICA diameter of 3 mm, this indicates a 30-40% reduction, though the vessel size is likely reduced due to decreased outflow. Vertebral arteries: Right vertebral artery stenosis of 70% or greater. Beyond that, the vessel is patent through the cervical region to the foramen magnum. Severe stenosis of the left vertebral artery origin common nearly occluded, but with ongoing flow/patency to the foramen magnum. Skeleton: Ordinary cervical spondylosis. Other neck: No mass or lymphadenopathy. Upper chest: Negative Review of the MIP images confirms the above findings CTA HEAD FINDINGS Anterior circulation: Right internal carotid artery is patent through the skull base and siphon regions. Advanced atherosclerotic calcification in the carotid siphon region with serial stenoses on the order of 70%. Supraclinoid ICA is patent. Right anterior and middle cerebral vessels are patent. Widespread atherosclerotic narrowing and irregularity. Left internal carotid artery this severely disease at the skull base and siphon region with severe atherosclerotic narrowing and irregularity. Occlusion at the carotid terminus affecting the complete  left MCA distribution. Small amount of collateral flow. Patient has an azygos anterior cerebral artery with apparent intact supply to the distal ACA regions. Posterior circulation: Both vertebral arteries are patent at the foramen magnum level. 70% stenosis of the distal left V4 segment. Both vertebral arteries do reach the basilar. No focal basilar stenosis. Superior cerebellar and posterior cerebral branch vessels are patent. Venous sinuses: Patent and normal. Anatomic variants: None other significant. Review of the MIP images confirms the above findings CT Brain Perfusion Findings: ASPECTS: 8 or possibly 7 CBF (<30%) Volume: 45mL Perfusion (Tmax>6.0s) volume: 129mL Mismatch Volume: 145mL Infarction Location:Left insula and frontal operculum and underlying white matter Hypoperfusion index of 0.5 suggests poor collateral flow. IMPRESSION: Occlusion of the left carotid terminus. 31 cc region infarct core in the left insula and operculum. 105 cc of penumbra at risk brain throughout the remainder of the left MCA territory. Widespread atherosclerotic disease. Stenoses of both common carotid arteries along their course to the bifurcations, 40% on the right and 50% on the left. Extensive soft and calcified plaque at both carotid bifurcations and ICA bulbs. No measurable stenosis on the right. 30-40% stenosis on the left, possibly under measured because of decreased size of the more distal internal carotid artery. Severe atherosclerotic disease in both carotid siphon regions with severe Earl severe stenoses. Atherosclerotic narrowing and irregularity throughout the right MCA vessels and both PCA vessels. Azygos anterior cerebral anatomy results  in protection of the anterior cerebral territories. These results were communicated to Dr. Leonel Ramsay at 9:01 amon 5/16/2021by text page via the [facility] messaging system. Electronically Signed   By: Nelson Chimes M.D.   On: 10/02/2019 09:04   CT HEAD WO CONTRAST  Result Date:  10/04/2019 CLINICAL DATA:  Stroke follow-up EXAM: CT HEAD WITHOUT CONTRAST TECHNIQUE: Contiguous axial images were obtained from the base of the skull through the vertex without intravenous contrast. COMPARISON:  Yesterday FINDINGS: Brain: Confluent left MCA territory infarct with stable staining at the left basal ganglia. Right basal ganglia staining is resolved and subarachnoid contrast continues to diminish, mainly seen about the left occipital and parietal parasagittal sulci. A small cortically based infarct has developed along the right parietal cortex. Extensive chronic small vessel ischemia with confluent white matter low-density and remote lacunar infarct at the right thalamus. Mild progression of swelling without entrapment or herniation. Midline shift measures 6 mm today. Vascular: Embolized distal left ICA extending into the MCA. Skull: No acute finding. Sinuses/Orbits: Nasopharyngeal and sinus opacification in the setting of intubation. IMPRESSION: 1. A small acute infarct has become apparent in the right parietal cortex. 2. Diminishing extravasated contrast.  No interval hemorrhage. 3. Left MCA infarct with progressive cytotoxic edema/swelling and 6 mm midline shift. Electronically Signed   By: Monte Fantasia M.D.   On: 10/04/2019 05:55   CT HEAD WO CONTRAST  Result Date: 10/03/2019 CLINICAL DATA:  Stroke follow-up EXAM: CT HEAD WITHOUT CONTRAST TECHNIQUE: Contiguous axial images were obtained from the base of the skull through the vertex without intravenous contrast. COMPARISON:  Yesterday FINDINGS: Brain: Cytotoxic edema throughout the left MCA territory. Unchanged left more than right basal ganglia contrast staining. Decreased subarachnoid space contrast in the left occipital region and dependent posterior fossa. No herniation or entrapment. There is severe chronic small vessel ischemia and remote small vessel infarcts. Generalized atrophy. Vascular: Distal left ICA to MCA and ACA embolization.  Skull: No acute finding Sinuses/Orbits: Generalized sinusitis. IMPRESSION: 1. Diminishing subarachnoid contrast. 2. Left MCA territory infarct without significant mass effect. Electronically Signed   By: Monte Fantasia M.D.   On: 10/03/2019 08:27   CT HEAD WO CONTRAST  Result Date: 10/02/2019 CLINICAL DATA:  Encephalopathy EXAM: CT HEAD WITHOUT CONTRAST TECHNIQUE: Contiguous axial images were obtained from the base of the skull through the vertex without intravenous contrast. COMPARISON:  None. FINDINGS: Brain: Stroke there is a large amount of contrast staining within the left basal ganglia and in the subarachnoid space adjacent to the left occipital lobe. There is a small amount of staining in the right basal ganglia. Extra-axial contrast along the tentorium cerebelli. There is a large amount of cytotoxic edema within the left MCA territory. There is periventricular hypoattenuation compatible with chronic microvascular disease. 3 mm of leftward midline shift. Vascular: Coiling of the left ICA terminus. Skull: Normal. Negative for fracture or focal lesion. Sinuses/Orbits: No acute finding. Other: None. IMPRESSION: 1. Large amount of hemorrhage/contrast staining within the left basal ganglia and in the posterior subdural and subarachnoid spaces. 2. Large amount of cytotoxic edema within the left MCA territory with 3 mm of leftward midline shift. Electronically Signed   By: Ulyses Jarred M.D.   On: 10/02/2019 20:11   CT Code Stroke CTA Neck W/WO contrast  Result Date: 10/02/2019 CLINICAL DATA:  Acute presentation with speech disturbance EXAM: CT ANGIOGRAPHY HEAD AND NECK CT PERFUSION BRAIN TECHNIQUE: Multidetector CT imaging of the head and neck was performed using the standard protocol  during bolus administration of intravenous contrast. Multiplanar CT image reconstructions and MIPs were obtained to evaluate the vascular anatomy. Carotid stenosis measurements (when applicable) are obtained utilizing NASCET  criteria, using the distal internal carotid diameter as the denominator. Multiphase CT imaging of the brain was performed following IV bolus contrast injection. Subsequent parametric perfusion maps were calculated using RAPID software. CONTRAST:  132mL OMNIPAQUE IOHEXOL 350 MG/ML SOLN COMPARISON:  Head CT earlier same day FINDINGS: FINDINGS CTA NECK FINDINGS Aortic arch: Aortic atherosclerosis. Branching pattern is normal without origin stenosis. Right carotid system: Common carotid artery shows atherosclerotic plaque with stenosis up to 40% along its course to the bifurcation. Carotid bifurcation shows soft and calcified plaque. No stenosis of the ICA compared to the more distal cervical ICA territory. Left carotid system: Common carotid artery shows atherosclerotic plaque with narrowing along its course up to 50%. Soft and calcified plaque at the carotid bifurcation and ICA bulb. Minimal diameter in the ICA bulb is 2 mm. Compared to a more distal cervical ICA diameter of 3 mm, this indicates a 30-40% reduction, though the vessel size is likely reduced due to decreased outflow. Vertebral arteries: Right vertebral artery stenosis of 70% or greater. Beyond that, the vessel is patent through the cervical region to the foramen magnum. Severe stenosis of the left vertebral artery origin common nearly occluded, but with ongoing flow/patency to the foramen magnum. Skeleton: Ordinary cervical spondylosis. Other neck: No mass or lymphadenopathy. Upper chest: Negative Review of the MIP images confirms the above findings CTA HEAD FINDINGS Anterior circulation: Right internal carotid artery is patent through the skull base and siphon regions. Advanced atherosclerotic calcification in the carotid siphon region with serial stenoses on the order of 70%. Supraclinoid ICA is patent. Right anterior and middle cerebral vessels are patent. Widespread atherosclerotic narrowing and irregularity. Left internal carotid artery this  severely disease at the skull base and siphon region with severe atherosclerotic narrowing and irregularity. Occlusion at the carotid terminus affecting the complete left MCA distribution. Small amount of collateral flow. Patient has an azygos anterior cerebral artery with apparent intact supply to the distal ACA regions. Posterior circulation: Both vertebral arteries are patent at the foramen magnum level. 70% stenosis of the distal left V4 segment. Both vertebral arteries do reach the basilar. No focal basilar stenosis. Superior cerebellar and posterior cerebral branch vessels are patent. Venous sinuses: Patent and normal. Anatomic variants: None other significant. Review of the MIP images confirms the above findings CT Brain Perfusion Findings: ASPECTS: 8 or possibly 7 CBF (<30%) Volume: 31mL Perfusion (Tmax>6.0s) volume: 146mL Mismatch Volume: 141mL Infarction Location:Left insula and frontal operculum and underlying white matter Hypoperfusion index of 0.5 suggests poor collateral flow. IMPRESSION: Occlusion of the left carotid terminus. 31 cc region infarct core in the left insula and operculum. 105 cc of penumbra at risk brain throughout the remainder of the left MCA territory. Widespread atherosclerotic disease. Stenoses of both common carotid arteries along their course to the bifurcations, 40% on the right and 50% on the left. Extensive soft and calcified plaque at both carotid bifurcations and ICA bulbs. No measurable stenosis on the right. 30-40% stenosis on the left, possibly under measured because of decreased size of the more distal internal carotid artery. Severe atherosclerotic disease in both carotid siphon regions with severe Earl severe stenoses. Atherosclerotic narrowing and irregularity throughout the right MCA vessels and both PCA vessels. Azygos anterior cerebral anatomy results in protection of the anterior cerebral territories. These results were communicated to Dr.  Kirkpatrick at 9:01 amon  5/16/2021by text page via the [facility] messaging system. Electronically Signed   By: Nelson Chimes M.D.   On: 10/02/2019 09:04   CT Code Stroke Cerebral Perfusion with contrast  Result Date: 10/02/2019 CLINICAL DATA:  Acute presentation with speech disturbance EXAM: CT ANGIOGRAPHY HEAD AND NECK CT PERFUSION BRAIN TECHNIQUE: Multidetector CT imaging of the head and neck was performed using the standard protocol during bolus administration of intravenous contrast. Multiplanar CT image reconstructions and MIPs were obtained to evaluate the vascular anatomy. Carotid stenosis measurements (when applicable) are obtained utilizing NASCET criteria, using the distal internal carotid diameter as the denominator. Multiphase CT imaging of the brain was performed following IV bolus contrast injection. Subsequent parametric perfusion maps were calculated using RAPID software. CONTRAST:  156mL OMNIPAQUE IOHEXOL 350 MG/ML SOLN COMPARISON:  Head CT earlier same day FINDINGS: FINDINGS CTA NECK FINDINGS Aortic arch: Aortic atherosclerosis. Branching pattern is normal without origin stenosis. Right carotid system: Common carotid artery shows atherosclerotic plaque with stenosis up to 40% along its course to the bifurcation. Carotid bifurcation shows soft and calcified plaque. No stenosis of the ICA compared to the more distal cervical ICA territory. Left carotid system: Common carotid artery shows atherosclerotic plaque with narrowing along its course up to 50%. Soft and calcified plaque at the carotid bifurcation and ICA bulb. Minimal diameter in the ICA bulb is 2 mm. Compared to a more distal cervical ICA diameter of 3 mm, this indicates a 30-40% reduction, though the vessel size is likely reduced due to decreased outflow. Vertebral arteries: Right vertebral artery stenosis of 70% or greater. Beyond that, the vessel is patent through the cervical region to the foramen magnum. Severe stenosis of the left vertebral artery origin common  nearly occluded, but with ongoing flow/patency to the foramen magnum. Skeleton: Ordinary cervical spondylosis. Other neck: No mass or lymphadenopathy. Upper chest: Negative Review of the MIP images confirms the above findings CTA HEAD FINDINGS Anterior circulation: Right internal carotid artery is patent through the skull base and siphon regions. Advanced atherosclerotic calcification in the carotid siphon region with serial stenoses on the order of 70%. Supraclinoid ICA is patent. Right anterior and middle cerebral vessels are patent. Widespread atherosclerotic narrowing and irregularity. Left internal carotid artery this severely disease at the skull base and siphon region with severe atherosclerotic narrowing and irregularity. Occlusion at the carotid terminus affecting the complete left MCA distribution. Small amount of collateral flow. Patient has an azygos anterior cerebral artery with apparent intact supply to the distal ACA regions. Posterior circulation: Both vertebral arteries are patent at the foramen magnum level. 70% stenosis of the distal left V4 segment. Both vertebral arteries do reach the basilar. No focal basilar stenosis. Superior cerebellar and posterior cerebral branch vessels are patent. Venous sinuses: Patent and normal. Anatomic variants: None other significant. Review of the MIP images confirms the above findings CT Brain Perfusion Findings: ASPECTS: 8 or possibly 7 CBF (<30%) Volume: 89mL Perfusion (Tmax>6.0s) volume: 149mL Mismatch Volume: 152mL Infarction Location:Left insula and frontal operculum and underlying white matter Hypoperfusion index of 0.5 suggests poor collateral flow. IMPRESSION: Occlusion of the left carotid terminus. 31 cc region infarct core in the left insula and operculum. 105 cc of penumbra at risk brain throughout the remainder of the left MCA territory. Widespread atherosclerotic disease. Stenoses of both common carotid arteries along their course to the  bifurcations, 40% on the right and 50% on the left. Extensive soft and calcified plaque at both carotid bifurcations  and ICA bulbs. No measurable stenosis on the right. 30-40% stenosis on the left, possibly under measured because of decreased size of the more distal internal carotid artery. Severe atherosclerotic disease in both carotid siphon regions with severe Earl severe stenoses. Atherosclerotic narrowing and irregularity throughout the right MCA vessels and both PCA vessels. Azygos anterior cerebral anatomy results in protection of the anterior cerebral territories. These results were communicated to Dr. Leonel Ramsay at 9:01 amon 5/16/2021by text page via the [facility] messaging system. Electronically Signed   By: Nelson Chimes M.D.   On: 10/02/2019 09:04   DG Chest Port 1 View  Result Date: 10/04/2019 CLINICAL DATA:  Acute respiratory failure. EXAM: PORTABLE CHEST 1 VIEW COMPARISON:  10/02/2019 FINDINGS: The endotracheal tube and NG tubes are stable. New left PICC line tip is in the right atrium and could be retracted 2-3 cm. Persistent left basilar process, atelectasis versus infiltrate. The right lung remains clear. IMPRESSION: 1. New left PICC line tip is in the right atrium and could be retracted 2-3 cm. 2. Persistent left basilar process, atelectasis versus infiltrate. Electronically Signed   By: Marijo Sanes M.D.   On: 10/04/2019 07:12   DG Chest Port 1 View  Result Date: 10/02/2019 CLINICAL DATA:  Endotracheal 2 placement. EXAM: PORTABLE CHEST 1 VIEW COMPARISON:  None. FINDINGS: Endotracheal tube tip projects at the carina near the origin of the right mainstem bronchus. Recommend retracting 1-2 cm. Nasal/orogastric tube passes below the diaphragm, well within the stomach. Cardiac silhouette is normal in size. No mediastinal or hilar masses. There is opacity at the left lung base accentuated by low lung volumes, which may reflect atelectasis or infection. Remainder of the lungs is clear. No  convincing pleural effusion and no pneumothorax. Skeletal structures are grossly intact. IMPRESSION: 1. Endotracheal tube tip projects at the carina, at the origin of the right mainstem bronchus. Recommend retracting 1-2 cm for more optimal positioning. Critical Value/emergent results were called by telephone at the time of interpretation on 10/02/2019 at 2:48 pm to Sarah, the patient's nurse, who verbally acknowledged these results. 2. Well-positioned nasal/orogastric tube. 3. Left lung base opacity which may reflect atelectasis or pneumonia. Electronically Signed   By: Lajean Manes M.D.   On: 10/02/2019 14:49   DG Abd Portable 1V  Result Date: 10/02/2019 CLINICAL DATA:  Orogastric tube placement EXAM: PORTABLE ABDOMEN - 1 VIEW COMPARISON:  Chest x-ray same date FINDINGS: Scattered gas-filled loops of bowel throughout the abdomen. Gastric tube coiled in the stomach, tip directed towards the gastric fundus. Spinal degenerative changes. No acute bone finding. Limited assessment excludes the pelvis. IMPRESSION: 1. Gastric tube coiled in the stomach, tip directed towards the gastric fundus. 2. Scattered gas-filled loops of bowel throughout the abdomen. Correlate with any signs of ileus. Electronically Signed   By: Zetta Bills M.D.   On: 10/02/2019 14:41   ECHOCARDIOGRAM COMPLETE  Result Date: 10/03/2019    ECHOCARDIOGRAM REPORT   Patient Name:   Riley Wagner Date of Exam: 10/03/2019 Medical Rec #:  [id number]    Height:       67.0 in Accession #:    [id number]   Weight:       171.3 lb Date of Birth:  1955/08/26    BSA:          1.893 m Patient Age:    64 years     BP:           96/78 mmHg Patient Gender: M  HR:           67 bpm. Exam Location:  Inpatient Procedure: 2D Echo Indications:    stroke 434. 91  History:        Patient has no prior history of Echocardiogram examinations.                 Previous Myocardial Infarction; Risk Factors:Hypertension and                 Former Smoker.  Sonographer:     Jannett Celestine RDCS (AE) Referring Phys: [phone] MCNEILL P KIRKPATRICK  Sonographer Comments: Echo performed with patient supine and on artificial respirator. Image acquisition challenging due to respiratory motion. limited mobility IMPRESSIONS  1. Left ventricular ejection fraction, by estimation, is 55 to 60%. The left ventricle has normal function. The left ventricle has no regional wall motion abnormalities. There is mild concentric left ventricular hypertrophy. Left ventricular diastolic parameters are consistent with Grade II diastolic dysfunction (pseudonormalization). Elevated left ventricular end-diastolic pressure.  2. Right ventricular systolic function is normal. The right ventricular size is normal.  3. Left atrial size was mild to moderately dilated.  4. The mitral valve is normal in structure. No evidence of mitral valve regurgitation. No evidence of mitral stenosis.  5. The aortic valve is tricuspid. Aortic valve regurgitation is not visualized. No aortic stenosis is present. Conclusion(s)/Recommendation(s): Normal biventricular function without evidence of hemodynamically significant valvular heart disease. FINDINGS  Left Ventricle: Left ventricular ejection fraction, by estimation, is 55 to 60%. The left ventricle has normal function. The left ventricle has no regional wall motion abnormalities. The left ventricular internal cavity size was normal in size. There is  mild concentric left ventricular hypertrophy. Left ventricular diastolic parameters are consistent with Grade II diastolic dysfunction (pseudonormalization). Elevated left ventricular end-diastolic pressure. Right Ventricle: The right ventricular size is normal. No increase in right ventricular wall thickness. Right ventricular systolic function is normal. Left Atrium: Left atrial size was mild to moderately dilated. Right Atrium: Right atrial size was normal in size. Pericardium: There is no evidence of pericardial effusion. Mitral  Valve: The mitral valve is normal in structure. No evidence of mitral valve regurgitation. No evidence of mitral valve stenosis. Tricuspid Valve: The tricuspid valve is normal in structure. Tricuspid valve regurgitation is trivial. No evidence of tricuspid stenosis. Aortic Valve: The aortic valve is tricuspid. Aortic valve regurgitation is not visualized. No aortic stenosis is present. Pulmonic Valve: The pulmonic valve was not well visualized. Pulmonic valve regurgitation is not visualized. No evidence of pulmonic stenosis. Aorta: The aortic root and ascending aorta are structurally normal, with no evidence of dilitation. Venous: IVC assessment for right atrial pressure unable to be performed due to mechanical ventilation. IAS/Shunts: No atrial level shunt detected by color flow Doppler.  LEFT VENTRICLE PLAX 2D LVIDd:         4.80 cm  Diastology LVIDs:         3.40 cm  LV e' lateral:   6.74 cm/s LV PW:         1.30 cm  LV E/e' lateral: 18.0 LV IVS:        1.40 cm  LV e' medial:    5.00 cm/s LVOT diam:     2.30 cm  LV E/e' medial:  24.2 LV SV:         84 LV SV Index:   45 LVOT Area:     4.15 cm  RIGHT VENTRICLE TAPSE (M-mode): 2.7 cm LEFT ATRIUM  Index       RIGHT ATRIUM           Index LA diam:        3.50 cm 1.85 cm/m  RA Area:     14.00 cm LA Vol (A2C):   80.8 ml 42.67 ml/m RA Volume:   31.80 ml  16.80 ml/m LA Vol (A4C):   40.3 ml 21.28 ml/m LA Biplane Vol: 61.7 ml 32.59 ml/m  AORTIC VALVE LVOT Vmax:   122.00 cm/s LVOT Vmean:  89.600 cm/s LVOT VTI:    0.203 m  AORTA Ao Root diam: 3.30 cm MITRAL VALVE MV Area (PHT): 3.37 cm     SHUNTS MV Decel Time: 225 msec     Systemic VTI:  0.20 m MV E velocity: 121.00 cm/s  Systemic Diam: 2.30 cm MV A velocity: 144.00 cm/s MV E/A ratio:  0.84 Buford Dresser MD Electronically signed by Buford Dresser MD Signature Date/Time: 10/03/2019/9:56:03 PM    Final    CT HEAD CODE STROKE WO CONTRAST  Result Date: 10/02/2019 CLINICAL DATA:  Code stroke.  Code stroke. Gaze disturbance. Aphasia. EXAM: CT HEAD WITHOUT CONTRAST TECHNIQUE: Contiguous axial images were obtained from the base of the skull through the vertex without intravenous contrast. COMPARISON:  None. FINDINGS: Brain: Background pattern of atrophy and chronic small-vessel ischemic changes. Probably old left parietal cortical and subcortical infarction. Acute appearing cytotoxic edema in the insula and frontal operculum on the left consistent with a acute/recent infarction. No evidence of hemorrhage or mass effect. No hydrocephalus or extra-axial collection. Vascular: There is atherosclerotic calcification of the major vessels at the base of the brain. Skull: Negative Sinuses/Orbits: Mucosal inflammatory changes.  Orbits negative. Other: None ASPECTS ([place]ill Stroke Program Early CT Score) - Ganglionic level infarction (caudate, lentiform nuclei, internal capsule, insula, M1-M3 cortex): 5 - Supraganglionic infarction (M4-M6 cortex): 3, allowing that the left parietal changes are old. Total score (0-10 with 10 being normal): 8 IMPRESSION: 1. Acute cytotoxic edema in the left insula and frontal operculum. Left parietal cortical and subcortical abnormality that I favor is old. Some possibility a could be subacute. Extensive chronic small-vessel ischemic changes elsewhere. No hemorrhage. 2. ASPECTS is 8 3. These results were communicated to Dr. Leonel Ramsay at 8:36 amon 5/16/2021by text page via the [facility] messaging system. Electronically Signed   By: Nelson Chimes M.D.   On: 10/02/2019 08:37   [place] EKG SITE RITE  Result Date: 10/03/2019 If Site Rite image not attached, placement could not be confirmed due to current cardiac rhythm.   Labs:  CBC: Recent Labs    10/02/19 0826 10/02/19 0826 10/02/19 0831 10/02/19 1332 10/03/19 0213 10/04/19 0245  WBC 11.1*  --   --   --  9.4 14.3*  HGB 15.7   < > 17.0 16.0 14.0 13.5  HCT 47.1   < > 50.0 47.0 41.5 41.5  PLT 288  --   --   --  269 216   < > =  values in this interval not displayed.    COAGS: Recent Labs    10/02/19 0826  INR 1.1  APTT 29    BMP: Recent Labs    10/02/19 0826 10/02/19 0826 10/02/19 0831 10/02/19 0831 10/02/19 1332 10/02/19 2038 10/03/19 0213 10/03/19 0802 10/03/19 1958 10/04/19 0245 10/04/19 0812 10/04/19 1216  NA 141   < > 144   < > 145   < > 145   < > 156* 155* 169* 157*  K 3.4*   < >  3.2*  --  3.0*  --  3.6  --   --  3.6  --   --   CL 104  --  108  --   --   --  117*  --   --  125*  --   --   CO2 22  --   --   --   --   --  19*  --   --  20*  --   --   GLUCOSE 116*  --  110*  --   --   --  145*  --   --  164*  --   --   BUN 21  --  28*  --   --   --  12  --   --  15  --   --   CALCIUM 9.4  --   --   --   --   --  8.3*  --   --  8.8*  --   --   CREATININE 1.60*  --  1.50*  --   --   --  1.37*  --   --  1.27*  --   --   GFRNONAA 45*  --   --   --   --   --  54*  --   --  59*  --   --   GFRAA 52*  --   --   --   --   --  >60  --   --  >60  --   --    < > = values in this interval not displayed.    LIVER FUNCTION TESTS: Recent Labs    10/02/19 0826  BILITOT 1.3*  AST 43*  ALT 28  ALKPHOS 65  PROT 7.0  ALBUMIN 4.0    Assessment and Plan:  History of acute CVA s/p cerebral arteriogram with emergent mechanical thrombectomy of proximal left MCA M1 occlusion, along with balloon angioplasty of right ICA/MCA stenosis, followed by coil embolization of right ICA terminus secondary to persistent bleeding (seen on first angiogram prior to thrombectomy) 10/02/2019 by Dr. Karenann Cai. Patient's condition stable- remains intubated/sedated, left side withdraws from pain, no movements of right side. Right groin incision stable, distal pulses (PTs) palpable bilaterally with Doppler. Further plans per neurology/CCM- appreciate and agree with management. Please call NIR with questions/concerns.   Electronically Signed: Earley Abide, PA-C 10/04/2019, 1:04 PM   I spent a total of 25 Minutes  at the the patient's bedside AND on the patient's [facility] floor or unit, greater than 50% of which was counseling/coordinating care for CVA s/p revascularization.

## 2019-10-04 NOTE — Progress Notes (Signed)
SLP Cancellation Note  Patient Details Name: Riley Wagner MRN: [id number] DOB: [birth year]   Cancelled treatment:       Reason Eval/Treat Not Completed: Medical issues which prohibited therapy (on vent). Will continue to follow.    Osie Bond., M.A. [facility] Pager [phone] Office [phone]  10/04/2019, 7:57 AM

## 2019-10-04 NOTE — Progress Notes (Signed)
Pt transported to CT and back to room with no events to report.

## 2019-10-04 NOTE — Progress Notes (Signed)
STROKE TEAM PROGRESS NOTE   INTERVAL HISTORY Pt RN is at the bedside. Pt still intubated on sedation. Na 156->3% stopped ->155->169?, not sure if lab error. So far 3% still on hold. CT repeat this am showed increased cerebral edema from left MCA infarct and ICH but also found to have small right parietal infarct. Pt still purposeful moving left UE and Left LE. Right side flaccid.    Vitals:   10/04/19 0815 10/04/19 0818 10/04/19 0900 10/04/19 1000  BP: 133/76 133/70 (!) 144/80 (!) 146/75  Pulse: 77 78 73 75  Resp: 18 18 (!) 21 (!) 24  Temp:      TempSrc:      SpO2: 99% 94% 96% 97%  Weight:      Height:        CBC:  Recent Labs  Lab 10/02/19 0826 10/02/19 0831 10/03/19 0213 10/04/19 0245  WBC 11.1*   < > 9.4 14.3*  NEUTROABS 8.9*  --   --   --   HGB 15.7   < > 14.0 13.5  HCT 47.1   < > 41.5 41.5  MCV 93.3   < > 93.7 96.7  PLT 288   < > 269 216   < > = values in this interval not displayed.    Basic Metabolic Panel:  Recent Labs  Lab 10/03/19 0213 10/03/19 0802 10/03/19 1724 10/03/19 1958 10/04/19 0245 10/04/19 0812  NA 145   < >  --    < > 155* 169*  K 3.6  --   --   --  3.6  --   CL 117*  --   --   --  125*  --   CO2 19*  --   --   --  20*  --   GLUCOSE 145*  --   --   --  164*  --   BUN 12  --   --   --  15  --   CREATININE 1.37*  --   --   --  1.27*  --   CALCIUM 8.3*  --   --   --  8.8*  --   MG  --    < > 2.2  --  2.4  --   PHOS  --    < > 1.8*  --  1.6*  --    < > = values in this interval not displayed.   Lipid Panel:     Component Value Date/Time   CHOL 176 10/03/2019 0216   CHOL 231 (H) 02/18/2018 1500   TRIG 106 10/04/2019 0245   HDL 30 (L) 10/03/2019 0216   HDL 41 02/18/2018 1500   CHOLHDL 5.9 10/03/2019 0216   VLDL UNABLE TO CALCULATE IF TRIGLYCERIDE OVER 400 mg/dL 10/03/2019 0216   LDLCALC UNABLE TO CALCULATE IF TRIGLYCERIDE OVER 400 mg/dL 10/03/2019 0216   LDLCALC 153 (H) 02/18/2018 1500   HgbA1c:  Lab Results  Component Value Date    HGBA1C 5.9 (H) 10/03/2019   Urine Drug Screen:     Component Value Date/Time   LABOPIA NONE DETECTED 10/02/2019 1309   COCAINSCRNUR POSITIVE (A) 10/02/2019 1309   LABBENZ NONE DETECTED 10/02/2019 1309   AMPHETMU NONE DETECTED 10/02/2019 1309   THCU POSITIVE (A) 10/02/2019 1309   LABBARB NONE DETECTED 10/02/2019 1309    Alcohol Level No results found for: ETH  IMAGING past 24 hours CT HEAD WO CONTRAST  Result Date: 10/04/2019 CLINICAL DATA:  Stroke follow-up EXAM: CT HEAD  WITHOUT CONTRAST TECHNIQUE: Contiguous axial images were obtained from the base of the skull through the vertex without intravenous contrast. COMPARISON:  Yesterday FINDINGS: Brain: Confluent left MCA territory infarct with stable staining at the left basal ganglia. Right basal ganglia staining is resolved and subarachnoid contrast continues to diminish, mainly seen about the left occipital and parietal parasagittal sulci. A small cortically based infarct has developed along the right parietal cortex. Extensive chronic small vessel ischemia with confluent white matter low-density and remote lacunar infarct at the right thalamus. Mild progression of swelling without entrapment or herniation. Midline shift measures 6 mm today. Vascular: Embolized distal left ICA extending into the MCA. Skull: No acute finding. Sinuses/Orbits: Nasopharyngeal and sinus opacification in the setting of intubation. IMPRESSION: 1. A small acute infarct has become apparent in the right parietal cortex. 2. Diminishing extravasated contrast.  No interval hemorrhage. 3. Left MCA infarct with progressive cytotoxic edema/swelling and 6 mm midline shift. Electronically Signed   By: Monte Fantasia M.D.   On: 10/04/2019 05:55   DG Chest Port 1 View  Result Date: 10/04/2019 CLINICAL DATA:  Acute respiratory failure. EXAM: PORTABLE CHEST 1 VIEW COMPARISON:  10/02/2019 FINDINGS: The endotracheal tube and NG tubes are stable. New left PICC line tip is in the right  atrium and could be retracted 2-3 cm. Persistent left basilar process, atelectasis versus infiltrate. The right lung remains clear. IMPRESSION: 1. New left PICC line tip is in the right atrium and could be retracted 2-3 cm. 2. Persistent left basilar process, atelectasis versus infiltrate. Electronically Signed   By: Marijo Sanes M.D.   On: 10/04/2019 07:12   ECHOCARDIOGRAM COMPLETE  Result Date: 10/03/2019    ECHOCARDIOGRAM REPORT   Patient Name:   KRISHAY ALLERT Date of Exam: 10/03/2019 Medical Rec #:  [id number]    Height:       67.0 in Accession #:    [id number]   Weight:       171.3 lb Date of Birth:  [birth year]    BSA:          1.893 m Patient Age:    64 years     BP:           96/78 mmHg Patient Gender: M            HR:           67 bpm. Exam Location:  Inpatient Procedure: 2D Echo Indications:    stroke 434. 91  History:        Patient has no prior history of Echocardiogram examinations.                 Previous Myocardial Infarction; Risk Factors:Hypertension and                 Former Smoker.  Sonographer:    Jannett Celestine RDCS (AE) Referring Phys: [phone] MCNEILL P KIRKPATRICK  Sonographer Comments: Echo performed with patient supine and on artificial respirator. Image acquisition challenging due to respiratory motion. limited mobility IMPRESSIONS  1. Left ventricular ejection fraction, by estimation, is 55 to 60%. The left ventricle has normal function. The left ventricle has no regional wall motion abnormalities. There is mild concentric left ventricular hypertrophy. Left ventricular diastolic parameters are consistent with Grade II diastolic dysfunction (pseudonormalization). Elevated left ventricular end-diastolic pressure.  2. Right ventricular systolic function is normal. The right ventricular size is normal.  3. Left atrial size was mild to moderately dilated.  4. The mitral valve is normal in  structure. No evidence of mitral valve regurgitation. No evidence of mitral stenosis.  5. The aortic valve  is tricuspid. Aortic valve regurgitation is not visualized. No aortic stenosis is present. Conclusion(s)/Recommendation(s): Normal biventricular function without evidence of hemodynamically significant valvular heart disease. FINDINGS  Left Ventricle: Left ventricular ejection fraction, by estimation, is 55 to 60%. The left ventricle has normal function. The left ventricle has no regional wall motion abnormalities. The left ventricular internal cavity size was normal in size. There is  mild concentric left ventricular hypertrophy. Left ventricular diastolic parameters are consistent with Grade II diastolic dysfunction (pseudonormalization). Elevated left ventricular end-diastolic pressure. Right Ventricle: The right ventricular size is normal. No increase in right ventricular wall thickness. Right ventricular systolic function is normal. Left Atrium: Left atrial size was mild to moderately dilated. Right Atrium: Right atrial size was normal in size. Pericardium: There is no evidence of pericardial effusion. Mitral Valve: The mitral valve is normal in structure. No evidence of mitral valve regurgitation. No evidence of mitral valve stenosis. Tricuspid Valve: The tricuspid valve is normal in structure. Tricuspid valve regurgitation is trivial. No evidence of tricuspid stenosis. Aortic Valve: The aortic valve is tricuspid. Aortic valve regurgitation is not visualized. No aortic stenosis is present. Pulmonic Valve: The pulmonic valve was not well visualized. Pulmonic valve regurgitation is not visualized. No evidence of pulmonic stenosis. Aorta: The aortic root and ascending aorta are structurally normal, with no evidence of dilitation. Venous: IVC assessment for right atrial pressure unable to be performed due to mechanical ventilation. IAS/Shunts: No atrial level shunt detected by color flow Doppler.  LEFT VENTRICLE PLAX 2D LVIDd:         4.80 cm  Diastology LVIDs:         3.40 cm  LV e' lateral:   6.74 cm/s LV PW:          1.30 cm  LV E/e' lateral: 18.0 LV IVS:        1.40 cm  LV e' medial:    5.00 cm/s LVOT diam:     2.30 cm  LV E/e' medial:  24.2 LV SV:         84 LV SV Index:   45 LVOT Area:     4.15 cm  RIGHT VENTRICLE TAPSE (M-mode): 2.7 cm LEFT ATRIUM             Index       RIGHT ATRIUM           Index LA diam:        3.50 cm 1.85 cm/m  RA Area:     14.00 cm LA Vol (A2C):   80.8 ml 42.67 ml/m RA Volume:   31.80 ml  16.80 ml/m LA Vol (A4C):   40.3 ml 21.28 ml/m LA Biplane Vol: 61.7 ml 32.59 ml/m  AORTIC VALVE LVOT Vmax:   122.00 cm/s LVOT Vmean:  89.600 cm/s LVOT VTI:    0.203 m  AORTA Ao Root diam: 3.30 cm MITRAL VALVE MV Area (PHT): 3.37 cm     SHUNTS MV Decel Time: 225 msec     Systemic VTI:  0.20 m MV E velocity: 121.00 cm/s  Systemic Diam: 2.30 cm MV A velocity: 144.00 cm/s MV E/A ratio:  0.84 Buford Dresser MD Electronically signed by Buford Dresser MD Signature Date/Time: 10/03/2019/9:56:03 PM    Final     PHYSICAL EXAM  Temp:  [98 F (36.7 C)-100 F (37.8 C)] 99.2 F (37.3 C) (05/18 0800) Pulse Rate:  [  62-104] 75 (05/18 1000) Resp:  [18-28] 24 (05/18 1000) BP: (83-148)/(37-92) 146/75 (05/18 1000) SpO2:  [94 %-99 %] 97 % (05/18 1000) Arterial Line BP: (131-154)/(57-67) 131/57 (05/17 1200) FiO2 (%):  [40 %-50 %] 40 % (05/18 0818) Weight:  [77.4 kg] 77.4 kg (05/18 0500)  General - Well nourished, well developed, intubated on sedation.  Ophthalmologic - fundi not visualized due to noncooperation.  Cardiovascular - Regular rate and rhythm.  Neuro - intubated on sedation, eyes closed, not following commands. With forced eye opening, eyes in more left gaze preference position, not blinking to visual threat, doll's eyes present on the left rolling but not much to the right, not tracking, PERRL. Corneal reflex weakly present on the left but absent on the right, gag and cough present. Breathing over the vent.  Facial symmetry not able to test due to ET tube.  Tongue protrusion not  cooperative. Spontaneous and purposeful movement of LUE and LLE, but not on the right. On pain stimulation, at least 3/5 LUE and 3-/5 LLE, no movement of RUE and RLE. DTR 1+ and no babinski. Sensation, coordination and gait not tested.   ASSESSMENT/PLAN Riley [facility] is a 64 y.o. male with history of HTN who fell the night prior to admission, presenting with right sided weakness and aphasia. Taken to IR for terminus R ICA occlusion.  Stroke:   L MCA infarct due to left M1 occlusion s/p L MCA thrombectomy, infarct likely secondary to L ICA large vessel disease  ICH and SAH: due to left tICA extravasation s/p L ICA coil embolization  Code Stroke CT head cytotoxic edema L insula and frontal operculum. Likely old L parietal cortical and subcortical abnormality. ASPECTS 8    CTA head & neck L ICA occlusion. Widespread atherosclerosis: B CCA to ICA bifurcation R 40%, L 50% w/ extensive soft plaques at bifurcation and bulbs, L 30-40%. Severe B ICA siphon stenoses. R MCA and B PCA atherosclerosis w/ narrowing & irregularity.  CT perfusion 105 penumbra L MCA territory.   Cerebral angio diffuse and severe intracranial atherosclerosis. Proximal L M1 occlusion. Complete recanalization L M1 w/ embotrap w/ poor distal flow d/t severe L ICA stenosis s/p angioplasty. L ICA terminus coil embolization w/ sparing L anterior choroidal.  CT head repeat 5/16 large L basal ganglia hemorrhage/contrast posterior SDH and SAH. Large cytotoxic edema L MCA w/ 55mm midline shift.   CT head 5/17 diminishing SAH contrast. L MCA infarct w/o significant mass effect  CT head 5/18 diminishing SAH. L MCA infarct increased edema, MLS 64mm. Right parietal small new infarct.   MRI and MRA pending in am   2D Echo EF 55-60%  TG 471 and direct LDL 95.3, goal < 70  HgbA1c 5.9   UDS + cocaine and THC  SCDs for VTE prophylaxis  No antithrombotic prior to admission, now on No antithrombotic given hemorrhage.   Therapy  recommendations:  pending   Disposition:  pending   Code status - DNR after family discussion  Cytotoxic Cerebral Edema  Present on admission scan  Repeat CT head 5/17 large left MCA infarct with edema but no significant midline shift  CT 5/18 repeat L MCA infarct increased edema, MLS 78mm. Right parietal small new infarct.  Off 3% now  Goal Na 150-155  Na 141-145-148->152->156->155->169 ??  May consider left hemicrani if cerebral edema worsening and pt family would like aggressive care  Acute hypoxic respiratory failure with compromised airway Atelectasis   Intubated for IR, continued d/t  compromised airway  CCM on board  On vent  On propofol   CXR 5/18 - Persistent left basilar process, atelectasis versus infiltrate  Hypertensive Emergency  SBP > 220 on arrival  Home meds:  Amlodipine 10, coreg 3.125 bid, HCTZ 25, lisinopril 40  On cleviprex  On amlodipine 10, lisinopril 20 bid, hydralazine 100 q6, labetalol 300 Q8  Add clonidine 0.1 tid  BP goal < 140 given ICH, SAH and SDH  . Long-term BP goal normotensive  Hyperlipidemia  Home meds:  lipitor 40, resumed in hospital  TG 471 and TC 176, direct LDL 95.3, goal < 70  On lipitor 40  Continue statin at discharge  Arrhythmia   Frequent PACs vs. Afib on tele  EKG stat - pending  Dysphagia . Secondary to stroke . NPO . On tube feedings @ 25 . Speech on board  Cocaine abuse  UDS positive for cocaine  Cessation education will be provided   Other Stroke Risk Factors  Advanced age  Former Cigarette smoker, quit 5 yrs ago  ETOH use, advised to drink no more than 2 drink(s) a day  THC abuse - UDS:  THC POSITIVE, cessation education will be provided.  Family hx stroke (mother)  Coronary artery disease, MI  Other Active Problems  CKD stage II Cre 1.37->1.27  TG elevated at [employer] day # 2  Patient continues to be critically ill for the last 24 hours, has developed  increased cerebral edema and arrhythmia with hypernatremia, continues to have respiratory failure needing ventilation and sedation, hypertensive emergency needing multiple BP meds, has to add clonidine for better BP control. I discussed with CCM NP Peter. This patient is critically ill due to left MCA occlusion s/p EVT, ICH, SAH and SDH, cerebral edema, respiratory failure, cocaine abuse and at significant risk of neurological worsening, death form recurrent stroke, hematoma expansion, heart failure, seizure, respiratory failure, brain herniation. This patient's care requires constant monitoring of vital signs, hemodynamics, respiratory and cardiac monitoring, review of multiple databases, neurological assessment, discussion with family, other specialists and medical decision making of high complexity. I spent 35 minutes of neurocritical care time in the care of this patient.   Rosalin Hawking, MD PhD Stroke Neurology 10/04/2019 10:07 AM  To contact Stroke Continuity provider, please refer to [email or web address] After hours, contact General Neurology

## 2019-10-04 NOTE — Progress Notes (Signed)
NAME:  Riley Wagner, MRN:  [id number], DOB:  December 26, 1955, LOS: 2 ADMISSION DATE:  10/02/2019, CONSULTATION DATE:  10/02/2019 REFERRING MD:  Dr. Debbrah Alar, neuro IR, CHIEF COMPLAINT:  Altered mental status   Brief History   64 yo male former smoker brought to ER with hypertensive crisis (BP 212/110), Rt sided weakness and aphasia.  Found to have Lt terminal ICA occlusion.  Intubated for airway protection.  Noted to have intraparenchymal bleed along posterior limb of IC as well as Rt sided bleed.  Had embolization of Lt ICA terminus.  Past Medical History  HTN, [facility] Events   5/16 Admit. To IR for embolization of Left ICA. Back to ICU intubated. BP goal 120-140 5/17: still hypertensive in spite of maximum dose Cleviprex.  Continues to exhibit right-sided hemiparesis 5/18: CT brain showing worsening midline shift and cerebral edema.  Still having difficulty with blood pressure control, requiring titration up of oral regimen.  Of note was made DO NOT RESUSCITATE by family on 5/17 Consults:  Neuro IR  Procedures:  ETT 5/16 >> Radial aline 5/16 >>  Significant Diagnostic Tests:   CT head 5/16 >> acute cytotoxic edema in Lt insula and frontal operculum, extensive chronic small vessel ischemic changes  Echo 5/17 >> Left Ventricle: Left ventricular ejection fraction, by estimation, is 55 to 60%. The left ventricle has normal function. The left ventricle has no regional wall motion abnormalities. The left ventricular internal cavity size was normal in size. There is mild concentric left ventricular hypertrophy. Left ventricular diastolic parameters are consistent with Grade II diastolic dysfunction (pseudonormalization). Elevated left ventricular end-diastolic pressure.  CT brain 5/18: small acute infarct in the right parietal cortex, no new hemorrhage, left MCA infarct with progressive cytotoxic edema/swelling in 6 mm midline shift Micro Data:  SARS CoV2 PCR 5/16 >>  negative  Antimicrobials:    Interim history/subjective:  No issues over night   Objective   Blood pressure 133/70, pulse 78, temperature 99.2 F (37.3 C), temperature source Axillary, resp. rate 18, height 5\' 7"  (1.702 m), weight 77.4 kg, SpO2 94 %.    Vent Mode: PRVC FiO2 (%):  [40 %-50 %] 40 % Set Rate:  [18 bmp] 18 bmp Vt Set:  [530 mL] 530 mL PEEP:  [10 cmH20] 10 cmH20 Plateau Pressure:  [16 cmH20-22 cmH20] 22 cmH20   Intake/Output Summary (Last 24 hours) at 10/04/2019 0900 Last data filed at 10/04/2019 0800 Gross per 24 hour  Intake 2452.88 ml  Output 1500 ml  Net 952.88 ml   Filed Weights   10/02/19 1315 10/04/19 0500  Weight: 77.7 kg 77.4 kg    Examination: General 64 year old black male currently sedated on propofol infusion HEENT normocephalic atraumatic pupils pinpoint orally intubated no JVD Pulmonary: Crackles bases, no accessory use.  A little more diminished in the left base currently on PEEP 10, FiO2 40%. Cardiac: Regular rate and rhythm no murmur rub or gallop Abdomen: Soft nontender no organomegaly Extremities: Warm dry brisk cap refill no significant edema. Neuro: Withdraws on the left, right-sided hemiparesis.  Otherwise no response.   Resolved Hospital Problem list     Assessment & Plan:   Acute hypoxic respiratory failure with compromised airway. Atelectasis  Portable chest x-ray personally reviewed by myself 5/18: Endotracheal tube in satisfactory position.  Patient the left basilar volume loss.  Unclear if this represents infiltrate versus atelectasis Plan Continue full ventilator support Add hypertonic saline nebulizer daily Respiratory culture ordered Continue to watch fever curve VAP bundle  Titrate PEEP/FiO2 for saturations greater than 92%   Hypertensive emergency. Hx of HLD. Plan Goal systolic blood pressure 0000000, he remains on Cleviprex Changing Coreg to via tube labetalol Continue scheduled Apresoline via tube Continue  Norvasc Continue lisinopril, watch serum creatinine  Lt ICA occlusion with intraparenchymal bleed along posterior limb of IC as well as Rt sided bleed. -24-hour follow-up CT brain obtained this morning shows small acute infarct in the right parietal cortex, no new hemorrhage, left MCA infarct with progressive cytotoxic edema/swelling in 6 mm midline shift Plan Continue serial neurochecks Ongoing goals of care discussion with family Hypertonic saline on hold given sodium greater than 156 Continue Lipitor  CKD2. Serum creatinine stable  plan Continue to monitor  Mild hyperchloremic non-anion anion gap metabolic acidosis Plan Serial chemistries Holding free water given cerebral edema  Hyperglycemia Plan Sliding scale insulin  Best practice:  Diet: NPO-->start tubefeeds /17 DVT prophylaxis: SCDs GI prophylaxis: protonix Mobility: bed rest Code Status: full code Disposition: ICU   My critical care x 32 minutes  Erick Colace ACNP-BC [place] Pager # [phone] OR # [phone] if no answer

## 2019-10-04 NOTE — Progress Notes (Signed)
PT Cancellation Note  Patient Details Name: Riley Wagner MRN: [id number] DOB: May 31, 1955   Cancelled Treatment:    Reason Eval/Treat Not Completed: Patient not medically ready, patient intubated and sedated.  Will attempt again another day.   Reginia Naas 10/04/2019, 11:00 AM  Magda Kiel, [place] [phone] 10/04/2019

## 2019-10-04 NOTE — Progress Notes (Signed)
OT Cancellation Note  Patient Details Name: Riley Wagner MRN: [id number] DOB: 1956/01/16   Cancelled Treatment:    Reason Eval/Treat Not Completed: Patient not medically ready( patient intubated and sedated. )  Ramond Dial, OT/L   Acute OT Clinical Specialist [place] Pager [phone] Office [phone]  10/04/2019, 11:58 AM

## 2019-10-05 ENCOUNTER — Inpatient Hospital Stay (HOSPITAL_COMMUNITY): Payer: Medicare Other

## 2019-10-05 DIAGNOSIS — N179 Acute kidney failure, unspecified: Secondary | ICD-10-CM

## 2019-10-05 LAB — SODIUM
Sodium: 154 mmol/L — ABNORMAL HIGH (ref 135–145)
Sodium: 156 mmol/L — ABNORMAL HIGH (ref 135–145)
Sodium: 152 mmol/L — ABNORMAL HIGH (ref 135–145)

## 2019-10-05 LAB — CBC
HCT: 36.8 % — ABNORMAL LOW (ref 39.0–52.0)
Hemoglobin: 11.8 g/dL — ABNORMAL LOW (ref 13.0–17.0)
MCH: 30.9 pg (ref 26.0–34.0)
MCHC: 32.1 g/dL (ref 30.0–36.0)
MCV: 96.3 fL (ref 80.0–100.0)
Platelets: 186 10*3/uL (ref 150–400)
RBC: 3.82 MIL/uL — ABNORMAL LOW (ref 4.22–5.81)
RDW: 16 % — ABNORMAL HIGH (ref 11.5–15.5)
WBC: 11.5 10*3/uL — ABNORMAL HIGH (ref 4.0–10.5)
nRBC: 0 % (ref 0.0–0.2)

## 2019-10-05 LAB — BASIC METABOLIC PANEL
Anion gap: 12 (ref 5–15)
BUN: 30 mg/dL — ABNORMAL HIGH (ref 8–23)
CO2: 22 mmol/L (ref 22–32)
Calcium: 8.7 mg/dL — ABNORMAL LOW (ref 8.9–10.3)
Chloride: 121 mmol/L — ABNORMAL HIGH (ref 98–111)
Creatinine, Ser: 1.51 mg/dL — ABNORMAL HIGH (ref 0.61–1.24)
GFR calc Af Amer: 55 mL/min — ABNORMAL LOW (ref >60)
GFR calc non Af Amer: 48 mL/min — ABNORMAL LOW (ref >60)
Glucose, Bld: 142 mg/dL — ABNORMAL HIGH (ref 70–99)
Potassium: 3.5 mmol/L (ref 3.5–5.1)
Sodium: 155 mmol/L — ABNORMAL HIGH (ref 135–145)

## 2019-10-05 LAB — GLUCOSE, CAPILLARY
Glucose-Capillary: 120 mg/dL — ABNORMAL HIGH (ref 70–99)
Glucose-Capillary: 128 mg/dL — ABNORMAL HIGH (ref 70–99)
Glucose-Capillary: 132 mg/dL — ABNORMAL HIGH (ref 70–99)
Glucose-Capillary: 133 mg/dL — ABNORMAL HIGH (ref 70–99)
Glucose-Capillary: 146 mg/dL — ABNORMAL HIGH (ref 70–99)
Glucose-Capillary: 149 mg/dL — ABNORMAL HIGH (ref 70–99)

## 2019-10-05 LAB — TRIGLYCERIDES: Triglycerides: 88 mg/dL (ref <150)

## 2019-10-05 MED ORDER — VITAL 1.5 CAL PO LIQD: 1000.0000 mL | ORAL | Status: DC

## 2019-10-05 MED ORDER — LABETALOL HCL 100 MG PO TABS
200.0000 mg | ORAL_TABLET | Freq: Three times a day (TID) | ORAL | Status: DC
  Administered 2019-10-05 – 2019-10-07 (×5): 200 mg
  Filled 2019-10-05 (×6): qty 2

## 2019-10-05 MED ORDER — POTASSIUM CHLORIDE 20 MEQ PO PACK
40.0000 meq | PACK | Freq: Once | ORAL | Status: AC
  Administered 2019-10-05: 40 meq
  Filled 2019-10-05: qty 2

## 2019-10-05 MED ORDER — VITAL 1.5 CAL PO LIQD
1000.0000 mL | ORAL | Status: DC
  Administered 2019-10-06: 1000 mL
  Filled 2019-10-05 (×3): qty 1000

## 2019-10-05 MED ORDER — ASPIRIN 325 MG PO TABS
325.0000 mg | ORAL_TABLET | Freq: Every day | ORAL | Status: DC
  Administered 2019-10-05 – 2019-10-07 (×3): 325 mg
  Filled 2019-10-05 (×3): qty 1

## 2019-10-05 MED ORDER — SODIUM CHLORIDE 23.4 % INJECTION (4 MEQ/ML) FOR IV ADMINISTRATION
120.0000 meq | Freq: Once | INTRAVENOUS | Status: AC
  Administered 2019-10-05: 120 meq via INTRAVENOUS
  Filled 2019-10-05: qty 30

## 2019-10-05 MED ORDER — PRO-STAT SUGAR FREE PO LIQD
30.0000 mL | Freq: Three times a day (TID) | ORAL | Status: DC
  Administered 2019-10-05 – 2019-10-07 (×6): 30 mL
  Filled 2019-10-05 (×6): qty 30

## 2019-10-05 MED ORDER — VITAL HIGH PROTEIN PO LIQD: 1000.0000 mL | ORAL | Status: DC

## 2019-10-05 MED ORDER — ENOXAPARIN SODIUM 40 MG/0.4ML ~~LOC~~ SOLN
40.0000 mg | SUBCUTANEOUS | Status: DC
  Administered 2019-10-05 – 2019-10-12 (×8): 40 mg via SUBCUTANEOUS
  Filled 2019-10-05 (×8): qty 0.4

## 2019-10-05 MED ORDER — VITAL HIGH PROTEIN PO LIQD
1000.0000 mL | ORAL | Status: AC
  Administered 2019-10-05: 1000 mL

## 2019-10-05 MED ORDER — HYDRALAZINE HCL 50 MG PO TABS
50.0000 mg | ORAL_TABLET | Freq: Three times a day (TID) | ORAL | Status: DC
  Administered 2019-10-05 – 2019-10-07 (×5): 50 mg
  Filled 2019-10-05 (×6): qty 1

## 2019-10-05 MED ORDER — HYDRALAZINE HCL 50 MG PO TABS: 100.0000 mg | ORAL_TABLET | Freq: Three times a day (TID) | ORAL | Status: DC

## 2019-10-05 NOTE — Progress Notes (Signed)
STROKE TEAM PROGRESS NOTE   INTERVAL HISTORY RN at bedside.  Patient back from MRI which showed large left MCA infarct with 8 mm midline shift, right parietal and frontal infarcts. I had long discussion with 2 daughters at bedside and the son on the phone, updated pt current condition, treatment plan and potential prognosis, and answered all the questions.  They expressed understanding and appreciation.    Vitals:   10/05/19 0757 10/05/19 0800 10/05/19 0815 10/05/19 0817  BP: 122/74 (!) 172/100 140/75 119/77  Pulse:  88 85 79  Resp: (!) 31 (!) 29 (!) 30 (!) 27  Temp:      TempSrc:      SpO2:  99% 98% 98%  Weight:      Height:       CBC:  Recent Labs  Lab 10/02/19 0826 10/02/19 0831 10/04/19 0245 10/05/19 0723  WBC 11.1*   < > 14.3* 11.5*  NEUTROABS 8.9*  --   --   --   HGB 15.7   < > 13.5 11.8*  HCT 47.1   < > 41.5 36.8*  MCV 93.3   < > 96.7 96.3  PLT 288   < > 216 186   < > = values in this interval not displayed.   Basic Metabolic Panel:  Recent Labs  Lab 10/04/19 0245 10/04/19 0812 10/04/19 1700 10/04/19 1734 10/05/19 0000 10/05/19 0723  NA 155*   < >  --    < > 154* 155*  K 3.6  --   --   --   --  3.5  CL 125*  --   --   --   --  121*  CO2 20*  --   --   --   --  22  GLUCOSE 164*  --   --   --   --  142*  BUN 15  --   --   --   --  30*  CREATININE 1.27*  --   --   --   --  1.51*  CALCIUM 8.8*  --   --   --   --  8.7*  MG 2.4  --  2.3  --   --   --   PHOS 1.6*  --  4.2  --   --   --    < > = values in this interval not displayed.   Lipid Panel:     Component Value Date/Time   CHOL 176 10/03/2019 0216   CHOL 231 (H) 02/18/2018 1500   TRIG 88 10/05/2019 0723   HDL 30 (L) 10/03/2019 0216   HDL 41 02/18/2018 1500   CHOLHDL 5.9 10/03/2019 0216   VLDL UNABLE TO CALCULATE IF TRIGLYCERIDE OVER 400 mg/dL 10/03/2019 0216   LDLCALC UNABLE TO CALCULATE IF TRIGLYCERIDE OVER 400 mg/dL 10/03/2019 0216   LDLCALC 153 (H) 02/18/2018 1500   HgbA1c:  Lab Results   Component Value Date   HGBA1C 5.9 (H) 10/03/2019   Urine Drug Screen:     Component Value Date/Time   LABOPIA NONE DETECTED 10/02/2019 1309   COCAINSCRNUR POSITIVE (A) 10/02/2019 1309   LABBENZ NONE DETECTED 10/02/2019 1309   AMPHETMU NONE DETECTED 10/02/2019 1309   THCU POSITIVE (A) 10/02/2019 1309   LABBARB NONE DETECTED 10/02/2019 1309    Alcohol Level No results found for: ETH  IMAGING past 24 hours No results found.  PHYSICAL EXAM   Temp:  [98.6 F (37 C)-99.3 F (37.4 C)] 98.8 F (37.1 C) (  05/19 0400) Pulse Rate:  [57-88] 79 (05/19 0817) Resp:  [14-32] 27 (05/19 0817) BP: (80-172)/(54-100) 119/77 (05/19 0817) SpO2:  [95 %-100 %] 98 % (05/19 0817) FiO2 (%):  [40 %] 40 % (05/19 0757) Weight:  [77 kg] 77 kg (05/19 0500)  General - Well nourished, well developed, intubated on sedation.  Ophthalmologic - fundi not visualized due to noncooperation.  Cardiovascular - Regular rate and rhythm.  Neuro - intubated on sedation, eyes closed, not following commands. With forced eye opening, eyes in more left gaze preference position, not blinking to visual threat, doll's eyes sluggish, not tracking, PERRL. Corneal reflex weakly present on the left but absent on the right, gag and cough present. Breathing over the vent.  Facial symmetry not able to test due to ET tube.  Tongue protrusion not cooperative. Spontaneous and purposeful movement of LUE and LLE, but not on the right. On pain stimulation, at least 3/5 LUE and 3-/5 LLE, no movement of RUE and RLE. DTR 1+ and no babinski. Sensation, coordination and gait not tested.   ASSESSMENT/PLAN Riley Wagner is a 64 y.o. male with history of HTN who fell the night prior to admission, presenting with right sided weakness and aphasia. Taken to IR for terminus R ICA occlusion.  Stroke:   L MCA infarcts due to left M1 occlusion s/p unsuccessful L MCA thrombectomy - L MCA infarct likely secondary to L ICA large vessel disease.  Stroke:    Right MCA infarcts in setting of cocaine use and right ICA and MCA large vessel stenoses     Code Stroke CT head cytotoxic edema L insula and frontal operculum. Likely old L parietal cortical and subcortical abnormality. ASPECTS 8    CTA head & neck L ICA occlusion. Widespread atherosclerosis: B CCA to ICA bifurcation R 40%, L 50% w/ extensive soft plaques at bifurcation and bulbs, L 30-40%. Severe B ICA siphon stenoses. R MCA and B PCA atherosclerosis w/ narrowing & irregularity.  CT perfusion 105 penumbra L MCA territory.   Cerebral angio diffuse and severe intracranial atherosclerosis. Proximal L M1 occlusion. Complete recanalization L M1 w/ embotrap w/ poor distal flow d/t severe L ICA stenosis s/p angioplasty. L ICA terminus coil embolization w/ sparing L anterior choroidal.  CT head repeat 5/16 large L basal ganglia hemorrhage/contrast posterior SDH and SAH. Large cytotoxic edema L MCA w/ 28mm midline shift.   CT head 5/17 diminishing SAH contrast. L MCA infarct w/o significant mass effect  CT head 5/18 diminishing SAH. L MCA infarct increased edema, MLS 46mm. Right parietal small new infarct.   MRI evolving B cerebral infarcts including large L MCA infarct w/ edema and 69mm R midline shift, right parietal and frontal infarcts. No significant hemorrhage.  MRA No flow L carotid systeme following thrombectomy. Advanced intracranial atherosclerosis including moderate R ICA, mild R M1, severe proximal R M2 and severe L V4 stenoses.    2D Echo EF 55-60%  TG 471 and direct LDL 95.3, goal < 70  HgbA1c 5.9   UDS + cocaine and THC  lovenox for VTE prophylaxis  No antithrombotic prior to admission, now on ASA 325mg  daily.  Therapy recommendations:  pending   Disposition:  pending   Code status - DNR. Family likely to make decision about trach vs. Comfort care in a few days  Cerebral Edema  Present on admission scan  Repeat CT head 5/17 large left MCA infarct with edema but no  significant midline shift  CT 5/18 repeat  L MCA infarct increased edema, MLS 47mm. Right parietal small new infarct.  MRI 5/19 - large L MCA infarct with MLS 85mm  Off 3% now  Goal Na 150-155  Na 141-145-148->152->156->155->154->154->155->152  23.4% saline x 1    Acute hypoxic respiratory failure with compromised airway Atelectasis   Intubated for IR, continued d/t compromised airway  CCM on board  On vent  On propofol   CXR 5/18 - Persistent left basilar process, atelectasis versus infiltrate  Hypertensive Emergency  SBP > 220 on arrival  Home meds:  Amlodipine 10, coreg 3.125 bid, HCTZ 25, lisinopril 40  Off cleviprex now  On amlodipine 10, hydralazine 50 q8, labetalol 200 Q8  BP goal < 140 given ICH, SAH and SDH   BP on the low side . Long-term BP goal normotensive  Hyperlipidemia  Home meds:  lipitor 40, resumed in hospital  TG 471 and TC 176, direct LDL 95.3, goal < 70  On lipitor 40  Continue statin at discharge  Dysphagia . Secondary to stroke . NPO . On tube feedings @ 40 . Speech on board  Cocaine abuse  UDS positive for cocaine  Cessation education will be provided   Other Stroke Risk Factors  Advanced age  Former Cigarette smoker, quit 5 yrs ago  ETOH use, advised to drink no more than 2 drink(s) a day  THC abuse - UDS:  THC POSITIVE, cessation education will be provided.  Family hx stroke (mother)  Coronary artery disease, MI  Other Active Problems  CKD stage II Cre 1.37->1.27->1.51  TG elevated at 476->471->106->88  Hospital day # 3  Patient continues to be critically ill for the last 24 hours, has developed increased cerebral edema more than yesterday on MRI needing 23.4% saline administration, continues to have respiratory failure needing ventilation and sedation, BP on the low side needing medication adjustment. I discussed with CCM Dr. Lynetta Mare. This patient is critically ill due to left MCA occlusion s/p EVT, ICH,  SAH and SDH, cerebral edema, respiratory failure, cocaine abuse and at significant risk of neurological worsening, death form recurrent stroke, hematoma expansion, heart failure, seizure, respiratory failure, brain herniation. This patient's care requires constant monitoring of vital signs, hemodynamics, respiratory and cardiac monitoring, review of multiple databases, neurological assessment, discussion with family, other specialists and medical decision making of high complexity. I spent 40 minutes of neurocritical care time in the care of this patient.  I had long discussion with 2 daughters at bedside and the son on the phone, updated pt current condition, treatment plan and potential prognosis, and answered all the questions.  They expressed understanding and appreciation.  Rosalin Hawking, MD PhD Stroke Neurology 10/05/2019 8:44 AM  To contact Stroke Continuity provider, please refer to [email or web address] After hours, contact General Neurology

## 2019-10-05 NOTE — Progress Notes (Signed)
OT Cancellation Note  Patient Details Name: Riley Wagner MRN: [id number] DOB: [birth year]   Cancelled Treatment:    Reason Eval/Treat Not Completed: Patient not medically ready (intubated sedated)   Billey Chang, OTR/L  Acute Rehabilitation Services Pager: [phone] Office: [phone] .  10/05/2019, 8:26 AM

## 2019-10-05 NOTE — Progress Notes (Signed)
SLP Cancellation Note  Patient Details Name: Riley Wagner MRN: [id number] DOB: [birth year]   Cancelled treatment:       Reason Eval/Treat Not Completed: Patient not medically ready. He remains intubated. Per discussed with RN, will sign off for now. Please reorder when ready.     Osie Bond., M.A. [facility] Pager [phone] Office [phone]  10/05/2019, 10:49 AM

## 2019-10-05 NOTE — Progress Notes (Signed)
Nutrition Follow-up  DOCUMENTATION CODES:   Not applicable  INTERVENTION:   Tube feeding via OG tube: Vital 1.5 at 40 ml/h (960 ml per day) Pro-stat 30 ml TID MVI daily  Provides 1740 kcal, 109 gm protein, 729 ml free water daily  TF regimen and propofol at current rate providing 2109 total kcal/day    NUTRITION DIAGNOSIS:   Inadequate oral intake related to inability to eat as evidenced by NPO status. Ongoing.   GOAL:   Patient will meet greater than or equal to 90% of their needs Progressing  MONITOR:   TF tolerance, Labs  REASON FOR ASSESSMENT:   Consult, Ventilator Enteral/tube feeding initiation and management  ASSESSMENT:   Pt with PMH of HTN, HLD, and former smoker now admitted with L MCA infarct due to hypertensive crisis. Pt s/p emergent thrombectomy of L MCA M1 occlusion, balloon angioplasty of R ICA stenosis followed by coil embolization of R ICA secondary to persistent bleeding.   Pt discussed during ICU rounds and with RN.  Pt off cleviprex, remains on propofol but RN hopes to wean down. Down for MRI currently.   Patient is currently intubated on ventilator support MV: 16.8 L/min Temp (24hrs), Avg:99 F (37.2 C), Min:98.6 F (37 C), Max:99.6 F (37.6 C)  Propofol: 14 ml/hr provides: 369 Cleviprex: off Medications review ed and include: colace, MVI, miralax, 40 mEq KCl daily Hypertonic saline  Labs reviewed: Na 155 (H) CBG's: 149-132 OG tube; tip gastric    TF: Vital High Protein at 25 ml/h and Pro-stat 60 ml BID Provides 1000 kcal, 112 gm protein  Diet Order:   Diet Order            Diet NPO time specified  Diet effective now              EDUCATION NEEDS:   No education needs have been identified at this time  Skin:  Skin Assessment: Reviewed RN Assessment  Last BM:  unknown  Height:   Ht Readings from Last 1 Encounters:  10/02/19 5\' 7"  (1.702 m)    Weight:   Wt Readings from Last 1 Encounters:  10/05/19 77 kg     Ideal Body Weight:  67.2 kg  BMI:  Body mass index is 26.59 kg/m.  Estimated Nutritional Needs:   Kcal:  2039  Protein:  100-120 grams  Fluid:  > 1.9 L/day  Lockie Pares., RD, LDN, CNSC See AMiON for contact information

## 2019-10-05 NOTE — Progress Notes (Signed)
PT Cancellation Note  Patient Details Name: Riley Wagner MRN: [id number] DOB: 07/24/1955   Cancelled Treatment:    Reason Eval/Treat Not Completed: Medical issues which prohibited therapy. Intubated and unable to wean sedation per RN.   Zenaida Niece 10/05/2019, 9:08 AM

## 2019-10-05 NOTE — Progress Notes (Signed)
NAME:  Estus Rito, MRN:  [id number], DOB:  [birth year], LOS: 3 ADMISSION DATE:  10/02/2019, CONSULTATION DATE:  10/02/2019 REFERRING MD:  Dr. Debbrah Alar, neuro IR, CHIEF COMPLAINT:  Altered mental status   Brief History   64 yo male former smoker brought to ER with hypertensive crisis (BP 212/110), Rt sided weakness and aphasia.  Found to have Lt terminal ICA occlusion.  Intubated for airway protection.  Noted to have intraparenchymal bleed along posterior limb of IC as well as Rt sided bleed.  Had embolization of Lt ICA terminus.  Past Medical History  HTN, [place] [facility] Events   5/16 Admit. To IR for embolization of Left ICA. Back to ICU intubated. BP goal 120-140 5/17: still hypertensive in spite of maximum dose Cleviprex.  Continues to exhibit right-sided hemiparesis 5/18: CT brain showing worsening midline shift and cerebral edema.  Still having difficulty with blood pressure control, requiring titration up of oral regimen.  Of note was made DO NOT RESUSCITATE by family on 5/17 Consults:  Neuro IR  Procedures:  ETT 5/16 >> Radial aline 5/16 >>  Significant Diagnostic Tests:   CT head 5/16 >> acute cytotoxic edema in Lt insula and frontal operculum, extensive chronic small vessel ischemic changes  Echo 5/17 >> Left Ventricle: Left ventricular ejection fraction, by estimation, is 55 to 60%. The left ventricle has normal function. The left ventricle has no regional wall motion abnormalities. The left ventricular internal cavity size was normal in size. There is mild concentric left ventricular hypertrophy. Left ventricular diastolic parameters are consistent with Grade II diastolic dysfunction (pseudonormalization). Elevated left ventricular end-diastolic pressure.  CT brain 5/18: small acute infarct in the right parietal cortex, no new hemorrhage, left MCA infarct with progressive cytotoxic edema/swelling in 6 mm midline shift Micro Data:  SARS CoV2 PCR 5/16 >>  negative  Antimicrobials:    Interim history/subjective:  No issues over night   Objective   Blood pressure 119/77, pulse 79, temperature 98.8 F (37.1 C), temperature source Axillary, resp. rate (Abnormal) 27, height 5\' 7"  (1.702 m), weight 77 kg, SpO2 98 %.    Vent Mode: PRVC FiO2 (%):  [40 %] 40 % Set Rate:  [18 bmp] 18 bmp Vt Set:  [530 mL] 530 mL PEEP:  [10 cmH20] 10 cmH20 Plateau Pressure:  [17 cmH20-22 cmH20] 18 cmH20   Intake/Output Summary (Last 24 hours) at 10/05/2019 0925 Last data filed at 10/05/2019 0800 Gross per 24 hour  Intake 1432.04 ml  Output 250 ml  Net 1182.04 ml   Filed Weights   10/02/19 1315 10/04/19 0500 10/05/19 0500  Weight: 77.7 kg 77.4 kg 77 kg    Examination: General this is a 64 year old black male he is currently off propofol infusion HEENT normocephalic atraumatic pupils essentially nonreactive orally intubated he does have gag and cough Pulmonary: Some scattered rhonchi no accessory use Cardiac: Regular rate and rhythm Abdomen: Soft nontender Extremities: Chronic lower extremity edema brisk capillary refill dry, pulses palpable Neuro moving left side spontaneously, not following commands GU clear yellow.   Resolved Hospital Problem list     Assessment & Plan:   Acute hypoxic respiratory failure with compromised airway. Atelectasis  Plan Cont full vent support VAP bundle  F/u sputum cultures  Wean PEEP/Fio2 for sats >93 PAD protocol  Hypertensive emergency. Hx of HLD. Plan Goal sbp <140 Cont labetolol, apresoline, norvasc and catapres Dc cleviprex  Lt ICA occlusion with intraparenchymal bleed along posterior limb of IC as well as Rt sided  bleed. -24-hour follow-up CT brain obtained this morning shows small acute infarct in the right parietal cortex, no new hemorrhage, left MCA infarct with progressive cytotoxic edema/swelling in 6 mm midline shift Plan Cont serial neuro checks Cont on-going goals of care discussion HT  saline on hold given Na 155 F/u MRI today  Cont Lipitor  CKD2. Serum creatinine stable  plan Cont to monitor  Dc ace I as cr rising   Mild hyperchloremic non-anion anion gap metabolic acidosis Plan Cont serial chemistries  Holding free water given cerebral edema   Hyperglycemia Plan Cont current ssi  Best practice:  Diet: NPO-->start tubefeeds /17 DVT prophylaxis: SCDs GI prophylaxis: protonix Mobility: bed rest Code Status: full code Disposition: ICU  Critical care time 32 minutes  Erick Colace ACNP-BC [place] Pager # [phone] OR # [phone] if no answer

## 2019-10-06 LAB — CBC
HCT: 38.6 % — ABNORMAL LOW (ref 39.0–52.0)
Hemoglobin: 12.4 g/dL — ABNORMAL LOW (ref 13.0–17.0)
MCH: 30.7 pg (ref 26.0–34.0)
MCHC: 32.1 g/dL (ref 30.0–36.0)
MCV: 95.5 fL (ref 80.0–100.0)
Platelets: 211 10*3/uL (ref 150–400)
RBC: 4.04 MIL/uL — ABNORMAL LOW (ref 4.22–5.81)
RDW: 16.2 % — ABNORMAL HIGH (ref 11.5–15.5)
WBC: 10.3 10*3/uL (ref 4.0–10.5)
nRBC: 0.2 % (ref 0.0–0.2)

## 2019-10-06 LAB — BASIC METABOLIC PANEL
Anion gap: 10 (ref 5–15)
BUN: 34 mg/dL — ABNORMAL HIGH (ref 8–23)
CO2: 22 mmol/L (ref 22–32)
Calcium: 8.8 mg/dL — ABNORMAL LOW (ref 8.9–10.3)
Chloride: 125 mmol/L — ABNORMAL HIGH (ref 98–111)
Creatinine, Ser: 1.28 mg/dL — ABNORMAL HIGH (ref 0.61–1.24)
GFR calc Af Amer: >60 mL/min (ref >60)
GFR calc non Af Amer: 58 mL/min — ABNORMAL LOW (ref >60)
Glucose, Bld: 154 mg/dL — ABNORMAL HIGH (ref 70–99)
Potassium: 4 mmol/L (ref 3.5–5.1)
Sodium: 157 mmol/L — ABNORMAL HIGH (ref 135–145)

## 2019-10-06 LAB — CULTURE, RESPIRATORY W GRAM STAIN: Culture: NORMAL

## 2019-10-06 LAB — SODIUM
Sodium: 158 mmol/L — ABNORMAL HIGH (ref 135–145)
Sodium: 158 mmol/L — ABNORMAL HIGH (ref 135–145)
Sodium: 159 mmol/L — ABNORMAL HIGH (ref 135–145)

## 2019-10-06 LAB — GLUCOSE, CAPILLARY
Glucose-Capillary: 114 mg/dL — ABNORMAL HIGH (ref 70–99)
Glucose-Capillary: 119 mg/dL — ABNORMAL HIGH (ref 70–99)
Glucose-Capillary: 118 mg/dL — ABNORMAL HIGH (ref 70–99)
Glucose-Capillary: 127 mg/dL — ABNORMAL HIGH (ref 70–99)
Glucose-Capillary: 110 mg/dL — ABNORMAL HIGH (ref 70–99)
Glucose-Capillary: 152 mg/dL — ABNORMAL HIGH (ref 70–99)

## 2019-10-06 LAB — TRIGLYCERIDES: Triglycerides: 181 mg/dL — ABNORMAL HIGH (ref <150)

## 2019-10-06 MED ORDER — DOCUSATE SODIUM 50 MG/5ML PO LIQD: 100.0000 mg | Freq: Two times a day (BID) | ORAL | Status: DC

## 2019-10-06 MED ORDER — POTASSIUM CHLORIDE 20 MEQ PO PACK
40.0000 meq | PACK | Freq: Every day | ORAL | Status: DC
  Administered 2019-10-06: 40 meq via ORAL
  Filled 2019-10-06: qty 2

## 2019-10-06 MED ORDER — FENTANYL 2500MCG IN NS 250ML (10MCG/ML) PREMIX INFUSION
0.0000 ug/h | INTRAVENOUS | Status: DC
  Administered 2019-10-06: 50 ug/h via INTRAVENOUS
  Filled 2019-10-06: qty 250

## 2019-10-06 MED ORDER — LACTATED RINGERS IV SOLN: INTRAVENOUS | Status: DC

## 2019-10-06 MED ORDER — DEXMEDETOMIDINE HCL IN NACL 400 MCG/100ML IV SOLN
0.0000 ug/kg/h | INTRAVENOUS | Status: DC
  Administered 2019-10-06: 0.8 ug/kg/h via INTRAVENOUS
  Administered 2019-10-06: 0.4 ug/kg/h via INTRAVENOUS
  Administered 2019-10-06: 1.1 ug/kg/h via INTRAVENOUS
  Filled 2019-10-06 (×4): qty 100

## 2019-10-06 MED ORDER — POLYETHYLENE GLYCOL 3350 17 G PO PACK: 17.0000 g | PACK | Freq: Every day | ORAL | Status: DC

## 2019-10-06 MED ORDER — POTASSIUM CHLORIDE 20 MEQ PO PACK
40.0000 meq | PACK | Freq: Every day | ORAL | Status: DC
  Administered 2019-10-07 – 2019-10-11 (×5): 40 meq
  Filled 2019-10-06 (×5): qty 2

## 2019-10-06 MED ORDER — SENNA 8.6 MG PO TABS
1.0000 | ORAL_TABLET | Freq: Every day | ORAL | Status: DC
  Administered 2019-10-06 – 2019-10-07 (×2): 8.6 mg
  Filled 2019-10-06 (×2): qty 1

## 2019-10-06 NOTE — Progress Notes (Signed)
PT Cancellation Note  Patient Details Name: Riley Wagner MRN: [id number] DOB: 02-22-1956   Cancelled Treatment:    Reason Eval/Treat Not Completed: Medical issues which prohibited therapy. Per discussion with RN pt remains intubated, unable to wean sedation due to agitation. PT will hold until sedation is weaned and pt is better able to participate in PT evaluation.   Zenaida Niece 10/06/2019, 8:29 AM

## 2019-10-06 NOTE — Progress Notes (Signed)
STROKE TEAM PROGRESS NOTE   INTERVAL HISTORY RN at bedside. Pt still intubated on vent. He failed weaning trial this morning, became agitated and increased WOB, put back on vent. Neuro unchanged, will repeat CT in am.     Vitals:   10/06/19 0500 10/06/19 0600 10/06/19 0730 10/06/19 0800  BP: 140/77 (!) 174/94  (!) 141/88  Pulse: 80 84  72  Resp: (!) 26 (!) 27  (!) 22  Temp:    97.9 F (36.6 C)  TempSrc:    Axillary  SpO2: 98% 98% 99% 97%  Weight: 77 kg     Height:       CBC:  Recent Labs  Lab 10/02/19 0826 10/02/19 0831 10/05/19 0723 10/06/19 0600  WBC 11.1*   < > 11.5* 10.3  NEUTROABS 8.9*  --   --   --   HGB 15.7   < > 11.8* 12.4*  HCT 47.1   < > 36.8* 38.6*  MCV 93.3   < > 96.3 95.5  PLT 288   < > 186 211   < > = values in this interval not displayed.   Basic Metabolic Panel:  Recent Labs  Lab 10/04/19 0245 10/04/19 0812 10/04/19 1700 10/04/19 1734 10/05/19 0723 10/05/19 1200 10/06/19 0000 10/06/19 0600  NA 155*   < >  --    < > 155*   < > 159* 157*  K 3.6  --   --   --  3.5  --   --  4.0  CL 125*  --   --   --  121*  --   --  125*  CO2 20*  --   --   --  22  --   --  22  GLUCOSE 164*  --   --   --  142*  --   --  154*  BUN 15  --   --   --  30*  --   --  34*  CREATININE 1.27*  --   --   --  1.51*  --   --  1.28*  CALCIUM 8.8*  --   --   --  8.7*  --   --  8.8*  MG 2.4  --  2.3  --   --   --   --   --   PHOS 1.6*  --  4.2  --   --   --   --   --    < > = values in this interval not displayed.   Lipid Panel:     Component Value Date/Time   CHOL 176 10/03/2019 0216   CHOL 231 (H) 02/18/2018 1500   TRIG 181 (H) 10/06/2019 0600   HDL 30 (L) 10/03/2019 0216   HDL 41 02/18/2018 1500   CHOLHDL 5.9 10/03/2019 0216   VLDL UNABLE TO CALCULATE IF TRIGLYCERIDE OVER 400 mg/dL 10/03/2019 0216   LDLCALC UNABLE TO CALCULATE IF TRIGLYCERIDE OVER 400 mg/dL 10/03/2019 0216   LDLCALC 153 (H) 02/18/2018 1500   HgbA1c:  Lab Results  Component Value Date   HGBA1C  5.9 (H) 10/03/2019   Urine Drug Screen:     Component Value Date/Time   LABOPIA NONE DETECTED 10/02/2019 1309   COCAINSCRNUR POSITIVE (A) 10/02/2019 1309   LABBENZ NONE DETECTED 10/02/2019 1309   AMPHETMU NONE DETECTED 10/02/2019 1309   THCU POSITIVE (A) 10/02/2019 1309   LABBARB NONE DETECTED 10/02/2019 1309    Alcohol Level No results found for: [facility]  IMAGING past 24 hours MR ANGIO HEAD WO CONTRAST  Result Date: 10/05/2019 CLINICAL DATA:  Stroke follow-up. Left ICA terminus occlusion followed by endovascular revascularization with persistent hemorrhage necessitating sacrifice of the ICA terminus via coil embolization. EXAM: MRI HEAD WITHOUT CONTRAST MRA HEAD WITHOUT CONTRAST TECHNIQUE: Multiplanar, multiecho pulse sequences of the brain and surrounding structures were obtained without intravenous contrast. Angiographic images of the head were obtained using MRA technique without contrast. COMPARISON:  Head CT 10/04/2019 and CTA 10/02/2019 FINDINGS: MRI HEAD FINDINGS Brain: Cytotoxic edema throughout the left MCA territory and associated mass effect have not significantly changed from yesterday's CT with 8 mm of rightward midline shift. Left ACA infarcts are also again seen involving the anteroinferior left frontal lobe and corpus callosum. There are punctate acute right ACA infarcts in the anteromedial right frontal lobe. Small acute right MCA infarcts in the inferior right frontal lobe and posterior right parietal lobe are unchanged from the prior CT with the former being partially obscured by streak artifact on CT. Additional punctate acute infarcts are scattered in the right cerebral hemisphere including in the right caudate nucleus. Left basal ganglia susceptibility artifact corresponds to hemorrhage and extravasated contrast on CT. Scattered small volume subarachnoid hemorrhage is again noted, and there is also vascular susceptibility artifact involving the left MCA. There is a background of  age advanced chronic small vessel ischemia in the cerebral white matter bilaterally with multiple chronic lacunar infarcts. Multiple chronic infarcts are also present in the pons, thalami, basal ganglia, and inferior left cerebellar hemisphere. There is mild cerebral atrophy. No extra-axial fluid collection is identified. Vascular: More fully evaluated below. Skull and upper cervical spine: Unremarkable bone marrow signal. Sinuses/Orbits: Unremarkable orbits. Mucosal thickening and fluid throughout the paranasal sinuses with fluid in the pharynx and mastoid air cells in the setting of intubation. Other: None. MRA HEAD FINDINGS The visualized distal vertebral arteries are patent to the basilar with the right being slightly dominant. There is an unchanged severe distal left V4 stenosis, and there is a 2 mm posteriorly projecting aneurysm immediately distal to this. A patent left PICA, left larger than right AICAs, and bilateral SCAs are visualized. The basilar artery is patent without significant stenosis. There may be diminutive posterior communicating arteries bilaterally. Both PCAs are patent. There is a mild right P1 origin stenosis, and there is atherosclerotic irregularity and narrowing of the P2 and more distal PCA branches bilaterally including a severe distal left P2 stenosis. No flow related enhancement is evident in the intracranial left ICA, left MCA, or left A1 segment following embolization. The intracranial right ICA is patent with atherosclerotic irregularity and multifocal narrowing including a moderate proximal supraclinoid stenosis. There is a 2 mm inferiorly directed outpouching from the right supraclinoid ICA in the posterior communicating region. The right ACA and right MCA are patent with an azygos A2 configuration noted. There are mild proximal right M1 and severe proximal right M2 stenoses. IMPRESSION: 1. Evolving bilateral cerebral infarcts as above including a large left MCA infarct with  unchanged cytotoxic edema and 8 mm of rightward midline shift. 2. Advanced chronic small vessel ischemia with multiple chronic infarcts as above. 3. No evident flow in the intracranial left ICA, left MCA, or left A1 segment following embolization. 4. Advanced intracranial atherosclerosis including moderate right ICA, mild right M1, severe proximal right M2, and severe left V4 stenoses. 5. 2 mm distal left V4 aneurysm. 6. 2 mm right supraclinoid ICA aneurysm versus infundibulum. Electronically Signed   By: Zenia Resides  Jeralyn Ruths M.D.   On: 10/05/2019 12:59   MR BRAIN WO CONTRAST  Result Date: 10/05/2019 CLINICAL DATA:  Stroke follow-up. Left ICA terminus occlusion followed by endovascular revascularization with persistent hemorrhage necessitating sacrifice of the ICA terminus via coil embolization. EXAM: MRI HEAD WITHOUT CONTRAST MRA HEAD WITHOUT CONTRAST TECHNIQUE: Multiplanar, multiecho pulse sequences of the brain and surrounding structures were obtained without intravenous contrast. Angiographic images of the head were obtained using MRA technique without contrast. COMPARISON:  Head CT 10/04/2019 and CTA 10/02/2019 FINDINGS: MRI HEAD FINDINGS Brain: Cytotoxic edema throughout the left MCA territory and associated mass effect have not significantly changed from yesterday's CT with 8 mm of rightward midline shift. Left ACA infarcts are also again seen involving the anteroinferior left frontal lobe and corpus callosum. There are punctate acute right ACA infarcts in the anteromedial right frontal lobe. Small acute right MCA infarcts in the inferior right frontal lobe and posterior right parietal lobe are unchanged from the prior CT with the former being partially obscured by streak artifact on CT. Additional punctate acute infarcts are scattered in the right cerebral hemisphere including in the right caudate nucleus. Left basal ganglia susceptibility artifact corresponds to hemorrhage and extravasated contrast on CT.  Scattered small volume subarachnoid hemorrhage is again noted, and there is also vascular susceptibility artifact involving the left MCA. There is a background of age advanced chronic small vessel ischemia in the cerebral white matter bilaterally with multiple chronic lacunar infarcts. Multiple chronic infarcts are also present in the pons, thalami, basal ganglia, and inferior left cerebellar hemisphere. There is mild cerebral atrophy. No extra-axial fluid collection is identified. Vascular: More fully evaluated below. Skull and upper cervical spine: Unremarkable bone marrow signal. Sinuses/Orbits: Unremarkable orbits. Mucosal thickening and fluid throughout the paranasal sinuses with fluid in the pharynx and mastoid air cells in the setting of intubation. Other: None. MRA HEAD FINDINGS The visualized distal vertebral arteries are patent to the basilar with the right being slightly dominant. There is an unchanged severe distal left V4 stenosis, and there is a 2 mm posteriorly projecting aneurysm immediately distal to this. A patent left PICA, left larger than right AICAs, and bilateral SCAs are visualized. The basilar artery is patent without significant stenosis. There may be diminutive posterior communicating arteries bilaterally. Both PCAs are patent. There is a mild right P1 origin stenosis, and there is atherosclerotic irregularity and narrowing of the P2 and more distal PCA branches bilaterally including a severe distal left P2 stenosis. No flow related enhancement is evident in the intracranial left ICA, left MCA, or left A1 segment following embolization. The intracranial right ICA is patent with atherosclerotic irregularity and multifocal narrowing including a moderate proximal supraclinoid stenosis. There is a 2 mm inferiorly directed outpouching from the right supraclinoid ICA in the posterior communicating region. The right ACA and right MCA are patent with an azygos A2 configuration noted. There are  mild proximal right M1 and severe proximal right M2 stenoses. IMPRESSION: 1. Evolving bilateral cerebral infarcts as above including a large left MCA infarct with unchanged cytotoxic edema and 8 mm of rightward midline shift. 2. Advanced chronic small vessel ischemia with multiple chronic infarcts as above. 3. No evident flow in the intracranial left ICA, left MCA, or left A1 segment following embolization. 4. Advanced intracranial atherosclerosis including moderate right ICA, mild right M1, severe proximal right M2, and severe left V4 stenoses. 5. 2 mm distal left V4 aneurysm. 6. 2 mm right supraclinoid ICA aneurysm versus infundibulum. Electronically Signed  By: Logan Bores M.D.   On: 10/05/2019 12:59    PHYSICAL EXAM    Temp:  [97.9 F (36.6 C)-99.5 F (37.5 C)] 97.9 F (36.6 C) (05/20 0800) Pulse Rate:  [25-86] 72 (05/20 0800) Resp:  [17-28] 22 (05/20 0800) BP: (84-175)/(41-95) 141/88 (05/20 0800) SpO2:  [96 %-100 %] 97 % (05/20 0800) FiO2 (%):  [40 %] 40 % (05/20 0805) Weight:  [77 kg] 77 kg (05/20 0500)  General - Well nourished, well developed, intubated on sedation.  Ophthalmologic - fundi not visualized due to noncooperation.  Cardiovascular - Regular rate and rhythm.  Neuro - intubated on sedation, eyes closed, not following commands. With forced eye opening, eyes in more left gaze preference position, not blinking to visual threat, doll's eyes sluggish but able to roll bilaterally, not tracking, PERRL. Corneal reflex weakly present on the left but absent on the right, gag and cough present. Breathing over the vent.  Facial symmetry not able to test due to ET tube.  Tongue protrusion not cooperative. Spontaneous and purposeful movement of LUE and LLE, but not on the right. On pain stimulation, at least 3/5 LUE and 3-/5 LLE, no movement of RUE and RLE. DTR 1+ and no babinski. Sensation, coordination and gait not tested.   ASSESSMENT/PLAN [name] with  history of HTN who fell the night prior to admission, presenting with right sided weakness and aphasia. Taken to IR for terminus R ICA occlusion.  Stroke:   L MCA infarcts due to left M1 occlusion s/p unsuccessful L MCA thrombectomy - L MCA infarct likely secondary to L ICA large vessel disease.  Stroke:   Right MCA infarcts in setting of cocaine use and right ICA and MCA large vessel stenoses     Code Stroke CT head cytotoxic edema L insula and frontal operculum. Likely old L parietal cortical and subcortical abnormality. ASPECTS 8    CTA head & neck L ICA occlusion. Widespread atherosclerosis: B CCA to ICA bifurcation R 40%, L 50% w/ extensive soft plaques at bifurcation and bulbs, L 30-40%. Severe B ICA siphon stenoses. R MCA and B PCA atherosclerosis w/ narrowing & irregularity.  CT perfusion 105 penumbra L MCA territory.   Cerebral angio diffuse and severe intracranial atherosclerosis. Proximal L M1 occlusion. Complete recanalization L M1 w/ embotrap w/ poor distal flow d/t severe L ICA stenosis s/p angioplasty. L ICA terminus coil embolization w/ sparing L anterior choroidal.  CT head repeat 5/16 large L basal ganglia hemorrhage/contrast posterior SDH and SAH. Large cytotoxic edema L MCA w/ 69mm midline shift.   CT head 5/17 diminishing SAH contrast. L MCA infarct w/o significant mass effect  CT head 5/18 diminishing SAH. L MCA infarct increased edema, MLS 1mm. Right parietal small new infarct.   MRI evolving B cerebral infarcts including large L MCA infarct w/ edema and 57mm R midline shift, right parietal and frontal infarcts. No significant hemorrhage.  MRA No flow L carotid systeme following thrombectomy. Advanced intracranial atherosclerosis including moderate R ICA, mild R M1, severe proximal R M2 and severe L V4 stenoses.    CT repeat in am  2D Echo EF 55-60%  TG 471 and direct LDL 95.3, goal < 70  HgbA1c 5.9   UDS + cocaine and THC  lovenox for VTE prophylaxis  No  antithrombotic prior to admission, now on ASA 325mg  daily.  Therapy recommendations:  pending   Disposition:  pending   Code status - DNR. Family  likely to make decision about trach vs. Comfort care in a few days  Cerebral Edema  Present on admission scan  Repeat CT head 5/17 large left MCA infarct with edema but no significant midline shift  CT 5/18 repeat L MCA infarct increased edema, MLS 48mm. Right parietal small new infarct.  MRI 5/19 - large L MCA infarct with MLS 98mm  Off 3% now  Goal Na 150-155  Na 141-145-148->152->156->155->154->154->155->152->159->157  23.4% saline x 1    Acute hypoxic respiratory failure with compromised airway Atelectasis   Intubated for IR, continued d/t compromised airway  CCM on board  On vent  On propofol   CXR 5/18 - Persistent left basilar process, atelectasis versus infiltrate  Hypertensive Emergency  SBP > 220 on arrival  Home meds:  Amlodipine 10, coreg 3.125 bid, HCTZ 25, lisinopril 40  Off cleviprex now  On amlodipine 10, hydralazine 50 q8, labetalol 200 Q8  BP goal < 160 now  BP stable . Long-term BP goal normotensive  Hyperlipidemia  Home meds:  lipitor 40, resumed in hospital  TG 471 and TC 176, direct LDL 95.3, goal < 70  On lipitor 40  Continue statin at discharge  Dysphagia . Secondary to stroke . NPO . On tube feedings @ 40 and LR @ 50 . Speech on board  Cocaine abuse  UDS positive for cocaine  Cessation education will be provided   Other Stroke Risk Factors  Advanced age  Former Cigarette smoker, quit 5 yrs ago  ETOH use, advised to drink no more than 2 drink(s) a day  THC abuse - UDS:  THC POSITIVE, cessation education will be provided.  Family hx stroke (mother)  Coronary artery disease, MI  Other Active Problems  CKD stage II Cre 1.37->1.27->1.51->1.28  TG elevated at 476->471->106->88->181  Hospital day # 4  Patient continues to be critically ill for the last 24  hours, has continued cerebral edema needing Na monitoring and 23.4% saline if needed and repeat CT in am, failed weaning trial and back to vent, continued vital monitoring for BP management. I discussed with CCM Dr. Lynetta Mare. This patient is critically ill due to left MCA occlusion s/p EVT, cerebral edema, respiratory failure, cocaine abuse and at significant risk of neurological worsening, death form recurrent stroke, hematoma expansion, heart failure, seizure, respiratory failure, brain herniation. This patient's care requires constant monitoring of vital signs, hemodynamics, respiratory and cardiac monitoring, review of multiple databases, neurological assessment, discussion with family, other specialists and medical decision making of high complexity. I spent 30 minutes of neurocritical care time in the care of this patient.   Rosalin Hawking, MD PhD Stroke Neurology 10/06/2019 8:51 AM  To contact Stroke Continuity provider, please refer to [email or web address] After hours, contact General Neurology

## 2019-10-06 NOTE — Progress Notes (Signed)
OT Cancellation Note  Patient Details Name: Riley Wagner MRN: [id number] DOB: 26-Jun-1955   Cancelled Treatment:    Reason Eval/Treat Not Completed: Patient not medically ready(Intubated. Sedated. )  [place], OT/L   Acute OT Clinical Specialist Acute Rehabilitation Services Pager [phone] Office [phone]  10/06/2019, 9:42 AM

## 2019-10-06 NOTE — Progress Notes (Signed)
NAME:  Riley Wagner, MRN:  [id number], DOB:  27-Sep-1955, LOS: 4 ADMISSION DATE:  10/02/2019, CONSULTATION DATE:  10/02/2019 REFERRING MD:  Dr. Debbrah Alar, neuro IR, CHIEF COMPLAINT:  Altered mental status   Brief History   64 yo male former smoker brought to ER with hypertensive crisis (BP 212/110), Rt sided weakness and aphasia.  Found to have Lt terminal ICA occlusion.  Intubated for airway protection.  Noted to have intraparenchymal bleed along posterior limb of IC as well as Rt sided bleed.  Had embolization of Lt ICA terminus.  Past Medical History  HTN, [place] [facility] Events   5/16 Admit. To IR for embolization of Left ICA. Back to ICU intubated. BP goal 120-140 5/17: still hypertensive in spite of maximum dose Cleviprex.  Continues to exhibit right-sided hemiparesis 5/18: CT brain showing worsening midline shift and cerebral edema.  Still having difficulty with blood pressure control, requiring titration up of oral regimen.  Of note was made DO NOT RESUSCITATE by family on 5/17 Consults:  Neuro IR  Procedures:  ETT 5/16 >> Radial aline 5/16 >>  Significant Diagnostic Tests:   CT head 5/16 >> acute cytotoxic edema in Lt insula and frontal operculum, extensive chronic small vessel ischemic changes  Echo 5/17 >> Left Ventricle: Left ventricular ejection fraction, by estimation, is 55 to 60%. The left ventricle has normal function. The left ventricle has no regional wall motion abnormalities. The left ventricular internal cavity size was normal in size. There is mild concentric left ventricular hypertrophy. Left ventricular diastolic parameters are consistent with Grade II diastolic dysfunction (pseudonormalization). Elevated left ventricular end-diastolic pressure.  CT brain 5/18: small acute infarct in the right parietal cortex, no new hemorrhage, left MCA infarct with progressive cytotoxic edema/swelling in 6 mm midline shift Micro Data:  SARS CoV2 PCR 5/16 >>  negative Respiratory culture 5/18: nml flora  Antimicrobials:    Interim history/subjective:  BP better. No acute issues over night   Objective   Blood pressure (Abnormal) 141/88, pulse 72, temperature 97.9 F (36.6 C), temperature source Axillary, resp. rate (Abnormal) 22, height 5\' 7"  (1.702 m), weight 77 kg, SpO2 97 %.    Vent Mode: PRVC FiO2 (%):  [40 %] 40 % Set Rate:  [18 bmp] 18 bmp Vt Set:  [530 mL] 530 mL PEEP:  [8 cmH20-10 cmH20] 8 cmH20 Pressure Support:  [5 cmH20] 5 cmH20 Plateau Pressure:  [20 cmH20-21 cmH20] 20 cmH20   Intake/Output Summary (Last 24 hours) at 10/06/2019 0903 Last data filed at 10/06/2019 0800 Gross per 24 hour  Intake 1463.96 ml  Output 825 ml  Net 638.96 ml   Filed Weights   10/04/19 0500 10/05/19 0500 10/06/19 0500  Weight: 77.4 kg 77 kg 77 kg    Examination: General this is a 64 year old male. He remains on full vent support. Agitated at times appears restless when sedation decreased  HENT NCAT copious oral secretions. Orally intubated. Pupils equal sclera not icteric Pulm coarse scattered rhonchi, equal chest rise. No accessory use  Card RRR abd not tender Neuro opens eyes to stimulus. + cough + gag, not able to control oral secretions. spont moves left upper/lower 5/5 right UE :0 LLE: 1  Ext warm and dry no sig edema gu cl yellow    Resolved Hospital Problem list   non-anion anion gap metabolic acidosis  Assessment & Plan:   Acute hypoxic respiratory failure with compromised airway. Atelectasis  Plan Continue full ventilator support Pulse oximetry Wean PEEP/FiO2  for saturations greater than 92% VAP bundle  Hypertensive emergency. Hx of HLD. Plan Goal systolic blood pressure less than 140  We will continue labetalol, Apresoline, Norvasc  Lt ICA occlusion with intraparenchymal bleed along posterior limb of IC as well as Rt sided bleed. MRI brain showed evolving bilateral cerebral infarcts, large left MCA infarct, unchanged  cytotoxic edema with 8 mm of rightward midline shift.  There is negative flow in the intracranial left ICA left MCA or left A1 segment -Prognosis for meaningful functional recovery poor Plan Continue serial neuro checks  Ongoing goals of care  Continue statin Daily aspirin 3% saline discontinued, did get one-time bolus of 23.4% saline on 5/19, goal sodium remains 150-155 Changing to precedex  CKD2. Serum creatinine stable  plan Avoid nephrotoxins Serial chemistries  Therapeutic hypernatremia, mild hyperchloremic  Plan Holding free water given cerebral edema Serial chemistries  Hyperglycemia Plan Continue sliding scale insulin  Best practice:  Diet: NPO-->start tubefeeds /17 DVT prophylaxis: SCDs & LMWH GI prophylaxis: protonix Mobility: bed rest Code Status: full code Disposition: ICU  cct 32 min    Erick Colace ACNP-BC [place] Pager # [phone] OR # [phone] if no answer

## 2019-10-07 ENCOUNTER — Inpatient Hospital Stay (HOSPITAL_COMMUNITY): Payer: Medicare Other

## 2019-10-07 DIAGNOSIS — R509 Fever, unspecified: Secondary | ICD-10-CM

## 2019-10-07 DIAGNOSIS — R1312 Dysphagia, oropharyngeal phase: Secondary | ICD-10-CM

## 2019-10-07 LAB — CBC
HCT: 36.8 % — ABNORMAL LOW (ref 39.0–52.0)
Hemoglobin: 11.5 g/dL — ABNORMAL LOW (ref 13.0–17.0)
MCH: 30.8 pg (ref 26.0–34.0)
MCHC: 31.3 g/dL (ref 30.0–36.0)
MCV: 98.7 fL (ref 80.0–100.0)
Platelets: 211 10*3/uL (ref 150–400)
RBC: 3.73 MIL/uL — ABNORMAL LOW (ref 4.22–5.81)
RDW: 16.4 % — ABNORMAL HIGH (ref 11.5–15.5)
WBC: 8.8 10*3/uL (ref 4.0–10.5)
nRBC: 0.3 % — ABNORMAL HIGH (ref 0.0–0.2)

## 2019-10-07 LAB — BASIC METABOLIC PANEL
Anion gap: 7 (ref 5–15)
BUN: 34 mg/dL — ABNORMAL HIGH (ref 8–23)
CO2: 24 mmol/L (ref 22–32)
Calcium: 8.6 mg/dL — ABNORMAL LOW (ref 8.9–10.3)
Chloride: 127 mmol/L — ABNORMAL HIGH (ref 98–111)
Creatinine, Ser: 1.36 mg/dL — ABNORMAL HIGH (ref 0.61–1.24)
GFR calc Af Amer: >60 mL/min (ref >60)
GFR calc non Af Amer: 54 mL/min — ABNORMAL LOW (ref >60)
Glucose, Bld: 154 mg/dL — ABNORMAL HIGH (ref 70–99)
Potassium: 4 mmol/L (ref 3.5–5.1)
Sodium: 158 mmol/L — ABNORMAL HIGH (ref 135–145)

## 2019-10-07 LAB — GLUCOSE, CAPILLARY
Glucose-Capillary: 129 mg/dL — ABNORMAL HIGH (ref 70–99)
Glucose-Capillary: 121 mg/dL — ABNORMAL HIGH (ref 70–99)
Glucose-Capillary: 104 mg/dL — ABNORMAL HIGH (ref 70–99)
Glucose-Capillary: 121 mg/dL — ABNORMAL HIGH (ref 70–99)
Glucose-Capillary: 167 mg/dL — ABNORMAL HIGH (ref 70–99)
Glucose-Capillary: 145 mg/dL — ABNORMAL HIGH (ref 70–99)

## 2019-10-07 LAB — SODIUM
Sodium: 158 mmol/L — ABNORMAL HIGH (ref 135–145)
Sodium: 160 mmol/L — ABNORMAL HIGH (ref 135–145)
Sodium: 156 mmol/L — ABNORMAL HIGH (ref 135–145)

## 2019-10-07 MED ORDER — CIPROFLOXACIN HCL 0.3 % OP SOLN
1.0000 [drp] | Freq: Four times a day (QID) | OPHTHALMIC | Status: AC
  Administered 2019-10-07 – 2019-10-12 (×19): 1 [drp] via OPHTHALMIC
  Filled 2019-10-07: qty 2.5

## 2019-10-07 MED ORDER — PRO-STAT SUGAR FREE PO LIQD
30.0000 mL | Freq: Two times a day (BID) | ORAL | Status: DC
  Administered 2019-10-07 – 2019-10-26 (×38): 30 mL
  Filled 2019-10-07 (×38): qty 30

## 2019-10-07 MED ORDER — SODIUM CHLORIDE 3 % IN NEBU
4.0000 mL | INHALATION_SOLUTION | RESPIRATORY_TRACT | Status: DC
  Filled 2019-10-07: qty 4

## 2019-10-07 MED ORDER — LISINOPRIL 20 MG PO TABS
40.0000 mg | ORAL_TABLET | Freq: Every day | ORAL | Status: DC
  Administered 2019-10-07 – 2019-10-15 (×8): 40 mg
  Filled 2019-10-07 (×8): qty 2

## 2019-10-07 MED ORDER — ASPIRIN 325 MG PO TABS: 325.0000 mg | ORAL_TABLET | Freq: Every day | ORAL | Status: DC

## 2019-10-07 MED ORDER — HYDRALAZINE HCL 50 MG PO TABS
100.0000 mg | ORAL_TABLET | Freq: Three times a day (TID) | ORAL | Status: DC
  Administered 2019-10-07 – 2019-10-15 (×21): 100 mg
  Filled 2019-10-07 (×23): qty 2

## 2019-10-07 MED ORDER — ASPIRIN 300 MG RE SUPP: 300.0000 mg | Freq: Every day | RECTAL | Status: DC

## 2019-10-07 MED ORDER — OSMOLITE 1.5 CAL PO LIQD
1000.0000 mL | ORAL | Status: DC
  Administered 2019-10-07 – 2019-10-19 (×10): 1000 mL
  Filled 2019-10-07 (×19): qty 1000

## 2019-10-07 MED ORDER — LABETALOL HCL 5 MG/ML IV SOLN
10.0000 mg | INTRAVENOUS | Status: DC | PRN
  Administered 2019-10-07 (×2): 20 mg via INTRAVENOUS
  Administered 2019-10-07: 10 mg via INTRAVENOUS
  Administered 2019-10-08 – 2019-10-10 (×4): 20 mg via INTRAVENOUS
  Filled 2019-10-07 (×4): qty 4

## 2019-10-07 MED ORDER — LABETALOL HCL 100 MG PO TABS
300.0000 mg | ORAL_TABLET | Freq: Three times a day (TID) | ORAL | Status: DC
  Administered 2019-10-07 – 2019-10-15 (×16): 300 mg
  Filled 2019-10-07 (×19): qty 3

## 2019-10-07 NOTE — Progress Notes (Signed)
Occupational Therapy Discharge Patient Details Name: Riley Wagner MRN: [id number] DOB: 05/09/1956 Today's Date: 10/07/2019 Time:  -     Patient discharged from OT services secondary to pending extubation with concerns for reintubation. RN asking to sign off currently as therapy has attempted patient 5 days in a row. Pt is not medically ready for therapy to start per staff so will need reorder. .  Please see latest therapy progress note for current level of functioning and progress toward goals.    Progress and discharge plan discussed with patient and/or caregiver: Patient unable to participate in discharge planning and no caregivers available  GO     Billey Chang, OTR/L  Acute Rehabilitation Services Pager: [phone] Office: [phone] .  10/07/2019, 10:34 AM

## 2019-10-07 NOTE — Progress Notes (Signed)
NAME:  Riley Wagner, MRN:  [id number], DOB:  1956-01-13, LOS: 5 ADMISSION DATE:  10/02/2019, CONSULTATION DATE:  10/02/2019 REFERRING MD:  Dr. Debbrah Alar, neuro IR, CHIEF COMPLAINT:  Altered mental status   Brief History   64 yo male former smoker brought to ER with hypertensive crisis (BP 212/110), Rt sided weakness and aphasia.  Found to have Lt terminal ICA occlusion.  Intubated for airway protection.  Noted to have intraparenchymal bleed along posterior limb of IC as well as Rt sided bleed.  Had embolization of Lt ICA terminus.  Past Medical History  HTN, [place] [facility] Events   5/16 Admit. To IR for embolization of Left ICA. Back to ICU intubated. BP goal 120-140 5/17: still hypertensive in spite of maximum dose Cleviprex.  Continues to exhibit right-sided hemiparesis 5/18: CT brain showing worsening midline shift and cerebral edema.  Still having difficulty with blood pressure control, requiring titration up of oral regimen.  Of note was made DO NOT RESUSCITATE by family on 5/17 5/20 bp under control. Moving all ext except RUE. Spoke w/ family; leaning towards attempt at extubation and trach if fails.  Consults:  Neuro IR  Procedures:  ETT 5/16 >> Radial aline 5/16 >>  Significant Diagnostic Tests:   CT head 5/16 >> acute cytotoxic edema in Lt insula and frontal operculum, extensive chronic small vessel ischemic changes  Echo 5/17 >> Left Ventricle: Left ventricular ejection fraction, by estimation, is 55 to 60%. The left ventricle has normal function. The left ventricle has no regional wall motion abnormalities. The left ventricular internal cavity size was normal in size. There is mild concentric left ventricular hypertrophy. Left ventricular diastolic parameters are consistent with Grade II diastolic dysfunction (pseudonormalization). Elevated left ventricular end-diastolic pressure.  CT brain 5/18: small acute infarct in the right parietal cortex, no new hemorrhage,  left MCA infarct with progressive cytotoxic edema/swelling in 6 mm midline shift CT brain 5/21: 1. Left more than right cerebral infarcts without progression from brain MRI 2 days ago. Cytotoxic edema causes 1 cm of rightward shift.2. No interval hemorrhage. Micro Data:  SARS CoV2 PCR 5/16 >> negative Respiratory culture 5/18: nml flora  Antimicrobials:    Interim history/subjective:  Apnea during weaning attempt, now w/ good Vt on PSV of 5  Objective   Blood pressure 120/71, pulse (Abnormal) 47, temperature 99 F (37.2 C), resp. rate 18, height 5\' 7"  (1.702 m), weight 81.7 kg, SpO2 97 %.    Vent Mode: PRVC FiO2 (%):  [40 %] 40 % Set Rate:  [18 bmp] 18 bmp Vt Set:  [530 mL] 530 mL PEEP:  [5 cmH20-8 cmH20] 5 cmH20 Plateau Pressure:  [17 cmH20-20 cmH20] 19 cmH20   Intake/Output Summary (Last 24 hours) at 10/07/2019 0809 Last data filed at 10/07/2019 0800 Gross per 24 hour  Intake 2618.61 ml  Output 800 ml  Net 1818.61 ml   Filed Weights   10/05/19 0500 10/06/19 0500 10/07/19 0500  Weight: 77 kg 77 kg 81.7 kg    Examination: General this is a 64 year old black male, Now on PSV. Awake, not able to follow commands but is purposeful  HENT right sided droop. Poor control of oral secretions. PERRL orally intubated Pulm scattered rhonchi, decreased Right base. Currently on PSV 5/peep 5: Vts 600s -700s, rate 20s some accessory use after cough BUT improves  Card RRR w/ systolic murmur abd not tender Ext trace LE edema brisk CR pulses are palp Neuro eyes open spont right UE  paresis. RLE 1/5/ left upper and lower w/ 5/5 strength    Resolved Hospital Problem list   non-anion anion gap metabolic acidosis  Assessment & Plan:   Acute hypoxic respiratory failure with compromised airway. Atelectasis  Vent mechanics acceptable on SBT pcxr low volume bilateral atx  Plan Cont SBT and assess for trial of extubations His ability to protect airway likely compromised BUT per family  discussion yesterday we will likely trial extubation today to see how he does. If fails sounds as though family leaning towards trach/peg   Hypertensive emergency. Hx of HLD. Plan Goal bp <160 Cont labetolol, hydralazine and norvasc  Lt ICA occlusion with intraparenchymal bleed along posterior limb of IC as well as Rt sided bleed. MRI brain showed evolving bilateral cerebral infarcts, large left MCA infarct, unchanged cytotoxic edema with 8 mm of rightward midline shift.  There is negative flow in the intracranial left ICA left MCA or left A1 segment -Prognosis for meaningful functional recovery poor Plan Cont serial neuro checks Cont statin Cont asa Goal Na 150-155 Cont precedex Stop fent gtt  CKD2. Serum creatinine stable plan Trend chemistry   Therapeutic hypernatremia, mild hyperchloremic  Plan Holding free water given HT protocol Am chemistry   Hyperglycemia Plan Cont ssi   Best practice:  Diet: NPO-->start tubefeeds /17 DVT prophylaxis: SCDs & LMWH GI prophylaxis: protonix Mobility: bed rest Code Status: full code Disposition: ICU  cct 32 min    Erick Colace ACNP-BC [place] Pager # [phone] OR # [phone] if no answer

## 2019-10-07 NOTE — Progress Notes (Signed)
Mechanics look good Plan Extubate Have glide scope at the ready The issue will be if he can adequately protect airway   Erick Colace ACNP-BC [place] Pager # [phone] OR # [phone] if no answer

## 2019-10-07 NOTE — Procedures (Signed)
Extubation Procedure Note  Patient Details:   Name: Medard Bero DOB: [birth year] MRN: [id number]   Airway Documentation:    Vent end date: 10/07/19 Vent end time: 0935   Evaluation  O2 sats: stable throughout Complications: No apparent complications Patient did tolerate procedure well. Bilateral Breath Sounds: Clear   No   RT extubated patient to 4L [place] per MD order. No cuff leak noted- but continue with extubation per Pete,NP. Patient tolerated well and no stridor noted at this time. RT and RN will continue to monitor.   Vernona Rieger 10/07/2019, 9:52 AM

## 2019-10-07 NOTE — Plan of Care (Signed)
  Problem: Nutrition: Goal: Risk of aspiration will decrease Outcome: Not Progressing   

## 2019-10-07 NOTE — Procedures (Signed)
Cortrak  Person Inserting Tube:  Esaw Dace, RD Tube Type:  Cortrak - 43 inches Tube Location:  Right nare Initial Placement:  Stomach Secured by: Bridle Technique Used to Measure Tube Placement:  Documented cm marking at nare/ corner of mouth Cortrak Secured At:  61 cm    Cortrak Tube Team Note:  Consult received to place a Cortrak feeding tube.   No x-ray is required. RN may begin using tube.   If the tube becomes dislodged please keep the tube and contact the Cortrak team at [email or web address] ([email or web address]) for replacement.  If after hours and replacement cannot be delayed, place a NG tube and confirm placement with an abdominal x-ray.    Kerman Passey MS, RDN, LDN, CNSC RD Pager Number and RD On-Call Pager Number Located in [place]

## 2019-10-07 NOTE — Progress Notes (Signed)
Nutrition Follow-up  DOCUMENTATION CODES:   Not applicable  INTERVENTION:   Tube feeding via Cortrak tube: Osmolite 1.5 at 50 ml/h (1200 ml per day) Pro-stat 30 ml BID MVI daily  Provides 2000 kcal, 105 gm protein, 912 ml free water daily   NUTRITION DIAGNOSIS:   Inadequate oral intake related to inability to eat as evidenced by NPO status. Ongoing.   GOAL:   Patient will meet greater than or equal to 90% of their needs Meeting with TF  MONITOR:   TF tolerance, Labs  REASON FOR ASSESSMENT:   Consult, Ventilator Enteral/tube feeding initiation and management  ASSESSMENT:   Pt with PMH of HTN, HLD, and former smoker now admitted with L MCA infarct due to hypertensive crisis. Pt s/p emergent thrombectomy of L MCA M1 occlusion, balloon angioplasty of R ICA stenosis followed by coil embolization of R ICA secondary to persistent bleeding.   Pt discussed during ICU rounds and with RN.   5/21 extubated; cortrak tube placed   Medications reviewed and include: colace, MVI, miralax, senokot Labs reviewed: Na 160 (H) CBG's: 121-104     Diet Order:   Diet Order            Diet NPO time specified  Diet effective now              EDUCATION NEEDS:   No education needs have been identified at this time  Skin:  Skin Assessment: Reviewed RN Assessment  Last BM:  unknown  Height:   Ht Readings from Last 1 Encounters:  10/02/19 5\' 7"  (1.702 m)    Weight:   Wt Readings from Last 1 Encounters:  10/07/19 81.7 kg    Ideal Body Weight:  67.2 kg  BMI:  Body mass index is 28.21 kg/m.  Estimated Nutritional Needs:   Kcal:  1900-2100  Protein:  100-120 grams  Fluid:  > 1.9 L/day  Lockie Pares., RD, LDN, CNSC See AMiON for contact information

## 2019-10-07 NOTE — Progress Notes (Signed)
PT Cancellation Note  Patient Details Name: Riley Wagner MRN: [id number] DOB: 09-23-1955   Cancelled Treatment:    Reason Eval/Treat Not Completed: Medical issues which prohibited therapy. Per discussion with RN patient remains unable to follow commands, plans to trial extubation today. PT with multiple cancellations due to medical status this week. Acute PT services will sign off at this time. Please re-consult if patient becomes more medically stable and is better able to participate in skilled PT intervention.   Zenaida Niece 10/07/2019, 10:35 AM

## 2019-10-07 NOTE — Progress Notes (Signed)
STROKE TEAM PROGRESS NOTE   INTERVAL HISTORY RN at bedside. I also talked with daughters and son outside at waiting room. Pt was extubated this am, he is sitting in bed, able to open eyes on voice and track on the left side, but nonverbal, not following commands. Purposeful move left UE and LE, but not right arm or leg. Pt has very weak cough and not able to clear secretions, will put on 3% neb, chest PT and NT suctioning. BP elevated, will increase BP meds dose.    Vitals:   10/07/19 0800 10/07/19 0838 10/07/19 0900 10/07/19 0935  BP: 129/71  133/76   Pulse: (!) 47  63   Resp: 18  15   Temp: 99 F (37.2 C)     TempSrc: Axillary     SpO2: 97% 95% 95% 99%  Weight:      Height:       CBC:  Recent Labs  Lab 10/02/19 0826 10/02/19 0831 10/06/19 0600 10/07/19 0709  WBC 11.1*   < > 10.3 8.8  NEUTROABS 8.9*  --   --   --   HGB 15.7   < > 12.4* 11.5*  HCT 47.1   < > 38.6* 36.8*  MCV 93.3   < > 95.5 98.7  PLT 288   < > 211 211   < > = values in this interval not displayed.   Basic Metabolic Panel:  Recent Labs  Lab 10/04/19 0245 10/04/19 0812 10/04/19 1700 10/04/19 1734 10/06/19 0600 10/06/19 1145 10/07/19 0000 10/07/19 0709  NA 155*   < >  --    < > 157*   < > 158* 158*  K 3.6  --   --    < > 4.0  --   --  4.0  CL 125*  --   --    < > 125*  --   --  127*  CO2 20*  --   --    < > 22  --   --  24  GLUCOSE 164*  --   --    < > 154*  --   --  154*  BUN 15  --   --    < > 34*  --   --  34*  CREATININE 1.27*  --   --    < > 1.28*  --   --  1.36*  CALCIUM 8.8*  --   --    < > 8.8*  --   --  8.6*  MG 2.4  --  2.3  --   --   --   --   --   PHOS 1.6*  --  4.2  --   --   --   --   --    < > = values in this interval not displayed.   Lipid Panel:     Component Value Date/Time   CHOL 176 10/03/2019 0216   CHOL 231 (H) 02/18/2018 1500   TRIG 181 (H) 10/06/2019 0600   HDL 30 (L) 10/03/2019 0216   HDL 41 02/18/2018 1500   CHOLHDL 5.9 10/03/2019 0216   VLDL UNABLE TO CALCULATE  IF TRIGLYCERIDE OVER 400 mg/dL 10/03/2019 0216   LDLCALC UNABLE TO CALCULATE IF TRIGLYCERIDE OVER 400 mg/dL 10/03/2019 0216   LDLCALC 153 (H) 02/18/2018 1500   HgbA1c:  Lab Results  Component Value Date   HGBA1C 5.9 (H) 10/03/2019   Urine Drug Screen:     Component Value Date/Time  LABOPIA NONE DETECTED 10/02/2019 1309   COCAINSCRNUR POSITIVE (A) 10/02/2019 1309   LABBENZ NONE DETECTED 10/02/2019 1309   AMPHETMU NONE DETECTED 10/02/2019 1309   THCU POSITIVE (A) 10/02/2019 1309   LABBARB NONE DETECTED 10/02/2019 1309    Alcohol Level No results found for: ETH  IMAGING past 24 hours CT HEAD WO CONTRAST  Result Date: 10/07/2019 CLINICAL DATA:  Stroke follow-up EXAM: CT HEAD WITHOUT CONTRAST TECHNIQUE: Contiguous axial images were obtained from the base of the skull through the vertex without intravenous contrast. COMPARISON:  Brain MRI from 2 days ago FINDINGS: Brain: Contrast staining at the left basal ganglia is unchanged, with superimposed embolic material in the distal ICA and proximal branches with streak artifact. Dense cytotoxic edema in the left MCA territory extending into left ACA branch territory at the inferior frontal lobe. Smaller acute infarcts seen at the inferior right frontal and right parietal cortex. Chronic small vessel ischemia with chronic lacunar infarcts and gliosis in the deep brain. Midline shift measures 10 mm today, increased from prior head CT. No convincing entrapment. Vascular: Embolic material on the left as previously described Skull: No acute finding. Sinuses/Orbits: Intubation with generalized sinus opacification that is similar to prior. IMPRESSION: 1. Left more than right cerebral infarcts without progression from brain MRI 2 days ago. Cytotoxic edema causes 1 cm of rightward shift. 2. No interval hemorrhage. Electronically Signed   By: Monte Fantasia M.D.   On: 10/07/2019 04:54   DG Chest Port 1 View  Result Date: 10/07/2019 CLINICAL DATA:   Atelectasis. EXAM: PORTABLE CHEST 1 VIEW COMPARISON:  10/04/2019 FINDINGS: Endotracheal tube is in place, tip approximately 3.9 centimeters above the carina. LEFT-sided PICC line tip overlies the LOWER superior vena cava. Nasogastric tube is in place, tip overlying the level of the stomach. Heart is enlarged and accentuated by technique. Perihilar venous enlargement. Dense opacity at the LEFT lung base has increased, consistent atelectasis or early consolidation. Shallow lung inflation. IMPRESSION: 1. Increased atelectasis or consolidation at the LEFT lung base. 2. Pulmonary vascular congestion. Electronically Signed   By: Nolon Nations M.D.   On: 10/07/2019 08:58    PHYSICAL EXAM  Temp:  [98.5 F (36.9 C)-100 F (37.8 C)] 99 F (37.2 C) (05/21 0800) Pulse Rate:  [47-71] 63 (05/21 0900) Resp:  [15-23] 15 (05/21 0900) BP: (100-144)/(64-81) 133/76 (05/21 0900) SpO2:  [95 %-100 %] 99 % (05/21 0935) FiO2 (%):  [40 %] 40 % (05/21 0838) Weight:  [81.7 kg] 81.7 kg (05/21 0500)  General - Well nourished, well developed, extubated, but very lethargic. Left eye yellow purulent secretions with conjunctiva redness.  Ophthalmologic - fundi not visualized due to noncooperation.  Cardiovascular - Regular rate and rhythm.  Neuro - extubated but very lethargic, eyes closed, but able to open with voice, not following commands. With eye opening, eyes in more left gaze preference position, able to track objects and sound on the left visual field, right neglect, right hemianopia, PERRL. Right facial droop, tongue protrusion not cooperative. Spontaneous and purposeful movement of LUE 4-/5 and LLE 3-/5, but no movement of RUE and RLE. DTR 1+ and no babinski. Sensation, coordination not cooperative and gait not tested.   ASSESSMENT/PLAN Mr. Slayde Dofflemyer is a 64 y.o. male with history of HTN who fell the night prior to admission, presenting with right sided weakness and aphasia. Taken to IR for terminus R ICA  occlusion.  Stroke:   L MCA infarcts due to left M1 occlusion s/p unsuccessful L MCA  thrombectomy - L MCA infarct likely secondary to L ICA large vessel disease.  Stroke:   Right MCA infarcts in setting of cocaine use and right ICA and MCA large vessel stenoses     Code Stroke CT head cytotoxic edema L insula and frontal operculum. Likely old L parietal cortical and subcortical abnormality. ASPECTS 8    CTA head & neck L ICA occlusion. Widespread atherosclerosis: B CCA to ICA bifurcation R 40%, L 50% w/ extensive soft plaques at bifurcation and bulbs, L 30-40%. Severe B ICA siphon stenoses. R MCA and B PCA atherosclerosis w/ narrowing & irregularity.  CT perfusion 105 penumbra L MCA territory.   Cerebral angio diffuse and severe intracranial atherosclerosis. Proximal L M1 occlusion. Complete recanalization L M1 w/ embotrap w/ poor distal flow d/t severe L ICA stenosis s/p angioplasty. L ICA terminus coil embolization w/ sparing L anterior choroidal.  CT head repeat 5/16 large L basal ganglia hemorrhage/contrast posterior SDH and SAH. Large cytotoxic edema L MCA w/ 67mm midline shift.   CT head 5/17 diminishing SAH contrast. L MCA infarct w/o significant mass effect  CT head 5/18 diminishing SAH. L MCA infarct increased edema, MLS 32mm. Right parietal small new infarct.   MRI evolving B cerebral infarcts including large L MCA infarct w/ edema and 36mm R midline shift, right parietal and frontal infarcts. No significant hemorrhage.  MRA No flow L carotid systeme following thrombectomy. Advanced intracranial atherosclerosis including moderate R ICA, mild R M1, severe proximal R M2 and severe L V4 stenoses.    CT repeat 5/21 L>R infarct w/o progression. 1cm R midline shift. No hemorrhage   2D Echo EF 55-60%  TG 471 and direct LDL 95.3, goal < 70  HgbA1c 5.9   UDS + cocaine and THC  lovenox for VTE prophylaxis  No antithrombotic prior to admission, now on ASA 325mg  daily.  Therapy  recommendations:  pending   Disposition:  pending   Code status - full code now  Cerebral Edema  Present on admission scan  Repeat CT head 5/17 large left MCA infarct with edema but no significant midline shift  CT 5/18 repeat L MCA infarct increased edema, MLS 22mm. Right parietal small new infarct.  MRI 5/19 - large L MCA infarct with MLS 26mm  CT 5/21 - stable b/l infarct with MLS 1cm  Off 3% now  Goal Na 150-155  Na 141-145-148->152->156->155->154->154->155->152->159->157->158->158->158  S/p 23.4% saline x 1    Acute hypoxic respiratory failure with compromised airway Atelectasis   Intubated for IR, continued d/t compromised airway  CCM on board  On vent  On propofol -> off  CXR 5/21 - increased left lung base consolidation/atelectasis. pulm vasc congestion  Extubation 5/21  Weak cough and increased secretion - high risk for re-intubation  3% neb  Chest PT  NT suctioning  Fever   Tmax 100.9  CXR 5/21 - increased left lung base consolidation/atelectasis. pulm vasc congestion  UA pending  CXR pending in am  CCM on board  Hypertensive Emergency  SBP > 220 on arrival  Home meds:  Amlodipine 10, coreg 3.125 bid, HCTZ 25, lisinopril 40  Off cleviprex now  On amlodipine 10, hydralazine 100 q8, labetalol 300 Q8  BP goal < 160 now  BP on the high end -> labetalol IV PRN . Long-term BP goal normotensive  Hyperlipidemia  Home meds:  lipitor 40, resumed in hospital  Direct LDL 95.3, goal < 70  On lipitor 40  Continue statin at discharge  Dysphagia . Secondary to stroke . NPO . cortrak placed . On tube feedings @ 50  . Speech on board  Cocaine abuse  UDS positive for cocaine  Cessation education will be provided   Other Stroke Risk Factors  Advanced age  Former Cigarette smoker, quit 5 yrs ago  ETOH use, advised to drink no more than 2 drink(s) a day  THC abuse - UDS:  THC POSITIVE, cessation education will be  provided.  Family hx stroke (mother)  Coronary artery disease, MI  Other Active Problems  CKD stage II Cre 1.37->1.27->1.51->1.28->1.36  TG elevated at 476->471->106->88->181  Hospital day # 5  Patient continues to be critically ill for the last 24 hours, has worsening cerebral edema on CT needing continued Na monitoring and 23.4% saline if needed, extubated but weak cough and increased secretion with high risk for re-intubation, hypertensive urgency with continued vital monitoring for BP management, dysphagia put on cortrak and fever with likely infection. I discussed with CCM Dr. Lynetta Mare. This patient is critically ill due to left MCA occlusion s/p EVT, cerebral edema, respiratory failure, cocaine abuse and at significant risk of neurological worsening, death form recurrent stroke, hematoma expansion, heart failure, seizure, respiratory failure, brain herniation. This patient's care requires constant monitoring of vital signs, hemodynamics, respiratory and cardiac monitoring, review of multiple databases, neurological assessment, discussion with family, other specialists and medical decision making of high complexity. I spent 40 minutes of neurocritical care time in the care of this patient. I had long discussion with daughters and son at waiting room, updated pt current condition, treatment plan and potential prognosis, and answered all the questions. They expressed understanding and appreciation.    Rosalin Hawking, MD PhD Stroke Neurology 10/07/2019 10:10 AM  To contact Stroke Continuity provider, please refer to [email or web address] After hours, contact General Neurology

## 2019-10-08 ENCOUNTER — Inpatient Hospital Stay (HOSPITAL_COMMUNITY): Payer: Medicare Other

## 2019-10-08 DIAGNOSIS — I63232 Cerebral infarction due to unspecified occlusion or stenosis of left carotid arteries: Secondary | ICD-10-CM

## 2019-10-08 DIAGNOSIS — J9811 Atelectasis: Secondary | ICD-10-CM

## 2019-10-08 DIAGNOSIS — Z0189 Encounter for other specified special examinations: Secondary | ICD-10-CM

## 2019-10-08 DIAGNOSIS — T17908A Unspecified foreign body in respiratory tract, part unspecified causing other injury, initial encounter: Secondary | ICD-10-CM

## 2019-10-08 LAB — BASIC METABOLIC PANEL
Anion gap: 9 (ref 5–15)
BUN: 26 mg/dL — ABNORMAL HIGH (ref 8–23)
CO2: 25 mmol/L (ref 22–32)
Calcium: 9 mg/dL (ref 8.9–10.3)
Chloride: 119 mmol/L — ABNORMAL HIGH (ref 98–111)
Creatinine, Ser: 1.19 mg/dL (ref 0.61–1.24)
GFR calc Af Amer: >60 mL/min (ref >60)
GFR calc non Af Amer: >60 mL/min (ref >60)
Glucose, Bld: 151 mg/dL — ABNORMAL HIGH (ref 70–99)
Potassium: 4.1 mmol/L (ref 3.5–5.1)
Sodium: 153 mmol/L — ABNORMAL HIGH (ref 135–145)

## 2019-10-08 LAB — CBC
HCT: 41.1 % (ref 39.0–52.0)
Hemoglobin: 13.9 g/dL (ref 13.0–17.0)
MCH: 33 pg (ref 26.0–34.0)
MCHC: 33.8 g/dL (ref 30.0–36.0)
MCV: 97.6 fL (ref 80.0–100.0)
Platelets: 239 10*3/uL (ref 150–400)
RBC: 4.21 MIL/uL — ABNORMAL LOW (ref 4.22–5.81)
RDW: 15.9 % — ABNORMAL HIGH (ref 11.5–15.5)
WBC: 11.6 10*3/uL — ABNORMAL HIGH (ref 4.0–10.5)
nRBC: 0 % (ref 0.0–0.2)

## 2019-10-08 LAB — SODIUM
Sodium: 152 mmol/L — ABNORMAL HIGH (ref 135–145)
Sodium: 155 mmol/L — ABNORMAL HIGH (ref 135–145)
Sodium: 156 mmol/L — ABNORMAL HIGH (ref 135–145)

## 2019-10-08 LAB — GLUCOSE, CAPILLARY
Glucose-Capillary: 130 mg/dL — ABNORMAL HIGH (ref 70–99)
Glucose-Capillary: 138 mg/dL — ABNORMAL HIGH (ref 70–99)
Glucose-Capillary: 147 mg/dL — ABNORMAL HIGH (ref 70–99)
Glucose-Capillary: 110 mg/dL — ABNORMAL HIGH (ref 70–99)
Glucose-Capillary: 150 mg/dL — ABNORMAL HIGH (ref 70–99)
Glucose-Capillary: 114 mg/dL — ABNORMAL HIGH (ref 70–99)

## 2019-10-08 MED ORDER — ACETAMINOPHEN 325 MG PO TABS
650.0000 mg | ORAL_TABLET | ORAL | Status: DC | PRN
  Administered 2019-10-08 – 2019-10-21 (×6): 650 mg
  Filled 2019-10-08 (×6): qty 2

## 2019-10-08 MED ORDER — ACETAMINOPHEN 650 MG RE SUPP: 650.0000 mg | RECTAL | Status: DC | PRN

## 2019-10-08 MED ORDER — SENNA 8.6 MG PO TABS
1.0000 | ORAL_TABLET | Freq: Two times a day (BID) | ORAL | Status: DC
  Administered 2019-10-08 – 2019-10-19 (×3): 8.6 mg
  Filled 2019-10-08 (×5): qty 1

## 2019-10-08 MED ORDER — CLEVIDIPINE BUTYRATE 0.5 MG/ML IV EMUL
INTRAVENOUS | Status: AC
  Filled 2019-10-08: qty 50

## 2019-10-08 MED ORDER — FREE WATER: 200.0000 mL | Status: DC

## 2019-10-08 MED ORDER — SODIUM CHLORIDE 3 % IV BOLUS
250.0000 mL | INTRAVENOUS | Status: AC
  Administered 2019-10-08: 250 mL via INTRAVENOUS
  Filled 2019-10-08 (×3): qty 250

## 2019-10-08 MED ORDER — ACETAMINOPHEN 160 MG/5ML PO SOLN
650.0000 mg | ORAL | Status: DC | PRN
  Administered 2019-10-08 – 2019-10-26 (×35): 650 mg
  Filled 2019-10-08 (×34): qty 20.3

## 2019-10-08 MED ORDER — CLONIDINE HCL 0.1 MG PO TABS
0.1000 mg | ORAL_TABLET | Freq: Three times a day (TID) | ORAL | Status: DC
  Filled 2019-10-08: qty 1

## 2019-10-08 MED ORDER — CLONIDINE HCL 0.1 MG PO TABS
0.1000 mg | ORAL_TABLET | Freq: Three times a day (TID) | ORAL | Status: DC
  Administered 2019-10-08 – 2019-10-12 (×13): 0.1 mg
  Filled 2019-10-08 (×13): qty 1

## 2019-10-08 MED ORDER — CLEVIDIPINE BUTYRATE 0.5 MG/ML IV EMUL
0.0000 mg/h | INTRAVENOUS | Status: DC
  Administered 2019-10-08 (×2): 6 mg/h via INTRAVENOUS
  Filled 2019-10-08 (×2): qty 50

## 2019-10-08 MED ORDER — ASPIRIN 325 MG PO TABS
325.0000 mg | ORAL_TABLET | Freq: Every day | ORAL | Status: DC
  Administered 2019-10-08 – 2019-10-26 (×19): 325 mg
  Filled 2019-10-08 (×19): qty 1

## 2019-10-08 MED ORDER — BISACODYL 10 MG RE SUPP
10.0000 mg | Freq: Once | RECTAL | Status: AC
  Administered 2019-10-08: 10 mg via RECTAL
  Filled 2019-10-08: qty 1

## 2019-10-08 MED ORDER — ASPIRIN 300 MG RE SUPP: 300.0000 mg | Freq: Every day | RECTAL | Status: DC

## 2019-10-08 MED ORDER — SODIUM CHLORIDE 0.9 % IV SOLN
3.0000 g | Freq: Four times a day (QID) | INTRAVENOUS | Status: AC
  Administered 2019-10-08 – 2019-10-13 (×20): 3 g via INTRAVENOUS
  Filled 2019-10-08 (×12): qty 3
  Filled 2019-10-08: qty 8
  Filled 2019-10-08 (×7): qty 3

## 2019-10-08 NOTE — Progress Notes (Addendum)
STROKE TEAM PROGRESS NOTE   INTERVAL HISTORY PT/OT in room starting work with pt. Discussed with RN. Pt lying in bed, eyes open, but lethargic. Not following commands, but still moving on the left UE and LE spontaneously, no movement on the right. Still has weak cough and rattling in throat with secretions but not needed for re-intubation. Still has elevated BP even on 4 BP meds, now back on cleviprex. Will add clonidine.    Vitals:   10/08/19 0445 10/08/19 0500 10/08/19 0515 10/08/19 0530  BP: 137/84 (!) 157/74 (!) 153/81 (!) 165/72  Pulse: 70 69 72 72  Resp: (!) 22 (!) 24 (!) 26 (!) 26  Temp:      TempSrc:      SpO2: 96% 95% 96% 97%  Weight:      Height:       CBC:  Recent Labs  Lab 10/02/19 0826 10/02/19 0831 10/07/19 0709 10/08/19 0418  WBC 11.1*   < > 8.8 11.6*  NEUTROABS 8.9*  --   --   --   HGB 15.7   < > 11.5* 13.9  HCT 47.1   < > 36.8* 41.1  MCV 93.3   < > 98.7 97.6  PLT 288   < > 211 239   < > = values in this interval not displayed.   Basic Metabolic Panel:  Recent Labs  Lab 10/04/19 0245 10/04/19 0812 10/04/19 1700 10/04/19 1734 10/07/19 0709 10/07/19 1214 10/07/19 2356 10/08/19 0418  NA 155*   < >  --    < > 158*   < > 156* 153*  K 3.6  --   --    < > 4.0  --   --  4.1  CL 125*  --   --    < > 127*  --   --  119*  CO2 20*  --   --    < > 24  --   --  25  GLUCOSE 164*  --   --    < > 154*  --   --  151*  BUN 15  --   --    < > 34*  --   --  26*  CREATININE 1.27*  --   --    < > 1.36*  --   --  1.19  CALCIUM 8.8*  --   --    < > 8.6*  --   --  9.0  MG 2.4  --  2.3  --   --   --   --   --   PHOS 1.6*  --  4.2  --   --   --   --   --    < > = values in this interval not displayed.   Lipid Panel:     Component Value Date/Time   CHOL 176 10/03/2019 0216   CHOL 231 (H) 02/18/2018 1500   TRIG 181 (H) 10/06/2019 0600   HDL 30 (L) 10/03/2019 0216   HDL 41 02/18/2018 1500   CHOLHDL 5.9 10/03/2019 0216   VLDL UNABLE TO CALCULATE IF TRIGLYCERIDE OVER  400 mg/dL 10/03/2019 0216   LDLCALC UNABLE TO CALCULATE IF TRIGLYCERIDE OVER 400 mg/dL 10/03/2019 0216   LDLCALC 153 (H) 02/18/2018 1500   HgbA1c:  Lab Results  Component Value Date   HGBA1C 5.9 (H) 10/03/2019   Urine Drug Screen:     Component Value Date/Time   LABOPIA NONE DETECTED 10/02/2019 1309   COCAINSCRNUR POSITIVE (  A) 10/02/2019 1309   LABBENZ NONE DETECTED 10/02/2019 1309   AMPHETMU NONE DETECTED 10/02/2019 1309   THCU POSITIVE (A) 10/02/2019 1309   LABBARB NONE DETECTED 10/02/2019 1309    Alcohol Level No results found for: ETH  IMAGING past 24 hours  DG Chest Port 1 View  Result Date: 10/07/2019 CLINICAL DATA:  Atelectasis. EXAM: PORTABLE CHEST 1 VIEW COMPARISON:  10/04/2019 FINDINGS: Endotracheal tube is in place, tip approximately 3.9 centimeters above the carina. LEFT-sided PICC line tip overlies the LOWER superior vena cava. Nasogastric tube is in place, tip overlying the level of the stomach. Heart is enlarged and accentuated by technique. Perihilar venous enlargement. Dense opacity at the LEFT lung base has increased, consistent atelectasis or early consolidation. Shallow lung inflation. IMPRESSION: 1. Increased atelectasis or consolidation at the LEFT lung base. 2. Pulmonary vascular congestion. Electronically Signed   By: Nolon Nations M.D.   On: 10/07/2019 08:58    PHYSICAL EXAM   Temp:  [99 F (37.2 C)-101.8 F (38.8 C)] 100.9 F (38.3 C) (05/22 0400) Pulse Rate:  [47-75] 72 (05/22 0530) Resp:  [11-28] 26 (05/22 0530) BP: (110-196)/(67-98) 165/72 (05/22 0530) SpO2:  [93 %-100 %] 97 % (05/22 0530) FiO2 (%):  [40 %] 40 % (05/21 0838) Weight:  [81.6 kg] 81.6 kg (05/22 0414)  General - Well nourished, well developed, lethargic. Left eye much decreased secretions but still has conjunctiva redness and subconjuctival hemorrhage.  Ophthalmologic - fundi not visualized due to noncooperation.  Cardiovascular - Regular rate and rhythm.  Neuro - lethargic,  eyes open, but still not following commands. Eyes in left gaze preference position, able to track objects and sound on the left visual field, right neglect, right hemianopia, PERRL. Right facial droop, tongue protrusion not cooperative. Spontaneous and purposeful movement of LUE 4-/5 with finger grip 3/5 and LLE 3-/5, but no movement of RUE and RLE. DTR 1+ and no babinski. Sensation, coordination not cooperative and gait not tested.   ASSESSMENT/PLAN [name] with history of HTN who fell the night prior to admission, presenting with right sided weakness and aphasia. Taken to IR for terminus Left ICA occlusion.  Stroke:   L MCA infarcts due to left M1 occlusion s/p unsuccessful L MCA thrombectomy - L MCA infarct likely secondary to L ICA large vessel disease.  Stroke:   Right MCA infarcts in setting of cocaine use and right ICA and MCA large vessel stenoses     Code Stroke CT head cytotoxic edema L insula and frontal operculum. Likely old L parietal cortical and subcortical abnormality. ASPECTS 8    CTA head & neck L ICA occlusion. Widespread atherosclerosis: B CCA to ICA bifurcation R 40%, L 50% w/ extensive soft plaques at bifurcation and bulbs, L 30-40%. Severe B ICA siphon stenoses. R MCA and B PCA atherosclerosis w/ narrowing & irregularity.  CT perfusion 105 penumbra L MCA territory.   Cerebral angio - 10/02/19 - [name], MD - diffuse and severe intracranial atherosclerosis. Proximal L M1 occlusion. Complete recanalization L M1 w/ embotrap w/ poor distal flow d/t severe L ICA stenosis s/p angioplasty. L ICA terminus coil embolization w/ sparing L anterior choroidal.  CT head repeat 5/16 large L basal ganglia hemorrhage/contrast posterior SDH and SAH. Large cytotoxic edema L MCA w/ 34mm midline shift.   CT head 5/17 diminishing SAH contrast. L MCA infarct w/o significant mass effect  CT head 5/18 diminishing SAH. L MCA infarct increased edema,  MLS 30mm. Right parietal small new infarct.   MRI evolving B cerebral infarcts including large L MCA infarct w/ edema and 59mm R midline shift, right parietal and frontal infarcts. No significant hemorrhage.  MRA No flow L carotid systeme following thrombectomy. Advanced intracranial atherosclerosis including moderate R ICA, mild R M1, severe proximal R M2 and severe L V4 stenoses.    CT repeat 5/21 L>R infarct w/o progression. 1cm R midline shift. No hemorrhage   CT repeat 5/23 pending  2D Echo EF 55-60%  TG 471 and direct LDL 95.3, goal < 70  HgbA1c 5.9   UDS + cocaine and THC  lovenox for VTE prophylaxis  No antithrombotic prior to admission, now on ASA 325mg  daily.  Therapy recommendations:  pending  Disposition:  pending   Code status - full code now  Cerebral Edema  Present on admission scan  Repeat CT head 5/17 large left MCA infarct with edema but no significant midline shift  CT 5/18 repeat L MCA infarct increased edema, MLS 61mm. Right parietal small new infarct.  MRI 5/19 - large L MCA infarct with MLS 95mm  CT 5/21 - stable b/l infarct with MLS 1cm  CT 5/23 pending  Off 3% now  Goal Na 150-155  Na 158->158->156->153  S/p 23.4% saline x 1 -> 3% bolus x 1     Acute hypoxic respiratory failure with compromised airway Atelectasis   Intubated for IR, continued d/t compromised airway  CCM on board  On vent  On propofol -> off  CXR 5/21 - increased left lung base consolidation/atelectasis. pulm vasc congestion  Extubation Friday 5/21  Weak cough and increased secretion - high risk for re-intubation  Chest PT  NT suctioning  Fever  Pneumonia, likely aspiration  Tmax 100.9->102.6  Mild leukocytosis - 11.1->8.8->11.6  UA - 10/07/19 - pending  CXR 10/07/19 - Increased atelectasis or consolidation at the LEFT lung base. Pulmonary vascular congestion.  CXR 10/08/19 - Poor inspiratory effort with left basilar atelectasis.  Sputum culture  pending  Blood culture pending  CCM on board  Will start unasyn  Hypertensive Emergency  SBP > 220 on arrival  Home meds:  Amlodipine 10, coreg 3.125 bid, HCTZ 25, lisinopril 40  Back on cleviprex now  On amlodipine 10, hydralazine 100 q8, labetalol 300 Q8, lisinopril 40  BP goal < 160 now  BP on the high end -> add clonidine 0.1 tid . Long-term BP goal normotensive  Hyperlipidemia  Home meds:  lipitor 40, resumed in hospital  Direct LDL 95.3, goal < 70  On lipitor 40  Continue statin at discharge  Dysphagia . Secondary to stroke . NPO . Cortrak placed - 10/07/19 . On tube feedings @ 50  . Speech on board  Cocaine abuse  UDS positive for cocaine  Cessation education will be provided   Other Stroke Risk Factors  Advanced age  Former Cigarette smoker, quit 5 yrs ago  ETOH use, advised to drink no more than 2 drink(s) a day  THC abuse - UDS:  THC POSITIVE, cessation education will be provided.  Family hx stroke (mother)  Coronary artery disease, MI  Other Active Problems  CKD stage II Cre 1.37->1.27->1.51->1.28->1.36->1.19  TG elevated at 476->471->106->88->181 (10/06/19)  Hospital day # 6  Patient continues to be critically ill for the last 24 hours, has developed high grade fever, hypertensive emergency, leukocytosis, aspiration pneumonia, continues to have uncontrolled BP now back on cleviprex IV needing frequent vital monitoring, and I have add clonidine  for BP control, unasyn for pneumonia treatment, ordered CT repeat, ordered 3% saline bolus and blood culture and sputum culture. I also called back PT/OT for evaluation. Also ordered supporsitory for constipation and eye drops for left eye conjunctivitis and subconjunctival hemorrhage. This patient is critically ill due to left MCA occlusion s/p EVT, ICH, SAH and SDH, cerebral edema, respiratory failure, cocaine abuse and at significant risk of neurological worsening, death form recurrent stroke,  hematoma expansion, heart failure, seizure, respiratory failure, brain herniation. This patient's care requires constant monitoring of vital signs, hemodynamics, respiratory and cardiac monitoring, review of multiple databases, neurological assessment, discussion with family, other specialists and medical decision making of high complexity. I spent 40 minutes of neurocritical care time in the care of this patient.    Rosalin Hawking, MD PhD Stroke Neurology 10/08/2019 9:55 AM    To contact Stroke Continuity provider, please refer to [email or web address] After hours, contact General Neurology

## 2019-10-08 NOTE — Plan of Care (Signed)
  Problem: Education: Goal: Knowledge of disease or condition will improve Outcome: Not Progressing   

## 2019-10-08 NOTE — Progress Notes (Signed)
Pharmacy Antibiotic Note  Riley Wagner is a 64 y.o. male admitted on 10/02/2019 with concern for aspiration PNA.  Pharmacy has been consulted for Unasyn dosing.  Plan: - Start Unasyn 3g IV every 6 hours - Will continue to follow renal function, culture results, LOT, and antibiotic de-escalation plans   Height: 5\' 7"  (170.2 cm) Weight: 81.6 kg (179 lb 14.3 oz) IBW/kg (Calculated) : 66.1  Temp (24hrs), Avg:101 F (38.3 C), Min:99.7 F (37.6 C), Max:102.6 F (39.2 C)  Recent Labs  Lab 10/04/19 0245 10/05/19 0723 10/06/19 0600 10/07/19 0709 10/08/19 0418  WBC 14.3* 11.5* 10.3 8.8 11.6*  CREATININE 1.27* 1.51* 1.28* 1.36* 1.19    Estimated Creatinine Clearance: 63.3 mL/min (by C-G formula based on SCr of 1.19 mg/dL).    No Known Allergies  Antimicrobials this admission: Unasyn 5/22 >>  Dose adjustments this admission: n/a  Microbiology results: 5/16 MRSA PCR >> neg 5/16 COVID >> neg 5/18 trach aspirate >> NOF 5/22 RCx >> 5/22 BCx >>  Thank you for allowing pharmacy to be a part of this patient's care.  Alycia Rossetti, PharmD, BCPS Clinical Pharmacist Clinical phone for 10/08/2019: [phone] 10/08/2019 10:05 AM   **Pharmacist phone directory can now be found on [email or web address] ([email or web address]).  Listed under [facility].

## 2019-10-08 NOTE — Progress Notes (Signed)
NAME:  Riley Wagner, MRN:  [id number], DOB:  05-Apr-1956, LOS: 6 ADMISSION DATE:  10/02/2019, CONSULTATION DATE:  10/02/2019 REFERRING MD:  Dr. Debbrah Alar, neuro IR, CHIEF COMPLAINT:  Altered mental status   Brief History   64 yo male former smoker brought to ER with hypertensive crisis (BP 212/110), Rt sided weakness and aphasia.  Found to have Lt terminal ICA occlusion.  Intubated for airway protection.  Noted to have intraparenchymal bleed along posterior limb of IC as well as Rt sided bleed.  Had embolization of Lt ICA terminus.  Past Medical History  HTN, [facility]   5/16 Admit. To IR for embolization of Left ICA. Back to ICU intubated. BP goal 120-140 5/17: still hypertensive in spite of maximum dose Cleviprex.  Continues to exhibit right-sided hemiparesis 5/18: CT brain showing worsening midline shift and cerebral edema.  Still having difficulty with blood pressure control, requiring titration up of oral regimen.  Of note was made DO NOT RESUSCITATE by family on 5/17 5/20 bp under control. Moving all ext except RUE. Spoke w/ family; leaning towards attempt at extubation and trach if fails.  Consults:  Neuro IR  Procedures:  ETT 5/16 >> Radial aline 5/16 >>  Significant Diagnostic Tests:   CT head 5/16 >> acute cytotoxic edema in Lt insula and frontal operculum, extensive chronic small vessel ischemic changes  Echo 5/17 >> Left Ventricle: Left ventricular ejection fraction, by estimation, is 55 to 60%. The left ventricle has normal function. The left ventricle has no regional wall motion abnormalities. The left ventricular internal cavity size was normal in size. There is mild concentric left ventricular hypertrophy. Left ventricular diastolic parameters are consistent with Grade II diastolic dysfunction (pseudonormalization). Elevated left ventricular end-diastolic pressure.  CT brain 5/18: small acute infarct in the right parietal cortex, no new hemorrhage,  left MCA infarct with progressive cytotoxic edema/swelling in 6 mm midline shift CT brain 5/21: 1. Left more than right cerebral infarcts without progression from brain MRI 2 days ago. Cytotoxic edema causes 1 cm of rightward shift.2. No interval hemorrhage. Micro Data:  SARS CoV2 PCR 5/16 >> negative Respiratory culture 5/18: nml flora  Antimicrobials:  Unasyn  Interim history/subjective:  Extubated yesterday.  Maintaining saturation.  Occasional upper airway secretions which clear with coughing.  Able to initiate swallowing.  Keeping higher sodium goal for ongoing edema.  Objective   Blood pressure (!) 158/72, pulse 71, temperature (!) 102.6 F (39.2 C), temperature source Axillary, resp. rate (!) 26, height 5\' 7"  (1.702 m), weight 81.6 kg, SpO2 94 %.        Intake/Output Summary (Last 24 hours) at 10/08/2019 1148 Last data filed at 10/08/2019 0600 Gross per 24 hour  Intake 791.85 ml  Output 2750 ml  Net -1958.15 ml   Filed Weights   10/06/19 0500 10/07/19 0500 10/08/19 0414  Weight: 77 kg 81.7 kg 81.6 kg    Examination: General this is a 64 year old black male, Now on nasal cannula. Awake, not able to follow commands but is purposeful on left side. HENT right sided droop.  Reasonable control of oral secretions  Pulm scattered rhonchi, decreased Right base.  Card RRR w/ systolic murmur abd not tender Ext trace LE edema brisk CR pulses are palp Neuro eyes open spont right UE paresis. RLE 1/5/ left upper and lower w/ 5/5 strength    Resolved Hospital Problem list   non-anion anion gap metabolic acidosis  Assessment & Plan:   Acute hypoxic  respiratory insufficiency with compromised airway. Atelectasis Appears to be able to manage secretions reasonably well. -Continue pulmonary toilet -Chest physiotherapy as needed.    Hypertensive emergency.  Requiring titration of clevidipine to maintain systolic blood pressure less than 160 Hx of HLD. -Oral medications optimized  by neurology today.  Follow response.  Lt ICA occlusion with intraparenchymal bleed along posterior limb of IC as well as Rt sided bleed. MRI brain showed evolving bilateral cerebral infarcts, large left MCA infarct, unchanged cytotoxic edema with 8 mm of rightward midline shift.  There is negative flow in the intracranial left ICA left MCA or left A1 segment -Prognosis for meaningful functional recovery poor -Continue mild hypernatremia for cerebral edema.  CKD2. Serum creatinine stable plan Trend chemistry   Therapeutic hypernatremia, mild hyperchloremic  Plan Holding free water given HT protocol Am chemistry   Hyperglycemia Plan Cont ssi   Best practice:  Diet: NPO-->start tubefeeds /17 DVT prophylaxis: SCDs & LMWH GI prophylaxis: protonix Mobility: bed rest Code Status: full code Disposition: ICU  Kipp Brood, MD [facility] ICU Physician [place]  Pager: [phone] Mobile: [phone] After hours: [phone].  10/08/2019, 11:51 AM      Pager # [phone] OR # [phone] if no answer

## 2019-10-08 NOTE — Evaluation (Signed)
Clinical/Bedside Swallow Evaluation Patient Details  Name: Riley Wagner MRN: [id number] Date of Birth: 02/03/1956  Today's Date: 10/08/2019 Time: SLP Start Time (ACUTE ONLY): 1300 SLP Stop Time (ACUTE ONLY): 1311 SLP Time Calculation (min) (ACUTE ONLY): 11 min  Past Medical History:  Past Medical History:  Diagnosis Date  . Hypertension   . MI (myocardial infarction) [facility])    Past Surgical History:  Past Surgical History:  Procedure Laterality Date  . BACK SURGERY     2004  . IR ANGIOGRAM FOLLOW UP STUDY  10/02/2019  . IR CT HEAD LTD  10/02/2019  . IR CT HEAD LTD  10/02/2019  . IR PERCUTANEOUS ART THROMBECTOMY/INFUSION INTRACRANIAL INC DIAG ANGIO  10/02/2019  . IR PTA INTRACRANIAL  10/02/2019  . IR TRANSCATH/EMBOLIZ  10/02/2019  . IR US GUIDE VASC ACCESS RIGHT  10/02/2019  . RADIOLOGY WITH ANESTHESIA N/A 10/02/2019   Procedure: IR WITH ANESTHESIA;  Surgeon: Radiologist, Medication, MD;  Location: [facility];  Service: Radiology;  Laterality: N/A;   HPI:  64 yo male former smoker brought to ER on 5/16 with hypertensive crisis (BP 212/110), Rt sided weakness and aphasia.  Found to have Lt terminal ICA occlusion.  Intubated for airway protection 5/16-21.  Dx left MCA infarct due to left MI occlusion s/p unsuccessful L MCA thrombectomy; Right MCA infarcts in setting of cocaine use and right ICA and MCA large vessel stenoses.  CT 5/21 showed stable bilateral infarcts with midline shift of one cm.        Assessment / Plan / Recommendation Clinical Impression  Pt with significant dysphagia secondary to critical illness, bilateral CVAs, poor arousal.  Pt opened his eyes to stimulation.  He did not follow commands nor vocalize, either on command or spontaneously.  Oral care provided - pt resistant, turning head away and batting with LUE.  Congested with audible secretions; no spontaneous swallowing observed today during provision of oral care.  No attempt to remove ice chip from spoon or receive when  placed against lips.  Pt currently with cortrak; recommend continued NPO.  SLP will follow for speech/language assessment and PO trials as mental status allows. D/W RN.  SLP Visit Diagnosis: Dysphagia, oropharyngeal phase (R13.12)    Aspiration Risk  Severe aspiration risk    Diet Recommendation   npo with cortrak       Other  Recommendations Oral Care Recommendations: Oral care QID   Follow up Recommendations Other (comment)(tba)      Frequency and Duration min 2x/week  2 weeks       Prognosis Prognosis for Safe Diet Advancement: Fair      Swallow Study   General Date of Onset: 10/02/19 HPI: 64 yo male former smoker brought to ER on 5/16 with hypertensive crisis (BP 212/110), Rt sided weakness and aphasia.  Found to have Lt terminal ICA occlusion.  Intubated for airway protection 5/16-21.  Dx left MCA infarct due to left MI occlusion s/p unsuccessful L MCA thrombectomy; Right MCA infarcts in setting of cocaine use and right ICA and MCA large vessel stenoses.  CT 5/21 showed stable bilateral infarcts with midline shift of one cm.      Type of Study: Bedside Swallow Evaluation Diet Prior to this Study: NPO;NG Tube Temperature Spikes Noted: No Respiratory Status: Nasal cannula History of Recent Intubation: Yes Length of Intubations (days): 5 days Date extubated: 10/07/19 Behavior/Cognition: Alert Oral Cavity Assessment: Within Functional Limits Oral Care Completed by SLP: Yes Self-Feeding Abilities: Total assist Patient Positioning: Upright in  bed Baseline Vocal Quality: Not observed Volitional Cough: Cognitively unable to elicit Volitional Swallow: Unable to elicit    Oral/Motor/Sensory Function Overall Oral Motor/Sensory Function: Other (comment)(unable to assess)   Ice Chips Ice chips: Impaired Presentation: Spoon Oral Phase Impairments: Poor awareness of bolus   Thin Liquid Thin Liquid: Not tested    Nectar Thick Nectar Thick Liquid: Not tested   Honey Thick Honey  Thick Liquid: Not tested   Puree Puree: Not tested   Solid     Solid: Not tested      Juan Quam Laurice 10/08/2019,1:57 PM   [place] Osten L. Tivis Ringer, [place] Office number [phone] Pager [phone]

## 2019-10-08 NOTE — Evaluation (Signed)
Occupational Therapy Evaluation Patient Details Name: Riley Wagner MRN: [id number] DOB: 13-Mar-1956 Today's Date: 10/08/2019    History of Present Illness 64 y.o. male with a history of hypertension who was last known well prior to bed 5/15. He lost conciousness last night and fell, but refused transport with BP of 99991111 systolic and negative stroke screen by EMS. On 5/16 pt with R weakness and apahasia on awakening. Pt found to have large penumbra on CT and taken for emergent IR thrombectomy of L MCA followed by L ICA coil embolization 2/2 bleeding. Pt extubated on 5/21.   Clinical Impression   Pt admitted with above diagnoses, unsure of PLOF due to pt current cognitive and communication status. Pt completed bed mobility with total A +2 and needing continued max-total A to maintain sitting. Some righting reactions noted with L arm to L lateral lean. RUE is entirely flaccid. Pt is not making forms of purposeful communication or visually attending/tracking therapist this date. He is currently total A for all BADLs at this time. Pt followed one step command of squeezing therapist hand with <25% accuracy. Given current status, recommend SNF at d/c to continue BADL progression in safe environment. Will continue to follow per POC listed below.    Follow Up Recommendations  SNF;Supervision/Assistance - 24 hour    Equipment Recommendations  Wheelchair (measurements OT);Wheelchair cushion (measurements OT);Hospital bed    Recommendations for Other Services       Precautions / Restrictions Precautions Precautions: Fall Precaution Comments: [id number] systolically Restrictions Weight Bearing Restrictions: No      Mobility Bed Mobility Overal bed mobility: Needs Assistance Bed Mobility: Supine to Sit;Sit to Supine;Rolling Rolling: Total assist;+2 for physical assistance;+2 for safety/equipment   Supine to sit: Total assist;+2 for physical assistance;+2 for safety/equipment Sit to supine: Total  assist;+2 for physical assistance;+2 for safety/equipment   General bed mobility comments: no purposeful initiation from pt this session  Transfers                      Balance Overall balance assessment: Needs assistance Sitting-balance support: Bilateral upper extremity supported;Feet unsupported Sitting balance-Leahy Scale: Zero Sitting balance - Comments: maxA, does push to right side with LUE, some righting to posterior LOB but no righting to anterior LOB Postural control: Right lateral lean                                 ADL either performed or assessed with clinical judgement   ADL                                         General ADL Comments: Pt is currently total A for all BADLs. Attempted to initiate seated grooming EOB and pt turning head and resisting efforts to engage in washing face     Vision Patient Visual Report: Other (comment)(unsure pt not able to report) Additional Comments: R eye does not respond to visual threat. L eye is very red (per Dr. Erlinda Hong may have infection). Pt not purposefully tracking or maintaining visual attention this date     Perception     Praxis      Pertinent Vitals/Pain Pain Assessment: Faces Faces Pain Scale: No hurt     Hand Dominance     Extremity/Trunk Assessment Upper Extremity Assessment Upper Extremity Assessment: RUE deficits/detail;LUE deficits/detail RUE  Deficits / Details: flaccid, tight end range ~90 degrees of shoulder flexion LUE Deficits / Details: poor coordingation and motor control. Strength intact   Lower Extremity Assessment Lower Extremity Assessment: Defer to PT evaluation RLE Deficits / Details: flaccid, ROM WFL, increased knee flexor tone with catch and release RLE Sensation: (no response to painful stimuli) LLE Deficits / Details: purposeful movement of LLE but not to command, increased knee flexor tone with catch and release.  LLE Sensation: (no response to painful  stimuli)   Cervical / Trunk Assessment Cervical / Trunk Assessment: Normal   Communication Communication Communication: Receptive difficulties;Expressive difficulties   Cognition Arousal/Alertness: Awake/alert Behavior During Therapy: Flat affect Overall Cognitive Status: Difficult to assess Area of Impairment: Attention;Safety/judgement;Following commands;Awareness;Problem solving                   Current Attention Level: Focused   Following Commands: Follows one step commands inconsistently Safety/Judgement: Decreased awareness of safety;Decreased awareness of deficits Awareness: Intellectual Problem Solving: Slow processing;Requires verbal cues;Requires tactile cues;Decreased initiation General Comments: pt following <25% of commands this date. Resisting attempts at ADLs. Nonverbal throughout session   General Comments  pt on 2L [place], BP stable in 160s/70-80s    Exercises     Shoulder Instructions      Home Living Family/patient expects to be discharged to:: Unsure                                 Additional Comments: unable to determine history, pt unable to meaningfully communicate this session, no family present      Prior Functioning/Environment          Comments: unable to obtain history        OT Problem List: Decreased strength;Impaired vision/perception;Decreased knowledge of use of DME or AE;Impaired tone;Decreased range of motion;Decreased coordination;Decreased activity tolerance;Decreased cognition;Cardiopulmonary status limiting activity;Impaired UE functional use;Impaired balance (sitting and/or standing);Decreased safety awareness;Impaired sensation      OT Treatment/Interventions: Self-care/ADL training;Visual/perceptual remediation/compensation;Therapeutic exercise;Patient/family education;Balance training;Neuromuscular education;Energy conservation;Therapeutic activities;DME and/or AE instruction;Cognitive  remediation/compensation    OT Goals(Current goals can be found in the care plan section) Acute Rehab OT Goals Patient Stated Goal: to improve sitting balance and bed mobility- PT goal OT Goal Formulation: Patient unable to participate in goal setting Time For Goal Achievement: 10/22/19 Potential to Achieve Goals: Fair  OT Frequency: Min 2X/week   Barriers to D/C:            Co-evaluation PT/OT/SLP Co-Evaluation/Treatment: Yes Reason for Co-Treatment: Complexity of the patient's impairments (multi-system involvement);Necessary to address cognition/behavior during functional activity;For patient/therapist safety;To address functional/ADL transfers PT goals addressed during session: Mobility/safety with mobility;Balance;Strengthening/ROM OT goals addressed during session: ADL's and self-care      AM-PAC OT "6 Clicks" Daily Activity     Outcome Measure Help from another person eating meals?: Total(coretrak) Help from another person taking care of personal grooming?: Total Help from another person toileting, which includes using toliet, bedpan, or urinal?: Total Help from another person bathing (including washing, rinsing, drying)?: Total Help from another person to put on and taking off regular upper body clothing?: Total Help from another person to put on and taking off regular lower body clothing?: Total 6 Click Score: 6   End of Session Nurse Communication: Mobility status  Activity Tolerance: Treatment limited secondary to medical complications (Comment);Other (comment)(cognitive status) Patient left: in bed;with call bell/phone within reach;with bed alarm set;with restraints reapplied  OT  Visit Diagnosis: Unsteadiness on feet (R26.81);Other abnormalities of gait and mobility (R26.89);Muscle weakness (generalized) (M62.81);Other symptoms and signs involving cognitive function;Hemiplegia and hemiparesis Hemiplegia - Right/Left: Right Hemiplegia - dominant/non-dominant:  (unsure) Hemiplegia - caused by: Cerebral infarction                [id number] OT Time Calculation (min): 23 min Charges:  OT General Charges $OT Visit: 1 Visit OT Evaluation $OT Eval High Complexity: 1 High  Zenovia Jarred, MSOT, OTR/L Acute Rehabilitation Services [facility] Office Number: [phone] Pager: [phone]  Zenovia Jarred 10/08/2019, 1:26 PM

## 2019-10-08 NOTE — Evaluation (Signed)
Physical Therapy Evaluation Patient Details Name: Riley Wagner MRN: [id number] DOB: January 22, 1956 Today's Date: 10/08/2019   History of Present Illness  64 y.o. male with a history of hypertension who was last known well prior to bed 5/15. He lost conciousness last night and fell, but refused transport with BP of 99991111 systolic and negative stroke screen by EMS. On 5/16 pt with R weakness and apahasia on awakening. Pt found to have large penumbra on CT and taken for emergent IR thrombectomy of L MCA followed by L ICA coil embolization 2/2 bleeding. Pt extubated on 5/21.  Clinical Impression  Pt presents to PT with deficits in functional mobility, balance, endurance, cognition, strength, power, ROM, coordination, motor control. Pt demonstrates very limited ability to follow commands, seemingly squeezing therapist hand once during session with a multitude of motor commands provided and not followed. Pt does not appear to track or focus on therapist or objects during session and is unable to meaningfully communicate in a verbal or written manner this session. Pt demonstrates significant strength and balance deficits and requires totalA for all functional mobility and maxA to maintain sitting balance with some pushing to R noted. Pt will continue to benefit from acute PT POC to reduce falls risk and improve mobility. PT currently recommends SNF at time of discharge.    Follow Up Recommendations SNF;Supervision/Assistance - 24 hour    Equipment Recommendations  Wheelchair (measurements PT);Wheelchair cushion (measurements PT);Hospital bed(mechanical lift)    Recommendations for Other Services       Precautions / Restrictions Precautions Precautions: Fall Precaution Comments: BP<180 Restrictions Weight Bearing Restrictions: No      Mobility  Bed Mobility Overal bed mobility: Needs Assistance Bed Mobility: Supine to Sit;Sit to Supine;Rolling Rolling: Total assist   Supine to sit: Total  assist Sit to supine: Total assist      Transfers                    Ambulation/Gait                Stairs            Wheelchair Mobility    Modified Rankin (Stroke Patients Only)       Balance Overall balance assessment: Needs assistance Sitting-balance support: Bilateral upper extremity supported;Feet unsupported Sitting balance-Leahy Scale: Zero Sitting balance - Comments: maxA, does push to right side with LUE, some righting to posterior LOB but no righting to anterior LOB Postural control: Right lateral lean                                   Pertinent Vitals/Pain Pain Assessment: Faces Faces Pain Scale: No hurt    Home Living Family/patient expects to be discharged to:: Unsure                 Additional Comments: unable to determine history, pt unable to meaningfully communicate this session, no family present    Prior Function           Comments: unable to obtain history     Hand Dominance        Extremity/Trunk Assessment   Upper Extremity Assessment Upper Extremity Assessment: Defer to OT evaluation    Lower Extremity Assessment Lower Extremity Assessment: RLE deficits/detail;LLE deficits/detail RLE Deficits / Details: flaccid, ROM WFL, increased knee flexor tone with catch and release RLE Sensation: (no response to painful stimuli) LLE Deficits / Details: purposeful  movement of LLE but not to command, increased knee flexor tone with catch and release.  LLE Sensation: (no response to painful stimuli)    Cervical / Trunk Assessment Cervical / Trunk Assessment: Normal  Communication   Communication: Receptive difficulties;Expressive difficulties  Cognition Arousal/Alertness: Awake/alert Behavior During Therapy: Flat affect Overall Cognitive Status: No family/caregiver present to determine baseline cognitive functioning Area of Impairment: Attention;Safety/judgement;Following  commands;Awareness;Problem solving                   Current Attention Level: Focused   Following Commands: Follows one step commands inconsistently(very inconsistent, squeezes hand once to command) Safety/Judgement: Decreased awareness of safety;Decreased awareness of deficits Awareness: Intellectual Problem Solving: Slow processing;Requires verbal cues;Requires tactile cues General Comments: pt unable to follow commands consistently, resisting attempts at ADLs, non-verbal this session      General Comments General comments (skin integrity, edema, etc.): pt on 2L [place], BP stable in 160s/70-80s    Exercises     Assessment/Plan    PT Assessment Patient needs continued PT services  PT Problem List Decreased strength;Decreased range of motion;Decreased activity tolerance;Decreased balance;Decreased mobility;Decreased cognition;Decreased knowledge of use of DME;Decreased safety awareness;Decreased coordination;Decreased knowledge of precautions;Cardiopulmonary status limiting activity;Impaired sensation;Impaired tone       PT Treatment Interventions DME instruction;Functional mobility training;Therapeutic activities;Therapeutic exercise;Balance training;Neuromuscular re-education;Cognitive remediation;Patient/family education;Wheelchair mobility training    PT Goals (Current goals can be found in the Care Plan section)  Acute Rehab PT Goals Patient Stated Goal: to improve sitting balance and bed mobility- PT goal PT Goal Formulation: Patient unable to participate in goal setting Time For Goal Achievement: 10/22/19 Potential to Achieve Goals: Poor    Frequency Min 3X/week   Barriers to discharge        Co-evaluation PT/OT/SLP Co-Evaluation/Treatment: Yes Reason for Co-Treatment: Complexity of the patient's impairments (multi-system involvement);Necessary to address cognition/behavior during functional activity;For patient/therapist safety PT goals addressed during session:  Mobility/safety with mobility;Balance;Strengthening/ROM         AM-PAC PT "6 Clicks" Mobility  Outcome Measure Help needed turning from your back to your side while in a flat bed without using bedrails?: Total Help needed moving from lying on your back to sitting on the side of a flat bed without using bedrails?: Total Help needed moving to and from a bed to a chair (including a wheelchair)?: Total Help needed standing up from a chair using your arms (e.g., wheelchair or bedside chair)?: Total Help needed to walk in hospital room?: Total Help needed climbing 3-5 steps with a railing? : Total 6 Click Score: 6    End of Session Equipment Utilized During Treatment: Oxygen Activity Tolerance: Patient tolerated treatment well Patient left: in bed;with call bell/phone within reach;with bed alarm set;with restraints reapplied;with SCD's reapplied Nurse Communication: Mobility status;Need for lift equipment PT Visit Diagnosis: Other abnormalities of gait and mobility (R26.89);Muscle weakness (generalized) (M62.81);Hemiplegia and hemiparesis;Other symptoms and signs involving the nervous system (R29.898) Hemiplegia - Right/Left: Right Hemiplegia - dominant/non-dominant: (unable to determine) Hemiplegia - caused by: Cerebral infarction    [id number] PT Time Calculation (min) (ACUTE ONLY): 51 min   Charges:   PT Evaluation $PT Eval Moderate Complexity: 1 Mod PT Treatments $Therapeutic Activity: 8-22 mins        Zenaida Niece, PT, DPT Acute Rehabilitation Pager: [phone]   Zenaida Niece 10/08/2019, 10:52 AM

## 2019-10-09 ENCOUNTER — Inpatient Hospital Stay (HOSPITAL_COMMUNITY): Payer: Medicare Other

## 2019-10-09 DIAGNOSIS — D72829 Elevated white blood cell count, unspecified: Secondary | ICD-10-CM

## 2019-10-09 DIAGNOSIS — E7800 Pure hypercholesterolemia, unspecified: Secondary | ICD-10-CM

## 2019-10-09 DIAGNOSIS — N1831 Chronic kidney disease, stage 3a: Secondary | ICD-10-CM

## 2019-10-09 LAB — BASIC METABOLIC PANEL
Anion gap: 9 (ref 5–15)
BUN: 30 mg/dL — ABNORMAL HIGH (ref 8–23)
CO2: 24 mmol/L (ref 22–32)
Calcium: 8.8 mg/dL — ABNORMAL LOW (ref 8.9–10.3)
Chloride: 121 mmol/L — ABNORMAL HIGH (ref 98–111)
Creatinine, Ser: 1.23 mg/dL (ref 0.61–1.24)
GFR calc Af Amer: >60 mL/min (ref >60)
GFR calc non Af Amer: >60 mL/min (ref >60)
Glucose, Bld: 146 mg/dL — ABNORMAL HIGH (ref 70–99)
Potassium: 3.9 mmol/L (ref 3.5–5.1)
Sodium: 154 mmol/L — ABNORMAL HIGH (ref 135–145)

## 2019-10-09 LAB — URINALYSIS, COMPLETE (UACMP) WITH MICROSCOPIC
Bacteria, UA: NONE SEEN
Bilirubin Urine: NEGATIVE
Glucose, UA: NEGATIVE mg/dL
Hgb urine dipstick: NEGATIVE
Ketones, ur: NEGATIVE mg/dL
Leukocytes,Ua: NEGATIVE
Nitrite: NEGATIVE
Protein, ur: 30 mg/dL — AB
Specific Gravity, Urine: 1.024 (ref 1.005–1.030)
pH: 8 (ref 5.0–8.0)

## 2019-10-09 LAB — SODIUM
Sodium: 153 mmol/L — ABNORMAL HIGH (ref 135–145)
Sodium: 154 mmol/L — ABNORMAL HIGH (ref 135–145)
Sodium: 154 mmol/L — ABNORMAL HIGH (ref 135–145)

## 2019-10-09 LAB — TRIGLYCERIDES: Triglycerides: 115 mg/dL (ref <150)

## 2019-10-09 LAB — GLUCOSE, CAPILLARY
Glucose-Capillary: 106 mg/dL — ABNORMAL HIGH (ref 70–99)
Glucose-Capillary: 111 mg/dL — ABNORMAL HIGH (ref 70–99)
Glucose-Capillary: 111 mg/dL — ABNORMAL HIGH (ref 70–99)
Glucose-Capillary: 114 mg/dL — ABNORMAL HIGH (ref 70–99)
Glucose-Capillary: 126 mg/dL — ABNORMAL HIGH (ref 70–99)
Glucose-Capillary: 130 mg/dL — ABNORMAL HIGH (ref 70–99)

## 2019-10-09 LAB — CBC
HCT: 40.8 % (ref 39.0–52.0)
Hemoglobin: 13 g/dL (ref 13.0–17.0)
MCH: 30.5 pg (ref 26.0–34.0)
MCHC: 31.9 g/dL (ref 30.0–36.0)
MCV: 95.8 fL (ref 80.0–100.0)
Platelets: 244 10*3/uL (ref 150–400)
RBC: 4.26 MIL/uL (ref 4.22–5.81)
RDW: 15.7 % — ABNORMAL HIGH (ref 11.5–15.5)
WBC: 10.4 10*3/uL (ref 4.0–10.5)
nRBC: 0 % (ref 0.0–0.2)

## 2019-10-09 MED ORDER — CHLORTHALIDONE 25 MG PO TABS
25.0000 mg | ORAL_TABLET | Freq: Every day | ORAL | Status: DC
  Filled 2019-10-09: qty 1

## 2019-10-09 MED ORDER — CHLORTHALIDONE 25 MG PO TABS
25.0000 mg | ORAL_TABLET | Freq: Every day | ORAL | Status: DC
  Administered 2019-10-09 – 2019-10-15 (×6): 25 mg
  Filled 2019-10-09 (×7): qty 1

## 2019-10-09 NOTE — Progress Notes (Addendum)
STROKE TEAM PROGRESS NOTE   INTERVAL HISTORY RN at bedside. Pt lying in bed, neuro unchanged. Eyes open but global aphasia, not moving on the right side. Na 154. Still has fever 101.3 on Abx. Still on TF. BP stable and off cleviprex but he is on 6 BP meds now.     Vitals:   10/09/19 0200 10/09/19 0300 10/09/19 0400 10/09/19 0500  BP: (!) 151/89 (!) 179/103 (!) 160/78   Pulse: (!) 54 66 66   Resp: (!) 30 (!) 22 (!) 24   Temp:   98.5 F (36.9 C)   TempSrc:   Oral   SpO2: 94% 95% 93%   Weight:    80.6 kg  Height:       CBC:  Recent Labs  Lab 10/02/19 0826 10/02/19 0831 10/08/19 0418 10/09/19 0529  WBC 11.1*   < > 11.6* 10.4  NEUTROABS 8.9*  --   --   --   HGB 15.7   < > 13.9 13.0  HCT 47.1   < > 41.1 40.8  MCV 93.3   < > 97.6 95.8  PLT 288   < > 239 244   < > = values in this interval not displayed.   Basic Metabolic Panel:  Recent Labs  Lab 10/04/19 0245 10/04/19 0812 10/04/19 1700 10/04/19 1734 10/07/19 0709 10/07/19 1214 10/08/19 0418 10/08/19 1137 10/08/19 1736 10/09/19 0026  NA 155*   < >  --    < > 158*   < > 153*   < > 155* 153*  K 3.6  --   --    < > 4.0  --  4.1  --   --   --   CL 125*  --   --    < > 127*  --  119*  --   --   --   CO2 20*  --   --    < > 24  --  25  --   --   --   GLUCOSE 164*  --   --    < > 154*  --  151*  --   --   --   BUN 15  --   --    < > 34*  --  26*  --   --   --   CREATININE 1.27*  --   --    < > 1.36*  --  1.19  --   --   --   CALCIUM 8.8*  --   --    < > 8.6*  --  9.0  --   --   --   MG 2.4  --  2.3  --   --   --   --   --   --   --   PHOS 1.6*  --  4.2  --   --   --   --   --   --   --    < > = values in this interval not displayed.   Lipid Panel:     Component Value Date/Time   CHOL 176 10/03/2019 0216   CHOL 231 (H) 02/18/2018 1500   TRIG 181 (H) 10/06/2019 0600   HDL 30 (L) 10/03/2019 0216   HDL 41 02/18/2018 1500   CHOLHDL 5.9 10/03/2019 0216   VLDL UNABLE TO CALCULATE IF TRIGLYCERIDE OVER 400 mg/dL 10/03/2019  0216   LDLCALC UNABLE TO CALCULATE IF TRIGLYCERIDE OVER 400 mg/dL 10/03/2019 0216  LDLCALC 153 (H) 02/18/2018 1500   HgbA1c:  Lab Results  Component Value Date   HGBA1C 5.9 (H) 10/03/2019   Urine Drug Screen:     Component Value Date/Time   LABOPIA NONE DETECTED 10/02/2019 1309   COCAINSCRNUR POSITIVE (A) 10/02/2019 1309   LABBENZ NONE DETECTED 10/02/2019 1309   AMPHETMU NONE DETECTED 10/02/2019 1309   THCU POSITIVE (A) 10/02/2019 1309   LABBARB NONE DETECTED 10/02/2019 1309    Alcohol Level No results found for: ETH  IMAGING past 24 hours  CT HEAD WO CONTRAST  Result Date: 10/09/2019 CLINICAL DATA:  Follow-up examination for acute stroke. EXAM: CT HEAD WITHOUT CONTRAST TECHNIQUE: Contiguous axial images were obtained from the base of the skull through the vertex without intravenous contrast. COMPARISON:  Prior head CT from 10/07/2018. FINDINGS: Brain: Continued interval evolution of large volume cytotoxic edema throughout the entirety of the left MCA distribution, with involvement of the left ACA territory at the anterior inferior left frontal lobe. Contrast staining and/or petechial hemorrhage at the left basal ganglia is less dense and conspicuous as compared to previous. Similar regional mass effect with up to 10 mm of left-to-right shift. Stable ventricular size and morphology without hydrocephalus or trapping. Additional smaller evolving multifocal infarcts in involving the right frontal and parietal lobes are relatively unchanged without mass effect or hemorrhage. No new intracranial hemorrhage or large vessel territory infarct. No extra-axial collection. Underlying atrophy with chronic microvascular ischemic disease again noted. Vascular: Streak artifact from prior coil embolization at the left ICA terminus. Skull: Scalp soft tissues and calvarium are stable without acute abnormality. Sinuses/Orbits: Globes and orbital soft tissues within normal limits. Chronic right greater than  left paranasal sinus disease again noted. Nasogastric tube in place. Improved aeration of both mastoid air cells as compared to previous. Other: None. IMPRESSION: 1. Continued interval evolution of left greater than right ischemic infarcts with unchanged 10 mm of left-to-right shift. No hydrocephalus or ventricular trapping. 2. Interval decrease in conspicuity of contrast staining and/or petechial hemorrhage at the left basal ganglia. No other evidence for hemorrhagic transformation. 3. No other new abnormality. Electronically Signed   By: Jeannine Boga M.D.   On: 10/09/2019 03:46    PHYSICAL EXAM  Temp:  [98.5 F (36.9 C)-102.6 F (39.2 C)] 98.5 F (36.9 C) (05/23 0400) Pulse Rate:  [50-75] 66 (05/23 0400) Resp:  [17-30] 24 (05/23 0400) BP: (114-181)/(67-107) 160/78 (05/23 0400) SpO2:  [92 %-97 %] 93 % (05/23 0400) Weight:  [80.6 kg] 80.6 kg (05/23 0500)  General - Well nourished, well developed, lethargic. Left eye much decreased secretions but still has conjunctiva redness and subconjuctival hemorrhage.  Ophthalmologic - fundi not visualized due to noncooperation.  Cardiovascular - Regular rate and rhythm.  Neuro - lethargic, eyes open, but still global aphasia and not following commands. Eyes in left gaze preference position, not cross midline, able to track objects and sound on the left visual field, right neglect, right hemianopia, PERRL. Right facial droop, tongue protrusion not cooperative. Spontaneous and purposeful movement of LUE 4-/5 with finger grip 3/5 and LLE 3-/5, but no movement of RUE and RLE. Slight withdraw of RLE on pain stimulation. DTR 1+ and no babinski. Sensation, coordination not cooperative and gait not tested.   ASSESSMENT/PLAN Riley Wagner is a 64 y.o. male with history of HTN who fell the night prior to admission, presenting with right sided weakness and aphasia. Taken to IR for terminus Left ICA occlusion.  Stroke:   L MCA infarcts  due to left M1  occlusion s/p unsuccessful L MCA thrombectomy - L MCA infarct likely secondary to L ICA large vessel disease.  Stroke:   Right MCA infarcts in setting of cocaine use and right ICA and MCA large vessel stenoses     Code Stroke CT head cytotoxic edema L insula and frontal operculum. Likely old L parietal cortical and subcortical abnormality. ASPECTS 8    CTA head & neck L ICA occlusion. Widespread atherosclerosis: B CCA to ICA bifurcation R 40%, L 50% w/ extensive soft plaques at bifurcation and bulbs, L 30-40%. Severe B ICA siphon stenoses. R MCA and B PCA atherosclerosis w/ narrowing & irregularity.  CT perfusion 105 penumbra L MCA territory.   Cerebral angio - 10/02/19 - [name], MD - diffuse and severe intracranial atherosclerosis. Proximal L M1 occlusion. Complete recanalization L M1 w/ embotrap w/ poor distal flow d/t severe L ICA stenosis s/p angioplasty. L ICA terminus coil embolization w/ sparing L anterior choroidal.  CT head repeat 5/16 large L basal ganglia hemorrhage/contrast posterior SDH and SAH. Large cytotoxic edema L MCA w/ 78mm midline shift.   CT head 5/17 diminishing SAH contrast. L MCA infarct w/o significant mass effect  CT head 5/18 diminishing SAH. L MCA infarct increased edema, MLS 8mm. Right parietal small new infarct.   MRI evolving B cerebral infarcts including large L MCA infarct w/ edema and 87mm R midline shift, right parietal and frontal infarcts. No significant hemorrhage.  MRA No flow L carotid systeme following thrombectomy. Advanced intracranial atherosclerosis including moderate R ICA, mild R M1, severe proximal R M2 and severe L V4 stenoses.    CT repeat 5/21 L>R infarct w/o progression. 1cm R midline shift. No hemorrhage   CT repeat 5/23 - Continued interval evolution of left greater than right ischemic infarcts with unchanged 1cm of left-to-right shift.   2D Echo EF 55-60%   TG 471 and direct LDL 95.3, goal < 70  HgbA1c 5.9    UDS + cocaine and THC  lovenox for VTE prophylaxis  No antithrombotic prior to admission, now on ASA 325mg  daily. Not on DAPT in case of PEG or trach.   Therapy recommendations:  SNF   Disposition:  pending   Code status - full code now  Cerebral Edema  Present on admission scan  Repeat CT head 5/17 large left MCA infarct with edema but no significant midline shift  CT 5/18 repeat L MCA infarct increased edema, MLS 20mm. Right parietal small new infarct.  MRI 5/19 - large L MCA infarct with MLS 79mm  CT 5/21 - stable b/l infarct with MLS 1cm  CT 5/23 - unchanged 10 mm of left-to-right shift.   Off 3% now  Goal Na 150-155  Na 158->158->156->153->155->154  S/p 23.4% saline x 1 -> 3% bolus x 1     Acute hypoxic respiratory failure with compromised airway Atelectasis   Intubated for IR, continued d/t compromised airway  CCM on board  On vent  On propofol -> off  CXR 5/21 - increased left lung base consolidation/atelectasis. pulm vasc congestion  Extubation Friday 5/21  Weak cough and increased secretion - high risk for re-intubation  Chest PT  NT suctioning  Fever  Pneumonia, likely aspiration  Tmax 100.9->102.6->101.3  Mild leukocytosis - 11.1->8.8->11.6->10.4  UA - 10/07/19 - still pending  CXR 10/07/19 - Increased atelectasis or consolidation at the LEFT lung base. Pulmonary vascular congestion.  CXR 10/08/19 - Poor inspiratory effort with left basilar atelectasis.  Sputum culture pending (so far consistent with normal respiratory flora)  Blood culture pending  CCM on board  Unasyn - 5/22>>   Hypertensive Emergency  SBP > 220 on arrival  Home meds:  Amlodipine 10, coreg 3.125 bid, HCTZ 25, lisinopril 40  Off cleviprex now  On amlodipine 10, hydralazine 100 q8, labetalol 300 Q8, lisinopril 40  BP goal < 160 now   BP on the high end -> on clonidine 0.1 tid, new hygroton 25 added today  . Long-term BP goal  normotensive  Hyperlipidemia  Home meds:  lipitor 40, resumed in hospital  Direct LDL 95.3, goal < 70  On lipitor 40  Continue statin at discharge  Dysphagia . Secondary to stroke . NPO . Cortrak placed - 10/07/19 . On tube feedings @ 50  . Speech on board  Cocaine abuse  UDS positive for cocaine  Cessation education will be provided   Other Stroke Risk Factors  Advanced age  Former Cigarette smoker, quit 5 yrs ago  ETOH use, advised to drink no more than 2 drink(s) a day  THC abuse - UDS:  THC POSITIVE, cessation education will be provided.  Family hx stroke (mother)  Coronary artery disease, MI  Other Active Problems  CKD stage II Cre 1.37->1.27->1.51->1.28->1.36->1.19  TG elevated at 476->471->106->88->181 (10/06/19)  Left eye conjunctivitis and subconjunctival hemorrhage - Ciprofloxacin drops started 5/21  Constipation - Dulcolax sppositories  Hyperglycemia -> SSI  Hospital day # 7  Patient continues to be critically ill for the last 24 hours, has continued to have high grade fever now on Abx, persistent hypertensive emergency needing cleviprex and adding new BP meds, leukocytosis, aspiration pneumonia, cerebral edema needing 3% saline bolus, needing frequent vital monitoring, pending blood culture and sputum culture. This patient is critically ill due to left MCA occlusion s/p EVT, ICH, SAH and SDH, cerebral edema, respiratory failure, cocaine abuse and at significant risk of neurological worsening, death form recurrent stroke, hematoma expansion, heart failure, seizure, respiratory failure, brain herniation. This patient's care requires constant monitoring of vital signs, hemodynamics, respiratory and cardiac monitoring, review of multiple databases, neurological assessment, discussion with family, other specialists and medical decision making of high complexity. I spent90minutes of neurocritical care time in the care of this patient.   Rosalin Hawking, MD  PhD Stroke Neurology 10/09/2019 9:35 AM  ADDENDUM:  I had long discussion with daughter Riley Wagner over the phone, updated pt current condition, treatment plan and potential prognosis, and answered all the questions. She expressed understanding and appreciation. I called pt son Riley Wagner first but he did not pick up the phone.  Rosalin Hawking, MD PhD Stroke Neurology 10/09/2019 7:18 PM   To contact Stroke Continuity provider, please refer to [email or web address] After hours, contact General Neurology

## 2019-10-09 NOTE — Evaluation (Signed)
Speech Language Pathology Evaluation Patient Details Name: Riley Wagner MRN: [id number] DOB: 29-Nov-1955 Today's Date: 10/09/2019 Time: [id number] SLP Time Calculation (min) (ACUTE ONLY): 12 min  Problem List:  Patient Active Problem List   Diagnosis Date Noted  . Hypertensive crisis   . Acute respiratory failure ([place])   . Status post stroke 10/02/2019  . Acute embolic stroke ([place]) 123456  . Essential hypertension 12/27/2013  . Other and unspecified hyperlipidemia 12/27/2013  . History of back surgery 12/27/2013  . Smoking 12/27/2013  . Chronic kidney disease, stage III (moderate) 06/14/2012  . Chronic combined systolic and diastolic heart failure ([place]) 04/11/2012   Past Medical History:  Past Medical History:  Diagnosis Date  . Hypertension   . MI (myocardial infarction) [facility])    Past Surgical History:  Past Surgical History:  Procedure Laterality Date  . BACK SURGERY     2004  . IR ANGIOGRAM FOLLOW UP STUDY  10/02/2019  . IR CT HEAD LTD  10/02/2019  . IR CT HEAD LTD  10/02/2019  . IR PERCUTANEOUS ART THROMBECTOMY/INFUSION INTRACRANIAL INC DIAG ANGIO  10/02/2019  . IR PTA INTRACRANIAL  10/02/2019  . IR TRANSCATH/EMBOLIZ  10/02/2019  . IR US GUIDE VASC ACCESS RIGHT  10/02/2019  . RADIOLOGY WITH ANESTHESIA N/A 10/02/2019   Procedure: IR WITH ANESTHESIA;  Surgeon: Radiologist, Medication, MD;  Location: [place];  Service: Radiology;  Laterality: N/A;   HPI:  64 yo male former smoker brought to ER on 5/16 with hypertensive crisis (BP 212/110), Rt sided weakness and aphasia.  Found to have Lt terminal ICA occlusion.  Intubated for airway protection 5/16-21.  Dx left MCA infarct due to left MI occlusion s/p unsuccessful L MCA thrombectomy; Right MCA infarcts in setting of cocaine use and right ICA and MCA large vessel stenoses.  CT 5/21 showed stable bilateral infarcts with midline shift of one cm.        Assessment / Plan / Recommendation Clinical Impression   Pt presents with  severe global aphasia.  He was unable to follow commands or answer simple yes/no questions.  He was nonverbal throughout the duration of today's evaluation and his respirations were audibly wet.  Pt made minimal attempts to engage with therapist and needed encouragement to keep his eyes open.  Pt would also swat at therapist's attempts to engage him in washing his face or completing oral care with suction toothette.  As a result, pt would benefit from skilled ST while inpatient in order to maximize functional independence and reduce burden of care prior to discharge.     SLP Assessment  SLP Recommendation/Assessment: Patient needs continued Speech Lanaguage Pathology Services SLP Visit Diagnosis: Aphasia (R47.01)    Follow Up Recommendations  Other (comment)(to be determined)    Frequency and Duration min 2x/week         SLP Evaluation Cognition  Overall Cognitive Status: Difficult to assess Arousal/Alertness: Lethargic Orientation Level: Other (comment)(nonverbal) Attention: Sustained Sustained Attention: Impaired Sustained Attention Impairment: Verbal basic;Functional basic Safety/Judgment: Impaired       Comprehension  Auditory Comprehension Overall Auditory Comprehension: Impaired Yes/No Questions: Impaired Basic Biographical Questions: 0-25% accurate Basic Immediate Environment Questions: 0-24% accurate Commands: Impaired One Step Basic Commands: 0-24% accurate    Expression Expression Primary Mode of Expression: Nonverbal - gestures Verbal Expression Overall Verbal Expression: Impaired Other Verbal Expression Comments: nonverbal   Oral / Motor  Oral Motor/Sensory Function Overall Oral Motor/Sensory Function: Other (comment)(unable to assess) Motor Speech Overall Motor Speech: Other (comment)(nonverbal)  GO                    Amberlea Spagnuolo, Selinda Orion 10/09/2019, 12:45 PM

## 2019-10-09 NOTE — Progress Notes (Signed)
SBP goal <180 per Dr.Agarwala

## 2019-10-09 NOTE — Progress Notes (Signed)
NAME:  Riley Wagner, MRN:  [id number], DOB:  October 24, 1955, LOS: 7 ADMISSION DATE:  10/02/2019, CONSULTATION DATE:  10/02/2019 REFERRING MD:  Dr. Debbrah Alar, neuro IR, CHIEF COMPLAINT:  Altered mental status   Brief History   64 yo male former smoker brought to ER with hypertensive crisis (BP 212/110), Rt sided weakness and aphasia.  Found to have Lt terminal ICA occlusion.  Intubated for airway protection.  Noted to have intraparenchymal bleed along posterior limb of IC as well as Rt sided bleed.  Had embolization of Lt ICA terminus.  Past Medical History  HTN, [place] [facility] Events   5/16 Admit. To IR for embolization of Left ICA. Back to ICU intubated. BP goal 120-140 5/17: still hypertensive in spite of maximum dose Cleviprex.  Continues to exhibit right-sided hemiparesis 5/18: CT brain showing worsening midline shift and cerebral edema.  Still having difficulty with blood pressure control, requiring titration up of oral regimen.  Of note was made DO NOT RESUSCITATE by family on 5/17 5/20 bp under control. Moving all ext except RUE. Spoke w/ family; leaning towards attempt at extubation and trach if fails.  Consults:  Neuro IR  Procedures:  ETT 5/16 >> Radial aline 5/16 >>  Significant Diagnostic Tests:   CT head 5/16 >> acute cytotoxic edema in Lt insula and frontal operculum, extensive chronic small vessel ischemic changes  Echo 5/17 >> Left Ventricle: Left ventricular ejection fraction, by estimation, is 55 to 60%. The left ventricle has normal function. The left ventricle has no regional wall motion abnormalities. The left ventricular internal cavity size was normal in size. There is mild concentric left ventricular hypertrophy. Left ventricular diastolic parameters are consistent with Grade II diastolic dysfunction (pseudonormalization). Elevated left ventricular end-diastolic pressure.  CT brain 5/18: small acute infarct in the right parietal cortex, no new hemorrhage,  left MCA infarct with progressive cytotoxic edema/swelling in 6 mm midline shift CT brain 5/21: 1. Left more than right cerebral infarcts without progression from brain MRI 2 days ago. Cytotoxic edema causes 1 cm of rightward shift.2. No interval hemorrhage. Micro Data:  SARS CoV2 PCR 5/16 >> negative Respiratory culture 5/18: nml flora  Antimicrobials:  Unasyn  Interim history/subjective:  Extubated 72 hours ago.  Maintaining saturation.  Occasional upper airway secretions which clear with coughing.  Able to initiate swallowing.  Keeping higher sodium goal for ongoing edema.  Objective   Blood pressure 134/89, pulse (!) 58, temperature (!) 101 F (38.3 C), temperature source Axillary, resp. rate (!) 26, height 5\' 7"  (1.702 m), weight 80.6 kg, SpO2 95 %.        Intake/Output Summary (Last 24 hours) at 10/09/2019 1817 Last data filed at 10/09/2019 1717 Gross per 24 hour  Intake 1600 ml  Output 1750 ml  Net -150 ml   Filed Weights   10/07/19 0500 10/08/19 0414 10/09/19 0500  Weight: 81.7 kg 81.6 kg 80.6 kg    Examination: General this is a 64 year old black male, Now on nasal cannula. Awake, not able to follow commands but is purposeful on left side. HENT right sided droop.  Reasonable control of oral secretions  Pulm scattered rhonchi, decreased Right base.  Card RRR w/ systolic murmur abd not tender Ext trace LE edema brisk CR pulses are palp Neuro eyes open spont right UE paresis. RLE 1/5/ left upper and lower w/ 5/5 strength    Resolved Hospital Problem list   non-anion anion gap metabolic acidosis  Assessment & Plan:  Acute hypoxic respiratory insufficiency with compromised airway. Atelectasis Appears to be able to manage secretions reasonably well. -Continue pulmonary toilet -Chest physiotherapy as needed.    Hypertensive emergency.  Requiring titration of clevidipine to maintain systolic blood pressure less than 160 Hx of HLD. -Oral medications optimized by  neurology today.  Follow response.  Lt ICA occlusion with intraparenchymal bleed along posterior limb of IC as well as Rt sided bleed. MRI brain showed evolving bilateral cerebral infarcts, large left MCA infarct, unchanged cytotoxic edema with 8 mm of rightward midline shift.  There is negative flow in the intracranial left ICA left MCA or left A1 segment -Prognosis for meaningful functional recovery poor -Continue mild hypernatremia for cerebral edema.  CKD2. Serum creatinine stable plan Trend chemistry   Therapeutic hypernatremia, mild hyperchloremic  Plan Holding free water given HT protocol Am chemistry   Hyperglycemia Plan Cont ssi   Best practice:  Diet: NPO-->start tubefeeds /17 DVT prophylaxis: SCDs & LMWH GI prophylaxis: protonix Mobility: bed rest Code Status: full code Disposition: Ready for stepdown soon.  Kipp Brood, MD [facility] ICU Physician [place]  Pager: [phone] Mobile: [phone] After hours: [phone].  10/09/2019, 6:17 PM      Pager # [phone] OR # [phone] if no answer

## 2019-10-10 LAB — CBC
HCT: 44 % (ref 39.0–52.0)
Hemoglobin: 14.2 g/dL (ref 13.0–17.0)
MCH: 30.7 pg (ref 26.0–34.0)
MCHC: 32.3 g/dL (ref 30.0–36.0)
MCV: 95 fL (ref 80.0–100.0)
Platelets: 280 10*3/uL (ref 150–400)
RBC: 4.63 MIL/uL (ref 4.22–5.81)
RDW: 15.5 % (ref 11.5–15.5)
WBC: 10.6 10*3/uL — ABNORMAL HIGH (ref 4.0–10.5)
nRBC: 0.2 % (ref 0.0–0.2)

## 2019-10-10 LAB — BASIC METABOLIC PANEL
Anion gap: 10 (ref 5–15)
BUN: 29 mg/dL — ABNORMAL HIGH (ref 8–23)
CO2: 22 mmol/L (ref 22–32)
Calcium: 9.1 mg/dL (ref 8.9–10.3)
Chloride: 118 mmol/L — ABNORMAL HIGH (ref 98–111)
Creatinine, Ser: 1.34 mg/dL — ABNORMAL HIGH (ref 0.61–1.24)
GFR calc Af Amer: >60 mL/min (ref >60)
GFR calc non Af Amer: 55 mL/min — ABNORMAL LOW (ref >60)
Glucose, Bld: 155 mg/dL — ABNORMAL HIGH (ref 70–99)
Potassium: 3.7 mmol/L (ref 3.5–5.1)
Sodium: 150 mmol/L — ABNORMAL HIGH (ref 135–145)

## 2019-10-10 LAB — GLUCOSE, CAPILLARY
Glucose-Capillary: 103 mg/dL — ABNORMAL HIGH (ref 70–99)
Glucose-Capillary: 142 mg/dL — ABNORMAL HIGH (ref 70–99)
Glucose-Capillary: 149 mg/dL — ABNORMAL HIGH (ref 70–99)
Glucose-Capillary: 133 mg/dL — ABNORMAL HIGH (ref 70–99)
Glucose-Capillary: 125 mg/dL — ABNORMAL HIGH (ref 70–99)

## 2019-10-10 LAB — SODIUM: Sodium: 154 mmol/L — ABNORMAL HIGH (ref 135–145)

## 2019-10-10 MED ORDER — IBUPROFEN 100 MG/5ML PO SUSP: 400.0000 mg | Freq: Four times a day (QID) | ORAL | Status: DC | PRN

## 2019-10-10 MED ORDER — IBUPROFEN 100 MG/5ML PO SUSP
400.0000 mg | Freq: Four times a day (QID) | ORAL | Status: DC | PRN
  Administered 2019-10-10 – 2019-10-19 (×4): 400 mg
  Filled 2019-10-10 (×5): qty 20

## 2019-10-10 NOTE — Plan of Care (Signed)
  Problem: Ischemic Stroke/TIA Tissue Perfusion: Goal: Complications of ischemic stroke/TIA will be minimized Outcome: Not Progressing   

## 2019-10-10 NOTE — Progress Notes (Signed)
STROKE TEAM PROGRESS NOTE   INTERVAL HISTORY Patient is still drowsy.  Is sitting up in bed.  Continues to have occasional upper airway secretions with difficulty clearing his airway.  He has been maintaining his oxygen saturations but appears to be struggling a little bit.  He continues to have high fever despite being on antibiotics.  He remains globally aphasic and does not follow any commands and has dense right hemiplegia. CT scan of the head done yesterday showed continued evolution of the large left MCA infarct with improvement in left to right shift.  No new hemorrhage  Vitals:   10/10/19 0800 10/10/19 0900 10/10/19 1000 10/10/19 1200  BP: (!) 192/101 (!) 163/91 115/89 (!) 148/80  Pulse: 65 (!) 55 64 (!) 59  Resp: (!) 30 (!) 28 (!) 35 (!) 29  Temp: (!) 102 F (38.9 C) (!) 100.9 F (38.3 C)  (!) 100.8 F (38.2 C)  TempSrc:  Axillary  Axillary  SpO2: 95% 96% 94% 96%  Weight:      Height:       CBC:  Recent Labs  Lab 10/09/19 0529 10/10/19 0726  WBC 10.4 10.6*  HGB 13.0 14.2  HCT 40.8 44.0  MCV 95.8 95.0  PLT [id number]   Basic Metabolic Panel:  Recent Labs  Lab 10/04/19 0245 10/04/19 0812 10/04/19 1700 10/04/19 1734 10/09/19 0529 10/09/19 1150 10/10/19 0144 10/10/19 0726  NA 155*   < >  --    < > 154*   < > 154* 150*  K 3.6  --   --    < > 3.9  --   --  3.7  CL 125*  --   --    < > 121*  --   --  118*  CO2 20*  --   --    < > 24  --   --  22  GLUCOSE 164*  --   --    < > 146*  --   --  155*  BUN 15  --   --    < > 30*  --   --  29*  CREATININE 1.27*  --   --    < > 1.23  --   --  1.34*  CALCIUM 8.8*  --   --    < > 8.8*  --   --  9.1  MG 2.4  --  2.3  --   --   --   --   --   PHOS 1.6*  --  4.2  --   --   --   --   --    < > = values in this interval not displayed.   Lipid Panel:     Component Value Date/Time   CHOL 176 10/03/2019 0216   CHOL 231 (H) 02/18/2018 1500   TRIG 115 10/09/2019 0529   HDL 30 (L) 10/03/2019 0216   HDL 41 02/18/2018 1500    CHOLHDL 5.9 10/03/2019 0216   VLDL UNABLE TO CALCULATE IF TRIGLYCERIDE OVER 400 mg/dL 10/03/2019 0216   LDLCALC UNABLE TO CALCULATE IF TRIGLYCERIDE OVER 400 mg/dL 10/03/2019 0216   LDLCALC 153 (H) 02/18/2018 1500   HgbA1c:  Lab Results  Component Value Date   HGBA1C 5.9 (H) 10/03/2019   Urine Drug Screen:     Component Value Date/Time   LABOPIA NONE DETECTED 10/02/2019 1309   COCAINSCRNUR POSITIVE (A) 10/02/2019 1309   LABBENZ NONE DETECTED 10/02/2019 1309   AMPHETMU NONE DETECTED  10/02/2019 1309   THCU POSITIVE (A) 10/02/2019 1309   LABBARB NONE DETECTED 10/02/2019 1309    Alcohol Level No results found for: ETH  IMAGING past 24 hours  No results found.  PHYSICAL EXAM     Temp:  [98.8 F (37.1 C)-102 F (38.9 C)] 100.8 F (38.2 C) (05/24 1200) Pulse Rate:  [52-81] 59 (05/24 1200) Resp:  [20-35] 29 (05/24 1200) BP: (91-192)/(52-112) 148/80 (05/24 1200) SpO2:  [93 %-97 %] 96 % (05/24 1200) Weight:  [78 kg] 78 kg (05/24 0500)  General - Well nourished, well developed middle-aged African-American male, left eye has subconjunctival hemorrhage and redness Ophthalmologic - fundi not visualized due to noncooperation.  Cardiovascular - Regular rate and rhythm.  Neuro -drowsy eyes open, but still global aphasia and not following commands. Eyes in left gaze preference position, not cross midline, able to track objects and sound on the left visual field, right neglect, right hemianopia, blinks more to threat on the left than on the right.  PERRL. Right facial droop, tongue protrusion not cooperative. Spontaneous and purposeful movement of LUE 4-/5 with finger grip 3/5 and LLE 3-/5, but no movement of RUE and RLE. Slight withdraw of RLE on pain stimulation. DTR 1+ and no babinski. Sensation, coordination not cooperative and gait not tested.   ASSESSMENT/PLAN Riley Wagner is a 64 y.o. male with history of HTN who fell the night prior to admission, presenting with right sided  weakness and aphasia. Taken to IR for terminus Left ICA occlusion.  Stroke:   L MCA infarcts due to left M1 occlusion s/p unsuccessful L MCA thrombectomy - L MCA infarct likely secondary to L ICA large vessel disease.  Stroke:   Right MCA infarcts in setting of cocaine use and right ICA and MCA large vessel stenoses     Code Stroke CT head cytotoxic edema L insula and frontal operculum. Likely old L parietal cortical and subcortical abnormality. ASPECTS 8    CTA head & neck L ICA occlusion. Widespread atherosclerosis: B CCA to ICA bifurcation R 40%, L 50% w/ extensive soft plaques at bifurcation and bulbs, L 30-40%. Severe B ICA siphon stenoses. R MCA and B PCA atherosclerosis w/ narrowing & irregularity.  CT perfusion 105 penumbra L MCA territory.   Cerebral angio - 10/02/19 - [name], MD - diffuse and severe intracranial atherosclerosis. Proximal L M1 occlusion. Complete recanalization L M1 w/ embotrap w/ poor distal flow d/t severe L ICA stenosis s/p angioplasty. L ICA terminus coil embolization w/ sparing L anterior choroidal.  CT head repeat 5/16 large L basal ganglia hemorrhage/contrast posterior SDH and SAH. Large cytotoxic edema L MCA w/ 47mm midline shift.   CT head 5/17 diminishing SAH contrast. L MCA infarct w/o significant mass effect  CT head 5/18 diminishing SAH. L MCA infarct increased edema, MLS 80mm. Right parietal small new infarct.   MRI evolving B cerebral infarcts including large L MCA infarct w/ edema and 105mm R midline shift, right parietal and frontal infarcts. No significant hemorrhage.  MRA No flow L carotid systeme following thrombectomy. Advanced intracranial atherosclerosis including moderate R ICA, mild R M1, severe proximal R M2 and severe L V4 stenoses.    CT repeat 5/21 L>R infarct w/o progression. 1cm R midline shift. No hemorrhage   CT repeat 5/23 - Continued interval evolution of left greater than right ischemic infarcts with unchanged  1cm of left-to-right shift.   2D Echo EF 55-60%   TG 471 and direct LDL  95.3, goal < 70  HgbA1c 5.9   UDS + cocaine and THC  lovenox for VTE prophylaxis  No antithrombotic prior to admission, now on ASA 325mg  daily. Not on DAPT in case of PEG or trach.   Therapy recommendations:  SNF   Disposition:  pending   Code status - full code now - discussed with nursing staff and Dr. Ashley Mariner   Cerebral Edema  Present on admission scan  Repeat CT head 5/17 large left MCA infarct with edema but no significant midline shift  CT 5/18 repeat L MCA infarct increased edema, MLS 64mm. Right parietal small new infarct.  MRI 5/19 - large L MCA infarct with MLS 74mm  CT 5/21 - stable b/l infarct with MLS 1cm  CT 5/23 - unchanged 10 mm of left-to-right shift.   Off 3% now  Goal Na 150-155  Na 150  S/p 23.4% saline x 1 -> 3% bolus x 1     Acute hypoxic respiratory failure with compromised airway Atelectasis   Intubated for IR, continued d/t compromised airway  CCM on board  On vent  On propofol -> off  CXR 5/21 - increased left lung base consolidation/atelectasis. pulm vasc congestion  Extubated Friday 5/21  Weak cough and increased secretion - high risk for re-intubation  Chest PT  NT suctioning  Less favorable respiratory status today  Keep in ICU for now  Fever  Pneumonia, likely aspiration  Tmax 102  Mild leukocytosis - 10.6  UA - 10/10/19 - 30 protein, o/w ok   CXR 10/07/19 - Increased atelectasis or consolidation at the LEFT lung base. Pulmonary vascular congestion.  CXR 10/08/19 - Poor inspiratory effort with left basilar atelectasis.  Sputum culture 5/18 normal respiratory flora  Blood culture 5/22  No growth x 2 days   CCM on board  PICC out over weekend d/t temp. Has PIV  Tylenol prn   Unasyn - 5/22>>5/26  Hypertensive Emergency  SBP > 220 on arrival  Home meds:  Amlodipine 10, coreg 3.125 bid, HCTZ 25, lisinopril 40  Off cleviprex  now  On amlodipine 10, clonidine 0.1 tid, hydralazine 100 q8, labetalol 300 Q8, lisinopril 40, hygroton 25  . SBP goal < 180 per Dr. Ashley Mariner . SBP 120s . Long-term BP goal normotensive  Hyperlipidemia  Home meds:  lipitor 40, resumed in hospital  Direct LDL 95.3, goal < 70  On lipitor 40  Continue statin at discharge  Dysphagia . Secondary to stroke . NPO . Cortrak placed - 10/07/19 . On tube feedings @ 50  . Speech on board . Possible PEG Thurs or Fri pending swallow  Cocaine abuse  UDS positive for cocaine  Cessation education will be provided   Other Stroke Risk Factors  Advanced age  Former Cigarette smoker, quit 5 yrs ago  ETOH use, advised to drink no more than 2 drink(s) a day  THC abuse - UDS:  THC POSITIVE, cessation education will be provided.  Family hx stroke (mother)  Coronary artery disease, MI  Other Active Problems  CKD stage II Cre 1.34  TG elevated at 181 (10/06/19)    Left eye conjunctivitis and subconjunctival hemorrhage - Ciprofloxacin drops started 5/21  Constipation - Dulcolax sppositories  Hyperglycemia -> SSI  Hospital day # 8  Patient continues to do neurologically poorly with global aphasia and dense right hemiplegia.  He has been extubated but looks like he is having silent aspiration with pneumonia and continues to have fever despite being started on antibiotics.  We will continue to monitor closely but may need reintubation soon.  Discussed with Dr. Lynetta Mare critical care medicine and spoken to family who want no cardiac compression but are agreeable to reintubation and tracheostomy and PEG tube if necessary.  I spoke to the patient's daughter over the phone and gave her an update and answered questions.This patient is critically ill and at significant risk of neurological worsening, death and care requires constant monitoring of vital signs, hemodynamics,respiratory and cardiac monitoring, extensive review of multiple databases,  frequent neurological assessment, discussion with family, other specialists and medical decision making of high complexity.I have made any additions or clarifications directly to the above note.This critical care time does not reflect procedure time, or teaching time or supervisory time of PA/NP/Med Resident etc but could involve care discussion time.  I spent 30 minutes of neurocritical care time  in the care of  this patient.     Antony Contras, MD  To contact Stroke Continuity provider, please refer to [email or web address] After hours, contact General Neurology

## 2019-10-10 NOTE — Progress Notes (Signed)
  Speech Language Pathology Treatment: Dysphagia;Cognitive-Linquistic  Patient Details Name: Riley Wagner MRN: [id number] DOB: [birth year] Today's Date: 10/10/2019 Time: [id number] SLP Time Calculation (min) (ACUTE ONLY): 12 min  Assessment / Plan / Recommendation Clinical Impression  Pt is still drowsy and resistant to POs. Hand-over-hand assist was needed for pt to follow any one-step commands. SLP was able to perform limited oral care but anytime an ice chip reached his lips he would turn his head away. Audible wetness was noted but pt did not cough throughout session. Cortrak remains appropriate at this time, but will f/u for potential to attempt more.    HPI HPI: 64 yo male former smoker brought to ER on 5/16 with hypertensive crisis (BP 212/110), Rt sided weakness and aphasia.  Found to have Lt terminal ICA occlusion.  Intubated for airway protection 5/16-21.  Dx left MCA infarct due to left MI occlusion s/p unsuccessful L MCA thrombectomy; Right MCA infarcts in setting of cocaine use and right ICA and MCA large vessel stenoses.  CT 5/21 showed stable bilateral infarcts with midline shift of one cm.           SLP Plan  Continue with current plan of care       Recommendations  Diet recommendations: NPO Medication Administration: Via alternative means                Oral Care Recommendations: Oral care QID Follow up Recommendations: Skilled Nursing facility SLP Visit Diagnosis: Aphasia (R47.01);Dysphagia, unspecified (R13.10) Plan: Continue with current plan of care       GO                 Osie Bond., M.A. [place] [facility] Pager [phone] Office [phone]  10/10/2019, 1:13 PM

## 2019-10-11 LAB — BASIC METABOLIC PANEL
Anion gap: 10 (ref 5–15)
BUN: 32 mg/dL — ABNORMAL HIGH (ref 8–23)
CO2: 22 mmol/L (ref 22–32)
Calcium: 9.1 mg/dL (ref 8.9–10.3)
Chloride: 118 mmol/L — ABNORMAL HIGH (ref 98–111)
Creatinine, Ser: 1.31 mg/dL — ABNORMAL HIGH (ref 0.61–1.24)
GFR calc Af Amer: >60 mL/min (ref >60)
GFR calc non Af Amer: 57 mL/min — ABNORMAL LOW (ref >60)
Glucose, Bld: 151 mg/dL — ABNORMAL HIGH (ref 70–99)
Potassium: 4.2 mmol/L (ref 3.5–5.1)
Sodium: 150 mmol/L — ABNORMAL HIGH (ref 135–145)

## 2019-10-11 LAB — GLUCOSE, CAPILLARY
Glucose-Capillary: 113 mg/dL — ABNORMAL HIGH (ref 70–99)
Glucose-Capillary: 153 mg/dL — ABNORMAL HIGH (ref 70–99)
Glucose-Capillary: 159 mg/dL — ABNORMAL HIGH (ref 70–99)
Glucose-Capillary: 159 mg/dL — ABNORMAL HIGH (ref 70–99)
Glucose-Capillary: 159 mg/dL — ABNORMAL HIGH (ref 70–99)
Glucose-Capillary: 165 mg/dL — ABNORMAL HIGH (ref 70–99)
Glucose-Capillary: 176 mg/dL — ABNORMAL HIGH (ref 70–99)

## 2019-10-11 LAB — CBC
HCT: 41.1 % (ref 39.0–52.0)
Hemoglobin: 13.5 g/dL (ref 13.0–17.0)
MCH: 31 pg (ref 26.0–34.0)
MCHC: 32.8 g/dL (ref 30.0–36.0)
MCV: 94.3 fL (ref 80.0–100.0)
Platelets: 337 10*3/uL (ref 150–400)
RBC: 4.36 MIL/uL (ref 4.22–5.81)
RDW: 15.5 % (ref 11.5–15.5)
WBC: 11.3 10*3/uL — ABNORMAL HIGH (ref 4.0–10.5)
nRBC: 0 % (ref 0.0–0.2)

## 2019-10-11 MED ORDER — POTASSIUM CHLORIDE 20 MEQ PO PACK: 20.0000 meq | PACK | Freq: Every day | ORAL | Status: DC

## 2019-10-11 NOTE — Progress Notes (Signed)
NAME:  Riley Wagner, MRN:  [id number], DOB:  [birth year], LOS: 9 ADMISSION DATE:  10/02/2019, CONSULTATION DATE:  10/02/2019 REFERRING MD:  Dr. Debbrah Alar, neuro IR, CHIEF COMPLAINT:  Altered mental status   Brief History   64 yo male former smoker brought to ER with hypertensive crisis (BP 212/110), Rt sided weakness and aphasia.  Found to have Lt terminal ICA occlusion.  Intubated for airway protection.  Noted to have intraparenchymal bleed along posterior limb of IC as well as Rt sided bleed.  Had embolization of Lt ICA terminus.  Past Medical History  HTN, [facility] Events   5/16 Admit. To IR for embolization of Left ICA. Back to ICU intubated. BP goal 120-140 5/17: still hypertensive in spite of maximum dose Cleviprex.  Continues to exhibit right-sided hemiparesis 5/18: CT brain showing worsening midline shift and cerebral edema.  Still having difficulty with blood pressure control, requiring titration up of oral regimen.  Of note was made DO NOT RESUSCITATE by family on 5/17 5/20 bp under control. Moving all ext except RUE. Spoke w/ family; leaning towards attempt at extubation and trach if fails.  Consults:  Neuro IR  Procedures:  ETT 5/16 >> Radial aline 5/16 >>  Significant Diagnostic Tests:   CT head 5/16 >> acute cytotoxic edema in Lt insula and frontal operculum, extensive chronic small vessel ischemic changes  Echo 5/17 >> Left Ventricle: Left ventricular ejection fraction, by estimation, is 55 to 60%. The left ventricle has normal function. The left ventricle has no regional wall motion abnormalities. The left ventricular internal cavity size was normal in size. There is mild concentric left ventricular hypertrophy. Left ventricular diastolic parameters are consistent with Grade II diastolic dysfunction (pseudonormalization). Elevated left ventricular end-diastolic pressure.  CT brain 5/18: small acute infarct in the right parietal cortex, no new hemorrhage,  left MCA infarct with progressive cytotoxic edema/swelling in 6 mm midline shift CT brain 5/21: 1. Left more than right cerebral infarcts without progression from brain MRI 2 days ago. Cytotoxic edema causes 1 cm of rightward shift.2. No interval hemorrhage. Micro Data:  SARS CoV2 PCR 5/16 >> negative Respiratory culture 5/18: nml flora  Antimicrobials:  Unasyn  Interim history/subjective:  Extubated on 5/21 .  Maintaining saturation.  Occasional upper airway secretions which clear with coughing.  Able to initiate swallowing.  Keeping higher sodium goal for ongoing edema.  Objective   Blood pressure (!) 198/111, pulse (!) 57, temperature 99.9 F (37.7 C), temperature source Axillary, resp. rate (!) 22, height 5\' 7"  (1.702 m), weight 78 kg, SpO2 99 %.        Intake/Output Summary (Last 24 hours) at 10/11/2019 1059 Last data filed at 10/11/2019 1000 Gross per 24 hour  Intake 700 ml  Output 2250 ml  Net -1550 ml   Filed Weights   10/08/19 0414 10/09/19 0500 10/10/19 0500  Weight: 81.6 kg 80.6 kg 78 kg    Examination: General this is a 64 year old black male, Now on nasal cannula. Awake, not able to follow commands but is purposeful on left side. HENT right sided droop.  Reasonable control of oral secretions  Pulm scattered rhonchi, decreased Right base.  Card RRR w/ systolic murmur abd not tender Ext trace LE edema brisk CR pulses are palp Neuro eyes open spont right UE paresis. RLE 1/5/ left upper and lower w/ 5/5 strength. Not following commands but attempted to phonate today.    Resolved Hospital Problem list   non-anion anion gap  metabolic acidosis  Assessment & Plan:   Acute hypoxic respiratory insufficiency with compromised airway. Atelectasis Appears to be able to manage secretions reasonably well. -Continue pulmonary toilet -Chest physiotherapy as needed.  -For PEG tube this week.   Hypertensive emergency.  Requiring titration of clevidipine to maintain  systolic blood pressure less than 160 Hx of HLD. -Oral medications optimized by neurology today.  Follow response.  Lt ICA occlusion with intraparenchymal bleed along posterior limb of IC as well as Rt sided bleed. MRI brain showed evolving bilateral cerebral infarcts, large left MCA infarct, unchanged cytotoxic edema with 8 mm of rightward midline shift.  There is negative flow in the intracranial left ICA left MCA or left A1 segment -Prognosis for meaningful functional recovery poor -Continue mild hypernatremia for cerebral edema.  CKD2. Serum creatinine stable plan Trend chemistry   Therapeutic hypernatremia, mild hyperchloremic  Plan Holding free water given HT protocol Am chemistry   Hyperglycemia Plan Cont ssi   Best practice:  Diet: NPO-->start tubefeeds /17 DVT prophylaxis: SCDs & LMWH GI prophylaxis: protonix Mobility: bed rest Code Status: full code Disposition: Ready for stepdown soon.  Kipp Brood, MD [facility] ICU Physician [place]  Pager: [phone] Mobile: [phone] After hours: [phone].  10/11/2019, 10:59 AM

## 2019-10-11 NOTE — Progress Notes (Signed)
Physical Therapy Treatment Patient Details Name: Riley Wagner MRN: [id number] DOB: September 02, 1955 Today's Date: 10/11/2019    History of Present Illness 64 y.o. male with a history of hypertension who was last known well prior to bed 5/15. He lost conciousness last night and fell, but refused transport with BP of 789 systolic and negative stroke screen by EMS. On 5/16 pt with R weakness and apahasia on awakening. Pt found to have large penumbra on CT and taken for emergent IR thrombectomy of L MCA followed by L ICA coil embolization 2/2 bleeding. Pt extubated on 5/21.    PT Comments    Patient progressing with sitting balance and focusing with hands together in midline briefly.  He was lethargic, but slightly more engaged with bed in chair position.  He pushed oral care attempts away when trying to have him assist with L UE.  Patient participating with cervical ROM with cues and assist and following commands about 30%.  PT to follow acutely.  Continue to recommend follow up SNF level rehab at d/c.   Follow Up Recommendations  SNF;Supervision/Assistance - 24 hour     Equipment Recommendations  Wheelchair (measurements PT);Wheelchair cushion (measurements PT);Hospital bed    Recommendations for Other Services       Precautions / Restrictions Precautions Precautions: Fall Precaution Comments: FY<101 systolically    Mobility  Bed Mobility Overal bed mobility: Needs Assistance       Supine to sit: Total assist     General bed mobility comments: used bed egress mode to bring pt up to sitting (RN preferred to using lift for OOB);  Transfers                    Ambulation/Gait                 Stairs             Wheelchair Mobility    Modified Rankin (Stroke Patients Only) Modified Rankin (Stroke Patients Only) Pre-Morbid Rankin Score: No symptoms Modified Rankin: Severe disability     Balance Overall balance assessment: Needs assistance Sitting-balance  support: Feet unsupported;Single extremity supported Sitting balance-Leahy Scale: Poor Sitting balance - Comments: leaning up from back of bed with bed in chair mode with mod A cues for focusing to midline, assist to hold flashlight in both hands w/ A for R hand and to try to get visual attention to it. sat away from back of bed about 1 minute Postural control: Left lateral lean                                  Cognition Arousal/Alertness: Lethargic Behavior During Therapy: Flat affect Overall Cognitive Status: Difficult to assess                     Current Attention Level: Focused   Following Commands: Follows one step commands inconsistently Safety/Judgement: Decreased awareness of safety;Decreased awareness of deficits   Problem Solving: Slow processing;Requires verbal cues;Requires tactile cues;Decreased initiation General Comments: followed commands with multimodal cues about 30% of the time      Exercises Other Exercises Other Exercises: R UE/LE PROM and heel cord stretch    General Comments General comments (skin integrity, edema, etc.): used oral care kit and attempt to have pt use with L hand but would not let me assist him to use in L hand so placed flashlight in his hand while  I used toothette for oral care; placed R hand on flashlight too and worked to engage pt to focus in midline; left in chair position with [facility] lowered some and bed alarm on      Pertinent Vitals/Pain Pain Assessment: Faces Faces Pain Scale: No hurt    Home Living                      Prior Function            PT Goals (current goals can now be found in the care plan section) Progress towards PT goals: Progressing toward goals    Frequency    Min 3X/week      PT Plan Current plan remains appropriate    Co-evaluation              AM-PAC PT "6 Clicks" Mobility   Outcome Measure  Help needed turning from your back to your side while in a flat  bed without using bedrails?: Total Help needed moving from lying on your back to sitting on the side of a flat bed without using bedrails?: Total Help needed moving to and from a bed to a chair (including a wheelchair)?: Total Help needed standing up from a chair using your arms (e.g., wheelchair or bedside chair)?: Total Help needed to walk in hospital room?: Total Help needed climbing 3-5 steps with a railing? : Total 6 Click Score: 6    End of Session Equipment Utilized During Treatment: Oxygen Activity Tolerance: Patient tolerated treatment well Patient left: in bed;with call bell/phone within reach;with bed alarm set(in chair position)   PT Visit Diagnosis: Other abnormalities of gait and mobility (R26.89);Muscle weakness (generalized) (M62.81);Hemiplegia and hemiparesis;Other symptoms and signs involving the nervous system (R29.898) Hemiplegia - Right/Left: Right Hemiplegia - caused by: Cerebral infarction     Time: 7670-1100 PT Time Calculation (min) (ACUTE ONLY): 28 min  Charges:  $Therapeutic Activity: 23-37 mins                     Riley Wagner, [place] [phone] 10/11/2019    Riley Wagner 10/11/2019, 3:38 PM

## 2019-10-11 NOTE — Progress Notes (Signed)
Pharmacy Antibiotic Note  Riley Wagner is a 64 y.o. male admitted on 10/02/2019 with concern for aspiration PNA.  Pharmacy has been consulted for Unasyn dosing.  Serum creatinine has been stable between 1.2-1.3. WBC remains WNLs at 11.3. Pt's fevers persist at 101.6.  Plan: - Continue Unasyn 3g IV every 6 hours - Will continue to follow renal function, culture results, LOT, and antibiotic de-escalation plans   Height: 5\' 7"  (170.2 cm) Weight: 78 kg (171 lb 15.3 oz) IBW/kg (Calculated) : 66.1  Temp (24hrs), Avg:100.3 F (37.9 C), Min:98.2 F (36.8 C), Max:101.6 F (38.7 C)  Recent Labs  Lab 10/07/19 0709 10/08/19 0418 10/09/19 0529 10/10/19 0726 10/11/19 0227  WBC 8.8 11.6* 10.4 10.6* 11.3*  CREATININE 1.36* 1.19 1.23 1.34* 1.31*    Estimated Creatinine Clearance: 52.6 mL/min (A) (by C-G formula based on SCr of 1.31 mg/dL (H)).    No Known Allergies  Antimicrobials this admission: Unasyn 5/22 >>  Dose adjustments this admission: n/a  Microbiology results: 5/16 MRSA PCR >> neg 5/16 COVID >> neg 5/18 trach aspirate >> NOF 5/22 BCx >> No growth x 2 days  Thank you for allowing pharmacy to be a part of this patient's care.  Sherren Kerns, PharmD PGY1 Acute Care Pharmacy Resident Clinical phone for 10/11/2019: [phone] 10/11/2019 10:44 AM   **Pharmacist phone directory can now be found on [email or web address] ([email or web address]).  Listed under [place].

## 2019-10-11 NOTE — Progress Notes (Signed)
**Note Riley-Identified via Obfuscation** STROKE TEAM PROGRESS NOTE   INTERVAL HISTORY Patient is sitting up in bed.  He remains in mild respiratory distress and slightly tachycardic but more alert than yesterday.  He is maintaining his oxygen saturation but does have occasional upper airway secretions which cleared with coughing.  He remains globally aphasic and not following commands with dense right hemiplegia.  Vital signs are stable.  White count is stable to slightly high at 11.3  Vitals:   10/11/19 0900 10/11/19 1000 10/11/19 1100 10/11/19 1200  BP: (!) 146/94 (!) 198/111 (!) 146/80 (!) 155/97  Pulse: (!) 53 (!) 57 67 60  Resp: 19 (!) 22 18 (!) 24  Temp:    99.5 F (37.5 C)  TempSrc:    Axillary  SpO2: 99% 99% 100% 100%  Weight:      Height:       CBC:  Recent Labs  Lab 10/10/19 0726 10/11/19 0227  WBC 10.6* 11.3*  HGB 14.2 13.5  HCT 44.0 41.1  MCV 95.0 94.3  PLT 280 [id number]   Basic Metabolic Panel:  Recent Labs  Lab 10/04/19 1700 10/04/19 1734 10/10/19 0726 10/11/19 0227  NA  --    < > 150* 150*  K  --    < > 3.7 4.2  CL  --    < > 118* 118*  CO2  --    < > 22 22  GLUCOSE  --    < > 155* 151*  BUN  --    < > 29* 32*  CREATININE  --    < > 1.34* 1.31*  CALCIUM  --    < > 9.1 9.1  MG 2.3  --   --   --   PHOS 4.2  --   --   --    < > = values in this interval not displayed.   IMAGING past 24 hours  No results found.  PHYSICAL EXAM      General - Well nourished, well developed middle-aged African-American male, left eye has subconjunctival hemorrhage and redness Ophthalmologic - fundi not visualized due to noncooperation.  Cardiovascular - Regular rate and rhythm.  Neuro - eyes open, but still global aphasia and not following commands. Eyes in left gaze preference position, not cross midline, able to track objects and sound on the left visual field, right neglect, right hemianopia, blinks more to threat on the left than on the right.  PERRL. Right facial droop, tongue protrusion not cooperative.  Spontaneous and purposeful movement of LUE 4-/5 with finger grip 3/5 and LLE 3-/5, but no movement of RUE and RLE. Slight withdraw of RLE on pain stimulation. DTR 1+ and no babinski. Sensation, coordination not cooperative and gait not tested.   ASSESSMENT/PLAN [name] with history of HTN who fell the night prior to admission, presenting with right sided weakness and aphasia. Taken to IR for terminus Left ICA occlusion.  Stroke:   L MCA infarcts due to left M1 occlusion s/p unsuccessful L MCA thrombectomy - L MCA infarct likely secondary to L ICA large vessel disease.  Stroke:   Right MCA infarcts in setting of cocaine use and right ICA and MCA large vessel stenoses     Code Stroke CT head cytotoxic edema L insula and frontal operculum. Likely old L parietal cortical and subcortical abnormality. ASPECTS 8    CTA head & neck L ICA occlusion. Widespread atherosclerosis: B CCA to ICA bifurcation R 40%, L 50% w/  extensive soft plaques at bifurcation and bulbs, L 30-40%. Severe B ICA siphon stenoses. R MCA and B PCA atherosclerosis w/ narrowing & irregularity.  CT perfusion 105 penumbra L MCA territory.   Cerebral angio - 10/02/19 - Riley Rosario [name], MD - diffuse and severe intracranial atherosclerosis. Proximal L M1 occlusion. Complete recanalization L M1 w/ embotrap w/ poor distal flow d/t severe L ICA stenosis s/p angioplasty. L ICA terminus coil embolization w/ sparing L anterior choroidal.  CT head repeat 5/16 large L basal ganglia hemorrhage/contrast posterior SDH and SAH. Large cytotoxic edema L MCA w/ 36mm midline shift.   CT head 5/17 diminishing SAH contrast. L MCA infarct w/o significant mass effect  CT head 5/18 diminishing SAH. L MCA infarct increased edema, MLS 54mm. Right parietal small new infarct.   MRI evolving B cerebral infarcts including large L MCA infarct w/ edema and 97mm R midline shift, right parietal and frontal infarcts. No significant  hemorrhage.  MRA No flow L carotid systeme following thrombectomy. Advanced intracranial atherosclerosis including moderate R ICA, mild R M1, severe proximal R M2 and severe L V4 stenoses.    CT repeat 5/21 L>R infarct w/o progression. 1cm R midline shift. No hemorrhage   CT repeat 5/23 - Continued interval evolution of left greater than right ischemic infarcts with unchanged 1cm of left-to-right shift.   2D Echo EF 55-60%   TG 471 and direct LDL 95.3, goal < 70  HgbA1c 5.9   UDS + cocaine and THC  lovenox for VTE prophylaxis  No antithrombotic prior to admission, now on ASA 325mg  daily. Not on DAPT in case of PEG or trach later this week.   Therapy recommendations:  SNF   Disposition:  pending   Code status - partial - DNI  Cerebral Edema  Present on admission scan  Repeat CT head 5/17 large left MCA infarct with edema but no significant midline shift  CT 5/18 repeat L MCA infarct increased edema, MLS 66mm. Right parietal small new infarct.  MRI 5/19 - large L MCA infarct with MLS 59mm  CT 5/21 - stable b/l infarct with MLS 1cm  CT 5/23 - unchanged 10 mm of left-to-right shift.   Off 3% now  Goal Na 150-155  Na 150  S/p 23.4% saline x 1 -> 3% bolus x 1     Acute hypoxic respiratory failure with compromised airway Atelectasis   Intubated for IR, continued d/t compromised airway  CCM on board  On vent  On propofol -> off  CXR 5/21 - increased left lung base consolidation/atelectasis. pulm vasc congestion  Extubated Friday 5/21  Weak cough and increased secretion - high risk for re-intubation  Chest PT  NT suctioning  Less favorable respiratory status today  Continue ICU for now  Fever  Pneumonia, likely aspiration  Tmax 102   Mild leukocytosis - 11.3  UA - 10/10/19 - 30 protein, o/w ok   CXR 10/07/19 - Increased atelectasis or consolidation at the LEFT lung base. Pulmonary vascular congestion.  CXR 10/08/19 - Poor inspiratory effort with  left basilar atelectasis.  Sputum culture 5/18 normal respiratory flora  Blood culture 5/22  No growth   PICC out over weekend d/t temp. Has PIV  Tylenol / motrin prn   Unasyn - 5/22>>    (5/26)  CCM on board  Hypertensive Emergency  SBP > 220 on arrival  Home meds:  Amlodipine 10, coreg 3.125 bid, HCTZ 25, lisinopril 40  Off cleviprex now  On amlodipine  10, clonidine 0.1 tid, hydralazine 100 q8, labetalol 300 Q8, lisinopril 40, hygroton 25  . SBP goal < 180 per Dr. Ashley Mariner . SBP 120s    . Long-term BP goal normotensive  Hyperlipidemia  Home meds:  lipitor 40, resumed in hospital  Direct LDL 95.3, goal < 70  On lipitor 40  Continue statin at discharge  Dysphagia . Secondary to stroke . NPO . Cortrak placed - 10/07/19 . On tube feedings @ 50  . Speech on board . Possible PEG Thurs or Fri   Cocaine abuse  UDS positive for cocaine  Cessation education will be provided   Other Stroke Risk Factors  Advanced age  Former Cigarette smoker, quit 5 yrs ago  ETOH use, advised to drink no more than 2 drink(s) a day  THC abuse - UDS:  THC POSITIVE, cessation education will be provided.  Family hx stroke (mother)  Coronary artery disease, MI  Other Active Problems  CKD stage II Cre 1.31  TG elevated at 181 (10/06/19)    Left eye conjunctivitis and subconjunctival hemorrhage - Ciprofloxacin drops started 5/21  Constipation - Dulcolax sppositories  Hyperglycemia -> SSI  KLC decreased from 40-10 daily given K 3.7->4.2  Hospital day # 9  Patient continues to maintain his oxygen sats despite significant aphasia and dysphagia from his stroke.  Continue management of respiratory status as per critical care team and he remains at risk for worsening respiratory failure and reintubation and needing ventilatory support..  If he remains stable may consider putting in a PEG tube in the next 2 to 3 days as his ability to swallow safely is unlikely to improve soon  enough given the large extent of his stroke and significant dysphagia which persist.  No family available at the bedside for discussion.  Discussed with Dr. Kipp Brood critical care medicine  This patient is critically ill and at significant risk of neurological worsening, death and care requires constant monitoring of vital signs, hemodynamics,respiratory and cardiac monitoring, extensive review of multiple databases, frequent neurological assessment, discussion with family, other specialists and medical decision making of high complexity.I have made any additions or clarifications directly to the above note.This critical care time does not reflect procedure time, or teaching time or supervisory time of PA/NP/Med Resident etc but could involve care discussion time. I spent 30 minutes of neurocritical care time  in the care of  this patient.     Antony Contras, MD  To contact Stroke Continuity provider, please refer to [email or web address] After hours, contact General Neurology

## 2019-10-12 LAB — BASIC METABOLIC PANEL
Anion gap: 10 (ref 5–15)
BUN: 36 mg/dL — ABNORMAL HIGH (ref 8–23)
CO2: 23 mmol/L (ref 22–32)
Calcium: 9.1 mg/dL (ref 8.9–10.3)
Chloride: 112 mmol/L — ABNORMAL HIGH (ref 98–111)
Creatinine, Ser: 1.43 mg/dL — ABNORMAL HIGH (ref 0.61–1.24)
GFR calc Af Amer: 59 mL/min — ABNORMAL LOW (ref >60)
GFR calc non Af Amer: 51 mL/min — ABNORMAL LOW (ref >60)
Glucose, Bld: 168 mg/dL — ABNORMAL HIGH (ref 70–99)
Potassium: 4.6 mmol/L (ref 3.5–5.1)
Sodium: 145 mmol/L (ref 135–145)

## 2019-10-12 LAB — CBC
HCT: 42.8 % (ref 39.0–52.0)
Hemoglobin: 13.7 g/dL (ref 13.0–17.0)
MCH: 30.4 pg (ref 26.0–34.0)
MCHC: 32 g/dL (ref 30.0–36.0)
MCV: 94.9 fL (ref 80.0–100.0)
Platelets: 375 10*3/uL (ref 150–400)
RBC: 4.51 MIL/uL (ref 4.22–5.81)
RDW: 15.3 % (ref 11.5–15.5)
WBC: 12.3 10*3/uL — ABNORMAL HIGH (ref 4.0–10.5)
nRBC: 0 % (ref 0.0–0.2)

## 2019-10-12 LAB — GLUCOSE, CAPILLARY
Glucose-Capillary: 166 mg/dL — ABNORMAL HIGH (ref 70–99)
Glucose-Capillary: 168 mg/dL — ABNORMAL HIGH (ref 70–99)
Glucose-Capillary: 156 mg/dL — ABNORMAL HIGH (ref 70–99)
Glucose-Capillary: 173 mg/dL — ABNORMAL HIGH (ref 70–99)
Glucose-Capillary: 149 mg/dL — ABNORMAL HIGH (ref 70–99)

## 2019-10-12 NOTE — NC FL2 (Addendum)
[place] LEVEL OF CARE SCREENING TOOL     IDENTIFICATION  Patient Name: Riley Wagner Birthdate: December 25, 1955 Sex: male Admission Date (Current Location): 10/02/2019  [facility] and [place] Number:  Herbalist and Address:  [facility], [place] [address], [place], [place] [address]      Provider Number: [id number]  Attending Physician Name and Address:  Garvin Fila, MD  Relative Name and Phone Number:  Rexton, Greulich (Daughter) [phone] [facility])    Current Level of Care: Hospital Recommended Level of Care: [place] Prior Approval Number:    Date Approved/Denied:   PASRR Number: [id number]  Discharge Plan: SNF    Current Diagnoses: Patient Active Problem List   Diagnosis Date Noted  . Hypertensive crisis   . Acute respiratory failure ([place])   . Status post stroke 10/02/2019  . Acute embolic stroke ([place]) 59/56/3875  . Essential hypertension 12/27/2013  . Other and unspecified hyperlipidemia 12/27/2013  . History of back surgery 12/27/2013  . Smoking 12/27/2013  . Chronic kidney disease, stage III (moderate) 06/14/2012  . Chronic combined systolic and diastolic heart failure (HCC) 04/11/2012    Orientation RESPIRATION BLADDER Height & Weight     Self  O2(2l) External catheter Weight: 172 lb 6.4 oz (78.2 kg) Height:  5' 7"  (170.2 cm)  BEHAVIORAL SYMPTOMS/MOOD NEUROLOGICAL BOWEL NUTRITION STATUS  (none) (none) Incontinent Feeding tube(PEG Tube)  AMBULATORY STATUS COMMUNICATION OF NEEDS Skin   Total Care Does not communicate Normal                       Personal Care Assistance Level of Assistance  Total care       Total Care Assistance: Maximum assistance   Functional Limitations Info  Sight, Speech, Hearing Sight Info: Adequate Hearing Info: Adequate Speech Info: Adequate    SPECIAL CARE FACTORS FREQUENCY  PT (By licensed PT), OT (By licensed OT)     PT Frequency: 5x/week OT  Frequency: 5x/week            Contractures Contractures Info: Not present    Additional Factors Info  Code Status, Allergies Code Status Info: PARTIAL Allergies Info: NKA           Current Medications (10/12/2019):  This is the current hospital active medication list Current Facility-Administered Medications  Medication Dose Route Frequency Provider Last Rate Last Admin  . acetaminophen (TYLENOL) tablet 650 mg  650 mg Per Tube Q4H PRN Rosalin Hawking, MD   650 mg at 10/09/19 0017   Or  . acetaminophen (TYLENOL) 160 MG/5ML solution 650 mg  650 mg Per Tube Q4H PRN Rosalin Hawking, MD   650 mg at 10/12/19 0820   Or  . acetaminophen (TYLENOL) suppository 650 mg  650 mg Rectal Q4H PRN Rosalin Hawking, MD      . amLODipine (NORVASC) tablet 10 mg  10 mg Per Tube Daily Chesley Mires, MD   10 mg at 10/12/19 1034  . Ampicillin-Sulbactam (UNASYN) 3 g in sodium chloride 0.9 % 100 mL IVPB  3 g Intravenous Q6H Agarwala, Ravi, MD 200 mL/hr at 10/12/19 1232 3 g at 10/12/19 1232  . aspirin tablet 325 mg  325 mg Per Tube Daily Rosalin Hawking, MD   325 mg at 10/12/19 1035  . atorvastatin (LIPITOR) tablet 40 mg  40 mg Per Tube Daily Chesley Mires, MD   40 mg at 10/12/19 1034  . chlorhexidine gluconate (MEDLINE KIT) (PERIDEX) 0.12 % solution  15 mL  15 mL Mouth Rinse BID Chesley Mires, MD   15 mL at 10/12/19 0800  . Chlorhexidine Gluconate Cloth 2 % PADS 6 each  6 each Topical Daily Greta Doom, MD   6 each at 10/12/19 0600  . chlorthalidone (HYGROTON) tablet 25 mg  25 mg Per Tube Daily Rosalin Hawking, MD   25 mg at 10/12/19 1035  . ciprofloxacin (CILOXAN) 0.3 % ophthalmic solution 1 drop  1 drop Left Eye Q6H Rosalin Hawking, MD   1 drop at 10/12/19 0601  . cloNIDine (CATAPRES) tablet 0.1 mg  0.1 mg Per Tube TID Rosalin Hawking, MD   0.1 mg at 10/12/19 1034  . docusate (COLACE) 50 MG/5ML liquid 100 mg  100 mg Per Tube BID Chesley Mires, MD   100 mg at 10/11/19 0946  . enoxaparin (LOVENOX) injection 40 mg  40 mg  Subcutaneous Q24H Rosalin Hawking, MD   40 mg at 10/12/19 1231  . feeding supplement (OSMOLITE 1.5 CAL) liquid 1,000 mL  1,000 mL Per Tube Continuous Kipp Brood, MD 50 mL/hr at 10/12/19 0603 1,000 mL at 10/12/19 0603  . feeding supplement (PRO-STAT SUGAR FREE 64) liquid 30 mL  30 mL Per Tube BID Agarwala, Einar Grad, MD   30 mL at 10/12/19 1035  . hydrALAZINE (APRESOLINE) tablet 100 mg  100 mg Per Tube Lina Sar, MD   100 mg at 10/12/19 0601  . ibuprofen (ADVIL) 100 MG/5ML suspension 400 mg  400 mg Per Tube Q6H PRN Garvin Fila, MD   400 mg at 10/11/19 2036  . labetalol (NORMODYNE) injection 10-20 mg  10-20 mg Intravenous Q10 min PRN Rosalin Hawking, MD   20 mg at 10/10/19 0830  . labetalol (NORMODYNE) tablet 300 mg  300 mg Per Tube Lina Sar, MD   300 mg at 10/12/19 0601  . lisinopril (ZESTRIL) tablet 40 mg  40 mg Per Tube Daily Kipp Brood, MD   40 mg at 10/12/19 1035  . MEDLINE mouth rinse  15 mL Mouth Rinse 10 times per day Chesley Mires, MD   15 mL at 10/12/19 1044  . multivitamin with minerals tablet 1 tablet  1 tablet Per Tube Daily Erick Colace, NP   1 tablet at 10/12/19 1034  . polyethylene glycol (MIRALAX / GLYCOLAX) packet 17 g  17 g Per Tube Daily Chesley Mires, MD   17 g at 10/08/19 1011  . senna (SENOKOT) tablet 8.6 mg  1 tablet Per Tube BID Rolla Flatten, RPH   8.6 mg at 10/08/19 2111  . sodium chloride flush (NS) 0.9 % injection 10-40 mL  10-40 mL Intracatheter PRN Rosalin Hawking, MD         Discharge Medications: Please see discharge summary for a list of discharge medications.  Relevant Imaging Results:  Relevant Lab Results:   Additional Information [id number]  Bethann [place], LCSW   I have personally obtained history,examined this patient, reviewed notes, independently viewed imaging studies, participated in medical decision making and plan of care.ROS completed by me personally and pertinent positives fully documented  I have made any additions or  clarifications directly to the above note. Agree with note above.   Antony Contras, MD Medical Director [facility] Pager: [phone] 10/13/2019 7:13 AM

## 2019-10-12 NOTE — Progress Notes (Signed)
h  NAME:  Riley Wagner, MRN:  [id number], DOB:  07/27/1955, LOS: 10 ADMISSION DATE:  10/02/2019, CONSULTATION DATE:  10/02/2019 REFERRING MD:  Dr. Debbrah Alar, neuro IR, CHIEF COMPLAINT:  Altered mental status   Brief History   64 yo male former smoker brought to ER with hypertensive crisis (BP 212/110), Rt sided weakness and aphasia.  Found to have Lt terminal ICA occlusion.  Intubated for airway protection.  Noted to have intraparenchymal bleed along posterior limb of IC as well as Rt sided bleed.  Had embolization of Lt ICA terminus.  Past Medical History  HTN, [place] Hospital Events   5/16 Admit. To IR for embolization of Left ICA. Back to ICU intubated. BP goal 120-140 5/17: still hypertensive in spite of maximum dose Cleviprex.  Continues to exhibit right-sided hemiparesis 5/18: CT brain showing worsening midline shift and cerebral edema.  Still having difficulty with blood pressure control, requiring titration up of oral regimen.  Of note was made DO NOT RESUSCITATE by family on 5/17 5/20 bp under control. Moving all ext except RUE. Spoke w/ family; leaning towards attempt at extubation and trach if fails.  Consults:  Neuro IR  Procedures:  ETT 5/16 >> Radial aline 5/16 >>  Significant Diagnostic Tests:   CT head 5/16 >> acute cytotoxic edema in Lt insula and frontal operculum, extensive chronic small vessel ischemic changes  Echo 5/17 >> Left Ventricle: Left ventricular ejection fraction, by estimation, is 55 to 60%. The left ventricle has normal function. The left ventricle has no regional wall motion abnormalities. The left ventricular internal cavity size was normal in size. There is mild concentric left ventricular hypertrophy. Left ventricular diastolic parameters are consistent with Grade II diastolic dysfunction (pseudonormalization). Elevated left ventricular end-diastolic pressure.  CT brain 5/18: small acute infarct in the right parietal cortex, no new hemorrhage,  left MCA infarct with progressive cytotoxic edema/swelling in 6 mm midline shift CT brain 5/21: 1. Left more than right cerebral infarcts without progression from brain MRI 2 days ago. Cytotoxic edema causes 1 cm of rightward shift.2. No interval hemorrhage. Micro Data:  SARS CoV2 PCR 5/16 >> negative Respiratory culture 5/18: nml flora  Antimicrobials:  Unasyn  Interim history/subjective:  Extubated on 5/21 .  Maintaining saturation.  Occasional upper airway secretions which clear with coughing.  Able to initiate swallowing.  Still aphasia.  Objective   Blood pressure 139/82, pulse 60, temperature 99.4 F (37.4 C), temperature source Oral, resp. rate (!) 27, height 5\' 7"  (1.702 m), weight 78.2 kg, SpO2 98 %.        Intake/Output Summary (Last 24 hours) at 10/12/2019 0837 Last data filed at 10/12/2019 0603 Gross per 24 hour  Intake 1302.64 ml  Output 2775 ml  Net -1472.36 ml   Filed Weights   10/09/19 0500 10/10/19 0500 10/12/19 0500  Weight: 80.6 kg 78 kg 78.2 kg    Examination: General this is a 64 year old black male, Now on nasal cannula. Awake, not able to follow commands but is purposeful on left side. HENT right sided droop.  Reasonable control of oral secretions  Pulm scattered rhonchi, decreased Right base.  Card RRR w/ systolic murmur abd not tender Ext trace LE edema brisk CR pulses are palp Neuro eyes open spont right UE paresis. RLE 1/5/ left upper and lower w/ 5/5 strength. Not following commands but attempted to phonate today.    Resolved Hospital Problem list   non-anion anion gap metabolic acidosis  Assessment & Plan:  Acute hypoxic respiratory insufficiency with compromised airway. Atelectasis Appears to be able to manage secretions reasonably well. -Continue pulmonary toilet -Chest physiotherapy as needed.  -For PEG tube this week.   Hypertensive emergency.  Requiring titration of clevidipine to maintain systolic blood pressure less than 160 Hx  of HLD. -Oral medications optimized by neurology today.  Follow response.  Lt ICA occlusion with intraparenchymal bleed along posterior limb of IC as well as Rt sided bleed. MRI brain showed evolving bilateral cerebral infarcts, large left MCA infarct, unchanged cytotoxic edema with 8 mm of rightward midline shift.  There is negative flow in the intracranial left ICA left MCA or left A1 segment -Prognosis for meaningful functional recovery poor -Continue mild hypernatremia for cerebral edema.  CKD2. Serum creatinine stable plan Trend chemistry   Therapeutic hypernatremia, mild hyperchloremic  Plan Holding free water given HT protocol Am chemistry   Hyperglycemia Plan Cont ssi   Best practice:  Diet: NPO-->start tubefeeds /17 DVT prophylaxis: SCDs & LMWH GI prophylaxis: protonix Mobility: bed rest Code Status: full code Disposition: Ready for stepdown soon.  Kipp Brood, MD [facility] ICU Physician [place]  Pager: [phone] Mobile: [phone] After hours: [phone].  10/12/2019, 8:37 AM

## 2019-10-12 NOTE — Consult Note (Signed)
[place] 08-22-1955  [id number].    Requesting MD: Dr. Lynetta Mare Chief Complaint/Reason for Consult: Dysphagia   HPI: Riley Wagner is a 64 y.o. male with a history of HTN, HLD, cocaine abuse, prior tobacco use who presented on 5/16 with right sided weakness, aphasia and was found to have HTN emergency with a L MCA infarcts due to left M1 occlusion as well as right MCA infarct. Patient has suffered from dysphagia 2/2 to this. We were asked to see for PEG placement. Family at bedside. No prior abdominal surgery reported. He is currently on abx for aspiration PNA.   ROS: Review of Systems  Unable to perform ROS: Patient nonverbal    Family History  Problem Relation Age of Onset  . Hypertension Mother   . Diabetes Mother   . Stroke Mother     Past Medical History:  Diagnosis Date  . Hypertension   . MI (myocardial infarction) [facility])     Past Surgical History:  Procedure Laterality Date  . BACK SURGERY     2004  . IR ANGIOGRAM FOLLOW UP STUDY  10/02/2019  . IR CT HEAD LTD  10/02/2019  . IR CT HEAD LTD  10/02/2019  . IR PERCUTANEOUS ART THROMBECTOMY/INFUSION INTRACRANIAL INC DIAG ANGIO  10/02/2019  . IR PTA INTRACRANIAL  10/02/2019  . IR TRANSCATH/EMBOLIZ  10/02/2019  . IR US GUIDE VASC ACCESS RIGHT  10/02/2019  . RADIOLOGY WITH ANESTHESIA N/A 10/02/2019   Procedure: IR WITH ANESTHESIA;  Surgeon: Radiologist, Medication, MD;  Location: [place];  Service: Radiology;  Laterality: N/A;    Social History:  reports that he quit smoking about 5 years ago. His smoking use included cigarettes. He has a 5.00 pack-year smoking history. He has never used smokeless tobacco. He reports current alcohol use. He reports that he does not use drugs.  Allergies: No Known Allergies  Medications Prior to Admission  Medication Sig Dispense Refill  . lisinopril (ZESTRIL) 40 MG tablet Take 1 tablet (40 mg total) by mouth daily. To help lower blood pressure (Patient taking differently: Take 40 mg by  mouth daily. ) 90 tablet 1  . acetaminophen (TYLENOL) 500 MG tablet Take 2 tablets (1,000 mg total) by mouth every 6 (six) hours as needed. (Patient not taking: Reported on 10/02/2019) 30 tablet 0  . amLODipine (NORVASC) 10 MG tablet Take one pill one time daily to help lower blood pressure (Patient not taking: Reported on 10/02/2019) 90 tablet 1  . aspirin EC 81 MG tablet Take 1 tablet (81 mg total) by mouth daily. (Patient not taking: Reported on 10/02/2019) 90 tablet 1  . atorvastatin (LIPITOR) 40 MG tablet Take 1 tablet (40 mg total) by mouth daily. To help lower cholesterol (Patient not taking: Reported on 10/02/2019) 90 tablet 3  . carvedilol (COREG) 3.125 MG tablet TAKE ONE TABLET BY MOUTH TWICE DAY to help lower blood pressure (Patient not taking: Reported on 10/02/2019) 180 tablet 1  . cyclobenzaprine (FLEXERIL) 5 MG tablet Take 1 tablet (5 mg total) by mouth every 8 (eight) hours as needed for muscle spasms. (Patient not taking: Reported on 10/02/2019) 30 tablet 1  . hydrochlorothiazide (HYDRODIURIL) 25 MG tablet Take 1 tablet (25 mg total) by mouth daily. To help lower blood pressure (Patient not taking: Reported on 10/02/2019) 90 tablet 1     Physical Exam: Blood pressure (!) 142/85, pulse 60, temperature 99.4 F (37.4 C), temperature source Axillary, resp. rate (!) 24, height 5\' 7"  (1.702 m), weight 78.2  kg, SpO2 95 %. General: WD/WN AA male who is laying in bed in NAD HEENT: head is normocephalic, atraumatic.  Sclera are noninjected.  PERRL.  Ears and nose without any masses or lesions.  Mouth is pink and moist. Dentition fair Heart: regular, rate, and rhythm. No obvious present on my exam.  Lungs: On o2. Rhonchi noted throughout. Distant breath sounds at the bases. Respiratory effort nonlabored Abd: Soft, NT/ND, +BS, no masses, hernias, or organomegaly. No obvious prior abdominal scars.  MS: no BUE/BLE edema, calves soft and nontender Skin: warm and dry with no masses, lesions, or  rashes Psych: Unable to assess  Neuro: Eyes open spontaneously. Does not follow commands for extremity movement or participate in strength assessment. Not following commands.   Results for orders placed or performed during the hospital encounter of 10/02/19 (from the past 48 hour(s))  Glucose, capillary     Status: Abnormal   Collection Time: 10/10/19  8:07 PM  Result Value Ref Range   Glucose-Capillary 125 (H) 70 - 99 mg/dL    Comment: Glucose reference range applies only to samples taken after fasting for at least 8 hours.   Comment 1 Notify RN    Comment 2 Document in Chart   Glucose, capillary     Status: Abnormal   Collection Time: 10/11/19 12:00 AM  Result Value Ref Range   Glucose-Capillary 165 (H) 70 - 99 mg/dL    Comment: Glucose reference range applies only to samples taken after fasting for at least 8 hours.   Comment 1 Notify RN    Comment 2 Document in Chart   CBC     Status: Abnormal   Collection Time: 10/11/19  2:27 AM  Result Value Ref Range   WBC 11.3 (H) 4.0 - 10.5 K/uL   RBC 4.36 4.22 - 5.81 MIL/uL   Hemoglobin 13.5 13.0 - 17.0 g/dL   HCT 41.1 39.0 - 52.0 %   MCV 94.3 80.0 - 100.0 fL   MCH 31.0 26.0 - 34.0 pg   MCHC 32.8 30.0 - 36.0 g/dL   RDW 15.5 11.5 - 15.5 %   Platelets 337 150 - 400 K/uL   nRBC 0.0 0.0 - 0.2 %    Comment: Performed at [facility], [place] [address], [place], [place] [id number]  Basic metabolic panel     Status: Abnormal   Collection Time: 10/11/19  2:27 AM  Result Value Ref Range   Sodium 150 (H) 135 - 145 mmol/L   Potassium 4.2 3.5 - 5.1 mmol/L   Chloride 118 (H) 98 - 111 mmol/L   CO2 22 22 - 32 mmol/L   Glucose, Bld 151 (H) 70 - 99 mg/dL    Comment: Glucose reference range applies only to samples taken after fasting for at least 8 hours.   BUN 32 (H) 8 - 23 mg/dL   Creatinine, Ser 1.31 (H) 0.61 - 1.24 mg/dL   Calcium 9.1 8.9 - 10.3 mg/dL   GFR calc non Af Amer 57 (L) >60 mL/min   GFR calc Af Amer >60 >60 mL/min   Anion gap  10 5 - 15    Comment: Performed at [facility] [address], [place], [place] [address]  Glucose, capillary     Status: Abnormal   Collection Time: 10/11/19  4:13 AM  Result Value Ref Range   Glucose-Capillary 159 (H) 70 - 99 mg/dL    Comment: Glucose reference range applies only to samples taken after fasting for  at least 8 hours.   Comment 1 Notify RN    Comment 2 Document in Chart   Glucose, capillary     Status: Abnormal   Collection Time: 10/11/19  8:11 AM  Result Value Ref Range   Glucose-Capillary 159 (H) 70 - 99 mg/dL    Comment: Glucose reference range applies only to samples taken after fasting for at least 8 hours.  Glucose, capillary     Status: Abnormal   Collection Time: 10/11/19 11:40 AM  Result Value Ref Range   Glucose-Capillary 113 (H) 70 - 99 mg/dL    Comment: Glucose reference range applies only to samples taken after fasting for at least 8 hours.   Comment 1 Notify RN    Comment 2 Document in Chart   Glucose, capillary     Status: Abnormal   Collection Time: 10/11/19  3:17 PM  Result Value Ref Range   Glucose-Capillary 153 (H) 70 - 99 mg/dL    Comment: Glucose reference range applies only to samples taken after fasting for at least 8 hours.   Comment 1 Notify RN    Comment 2 Document in Chart   Glucose, capillary     Status: Abnormal   Collection Time: 10/11/19  7:07 PM  Result Value Ref Range   Glucose-Capillary 159 (H) 70 - 99 mg/dL    Comment: Glucose reference range applies only to samples taken after fasting for at least 8 hours.  Glucose, capillary     Status: Abnormal   Collection Time: 10/11/19 11:36 PM  Result Value Ref Range   Glucose-Capillary 176 (H) 70 - 99 mg/dL    Comment: Glucose reference range applies only to samples taken after fasting for at least 8 hours.  Glucose, capillary     Status: Abnormal   Collection Time: 10/12/19  3:03 AM  Result Value Ref Range   Glucose-Capillary 166 (H) 70 - 99 mg/dL    Comment: Glucose  reference range applies only to samples taken after fasting for at least 8 hours.  CBC     Status: Abnormal   Collection Time: 10/12/19  6:11 AM  Result Value Ref Range   WBC 12.3 (H) 4.0 - 10.5 K/uL   RBC 4.51 4.22 - 5.81 MIL/uL   Hemoglobin 13.7 13.0 - 17.0 g/dL   HCT 42.8 39.0 - 52.0 %   MCV 94.9 80.0 - 100.0 fL   MCH 30.4 26.0 - 34.0 pg   MCHC 32.0 30.0 - 36.0 g/dL   RDW 15.3 11.5 - 15.5 %   Platelets 375 150 - 400 K/uL   nRBC 0.0 0.0 - 0.2 %    Comment: Performed at [facility], [place] [address], [place], [place] [id number]  Basic metabolic panel     Status: Abnormal   Collection Time: 10/12/19  6:11 AM  Result Value Ref Range   Sodium 145 135 - 145 mmol/L   Potassium 4.6 3.5 - 5.1 mmol/L   Chloride 112 (H) 98 - 111 mmol/L   CO2 23 22 - 32 mmol/L   Glucose, Bld 168 (H) 70 - 99 mg/dL    Comment: Glucose reference range applies only to samples taken after fasting for at least 8 hours.   BUN 36 (H) 8 - 23 mg/dL   Creatinine, Ser 1.43 (H) 0.61 - 1.24 mg/dL   Calcium 9.1 8.9 - 10.3 mg/dL   GFR calc non Af Amer 51 (L) >60 mL/min   GFR calc Af Amer 59 (L) >60 mL/min  Anion gap 10 5 - 15    Comment: Performed at [facility] [address] [place], [place] [address]  Glucose, capillary     Status: Abnormal   Collection Time: 10/12/19  7:37 AM  Result Value Ref Range   Glucose-Capillary 168 (H) 70 - 99 mg/dL    Comment: Glucose reference range applies only to samples taken after fasting for at least 8 hours.   Comment 1 Notify RN    Comment 2 Document in Chart   Glucose, capillary     Status: Abnormal   Collection Time: 10/12/19 11:50 AM  Result Value Ref Range   Glucose-Capillary 156 (H) 70 - 99 mg/dL    Comment: Glucose reference range applies only to samples taken after fasting for at least 8 hours.   Comment 1 Notify RN    Comment 2 Document in Chart   Glucose, capillary     Status: Abnormal   Collection Time: 10/12/19  3:45 PM  Result Value Ref Range    Glucose-Capillary 173 (H) 70 - 99 mg/dL    Comment: Glucose reference range applies only to samples taken after fasting for at least 8 hours.   No results found.  Anti-infectives (From admission, onward)   Start     Dose/Rate Route Frequency Ordered Stop   10/08/19 1100  Ampicillin-Sulbactam (UNASYN) 3 g in sodium chloride 0.9 % 100 mL IVPB     3 g 200 mL/hr over 30 Minutes Intravenous Every 6 hours 10/08/19 1006 10/13/19 1159   10/02/19 0926  ceFAZolin (ANCEF) 2-4 GM/100ML-% IVPB  Status:  Discontinued    Note to Pharmacy: Novella Rob   : cabinet override      10/02/19 0926 10/02/19 0946      Assessment/Plan Hx HTN HLD Cocaine Use HTN Emergency PNA CVA  Dysphagia - Discussed PEG with patients daughter including indications, procedure, and risks. She wishes for Mr. [place]ree to proceed with the procedure. Will check with Endo and plan for Friday if scheduling allows.   FEN - NPO, TF's. Will update recs after scheduling  VTE - SCDs, Lovenox, ASA ID - Unasyn  Jillyn Ledger, [facility] Surgery 10/12/2019, 4:09 PM Please see Amion for pager number during day hours 7:00am-4:30pm

## 2019-10-12 NOTE — Progress Notes (Signed)
STROKE TEAM PROGRESS NOTE   INTERVAL HISTORY Patient neurological condition remains unchanged.  Is aphasic and not following any commands.  He is easily arousable.  He continues to have dense right hemiplegia.  Continues to have mild respiratory distress with upper airway secretions but has been able to maintain his oxygen saturation so far.'s temperature remains febrile but is coming down.  Serum sodium is also coming down to 145.  Trauma team has been consulted for PEG tube placement which hopefully will happen in the next few days.  No family at the bedside.  Discussed with Dr. Lynetta Mare critical care medicine  Vitals:   10/12/19 0400 10/12/19 0500 10/12/19 0600 10/12/19 0800  BP: 124/74 140/72 139/82 140/81  Pulse: (!) 59 (!) 56 60 61  Resp: (!) 25 (!) 23 (!) 27 17  Temp: 99.4 F (37.4 C)   (!) 100.8 F (38.2 C)  TempSrc: Oral   Axillary  SpO2: 95% 95% 98% 97%  Weight:  78.2 kg    Height:       CBC:  Recent Labs  Lab 10/11/19 0227 10/12/19 0611  WBC 11.3* 12.3*  HGB 13.5 13.7  HCT 41.1 42.8  MCV 94.3 94.9  PLT 337 [id number]   Basic Metabolic Panel:  Recent Labs  Lab 10/11/19 0227 10/12/19 0611  NA 150* 145  K 4.2 4.6  CL 118* 112*  CO2 22 23  GLUCOSE 151* 168*  BUN 32* 36*  CREATININE 1.31* 1.43*  CALCIUM 9.1 9.1   IMAGING past 24 hours  No results found.  PHYSICAL EXAM      General - Well nourished, well developed middle-aged African-American male, left eye has subconjunctival hemorrhage and redness Ophthalmologic - fundi not visualized due to noncooperation.  Cardiovascular - Regular rate and rhythm.  Neuro - eyes open, but still global aphasia and not following commands. Eyes in left gaze preference position, not cross midline, able to track objects and sound on the left visual field, right neglect, right hemianopia, blinks more to threat on the left than on the right.  PERRL. Right facial droop, tongue protrusion not cooperative. Spontaneous and purposeful  movement of LUE 4-/5 with finger grip 3/5 and LLE 3-/5, but no movement of RUE and RLE. Slight withdraw of RLE on pain stimulation. DTR 1+ and no babinski. Sensation, coordination not cooperative and gait not tested.   ASSESSMENT/PLAN [name] with history of HTN who fell the night prior to admission, presenting with right sided weakness and aphasia. Taken to IR for terminus Left ICA occlusion.  Stroke:   L MCA infarcts due to left M1 occlusion s/p unsuccessful L MCA thrombectomy - L MCA infarct likely secondary to L ICA large vessel disease.  Stroke:   Right MCA infarcts in setting of cocaine use and right ICA and MCA large vessel stenoses     Code Stroke CT head cytotoxic edema L insula and frontal operculum. Likely old L parietal cortical and subcortical abnormality. ASPECTS 8    CTA head & neck L ICA occlusion. Widespread atherosclerosis: B CCA to ICA bifurcation R 40%, L 50% w/ extensive soft plaques at bifurcation and bulbs, L 30-40%. Severe B ICA siphon stenoses. R MCA and B PCA atherosclerosis w/ narrowing & irregularity.  CT perfusion 105 penumbra L MCA territory.   Cerebral angio - 10/02/19 - [name], MD - diffuse and severe intracranial atherosclerosis. Proximal L M1 occlusion. Complete recanalization L M1 w/ embotrap w/ poor distal  flow d/t severe L ICA stenosis s/p angioplasty. L ICA terminus coil embolization w/ sparing L anterior choroidal.  CT head repeat 5/16 large L basal ganglia hemorrhage/contrast posterior SDH and SAH. Large cytotoxic edema L MCA w/ 81mm midline shift.   CT head 5/17 diminishing SAH contrast. L MCA infarct w/o significant mass effect  CT head 5/18 diminishing SAH. L MCA infarct increased edema, MLS 7mm. Right parietal small new infarct.   MRI evolving B cerebral infarcts including large L MCA infarct w/ edema and 5mm R midline shift, right parietal and frontal infarcts. No significant hemorrhage.  MRA No flow  L carotid systeme following thrombectomy. Advanced intracranial atherosclerosis including moderate R ICA, mild R M1, severe proximal R M2 and severe L V4 stenoses.    CT repeat 5/21 L>R infarct w/o progression. 1cm R midline shift. No hemorrhage   CT repeat 5/23 - Continued interval evolution of left greater than right ischemic infarcts with unchanged 1cm of left-to-right shift.   2D Echo EF 55-60%   TG 471 and direct LDL 95.3, goal < 70  HgbA1c 5.9   UDS + cocaine and THC  lovenox for VTE prophylaxis  No antithrombotic prior to admission, now on ASA 325mg  daily. Not on DAPT for PEG later this week.   Therapy recommendations:  SNF   Disposition:  pending   Code status - partial - DNI  Cerebral Edema  Present on admission scan  Repeat CT head 5/17 large left MCA infarct with edema but no significant midline shift  CT 5/18 repeat L MCA infarct increased edema, MLS 71mm. Right parietal small new infarct.  MRI 5/19 - large L MCA infarct with MLS 11mm  CT 5/21 - stable b/l infarct with MLS 1cm  CT 5/23 - unchanged 10 mm of left-to-right shift.   Off 3% now, S/p 23.4% saline x 1 -> 3% bolus x 1     Na normalized  Acute hypoxic respiratory failure with compromised airway Atelectasis   Intubated for IR, continued d/t compromised airway  CCM on board  On vent  On propofol -> off  CXR 5/21 - increased left lung base consolidation/atelectasis. pulm vasc congestion  Extubated Friday 5/21  Chest PT  NT suctioning  Respiratory status stabilized past few days  Fever  Pneumonia, likely aspiration  Tmax 101.2   Mild leukocytosis - 12.3  UA - 10/10/19 - 30 protein, o/w ok   CXR 10/07/19 - Increased atelectasis or consolidation at the LEFT lung base. Pulmonary vascular congestion.  CXR 10/08/19 - Poor inspiratory effort with left basilar atelectasis.  Sputum culture 5/18 normal respiratory flora  Blood culture 5/22  No growth   PICC out over weekend d/t temp.  Has PIV  Tylenol / motrin prn   Unasyn - 5/22>>    (5/26)  CCM on board  Hypertensive Emergency  SBP > 220 on arrival  Home meds:  Amlodipine 10, coreg 3.125 bid, HCTZ 25, lisinopril 40  Off cleviprex now  On amlodipine 10, clonidine 0.1 tid, hydralazine 100 q8, labetalol 300 Q8, lisinopril 40, hygroton 25  . SBP goal < 180 per Dr. Ashley Mariner . SBP 120s    . Long-term BP goal normotensive  Hyperlipidemia  Home meds:  lipitor 40, resumed in hospital  Direct LDL 95.3, goal < 70  On lipitor 40  Continue statin at discharge  Dysphagia . Secondary to stroke . NPO . Cortrak placed - 10/07/19 . On tube feedings @ 50  . Speech on board . CCM  to consult Trauma for PEG Thurs or Fri   Cocaine abuse  UDS positive for cocaine  Cessation education will be provided   Other Stroke Risk Factors  Advanced age  Former Cigarette smoker, quit 5 yrs ago  ETOH use, advised to drink no more than 2 drink(s) a day  THC abuse - UDS:  THC POSITIVE, cessation education will be provided.  Family hx stroke (mother)  Coronary artery disease, MI  Other Active Problems  CKD stage II Cre 1.43  TG elevated at 181 (10/06/19)    Left eye conjunctivitis and subconjunctival hemorrhage - Ciprofloxacin drops started 5/21  Constipation - Dulcolax sppositories  Hyperglycemia -> SSI  Hospital day # 10 Patient neurological exam continues to show significant aphasia with right hemiplegia.  He has been able to maintain his oxygen saturations so far and avoid reintubation but has persistent dysphagia and will likely need PEG tube placement which hopefully can be arranged next few days with consultation with trauma team.  Continue ongoing management of respiratory status as per critical care team.  Discussed with Dr. Kipp Brood critical care medicine.  No family available at the bedside for discussion.  This patient is critically ill and at significant risk of neurological worsening, death and  care requires constant monitoring of vital signs, hemodynamics,respiratory and cardiac monitoring, extensive review of multiple databases, frequent neurological assessment, discussion with family, other specialists and medical decision making of high complexity.I have made any additions or clarifications directly to the above note.This critical care time does not reflect procedure time, or teaching time or supervisory time of PA/NP/Med Resident etc but could involve care discussion time.  I spent30 minutes of neurocritical care time  in the care of  this patient.  Antony Contras, MD  To contact Stroke Continuity provider, please refer to [email or web address] After hours, contact General Neurology

## 2019-10-12 NOTE — Progress Notes (Signed)
  Speech Language Pathology Treatment: Dysphagia;Cognitive-Linquistic  Patient Details Name: Riley Wagner MRN: [id number] DOB: [birth year] Today's Date: 10/12/2019 Time: [id number] SLP Time Calculation (min) (ACUTE ONLY): 20 min  Assessment / Plan / Recommendation Clinical Impression  Pt is drowsy today but more accepting of brush for oral care and spoon for trials of ice chips. His vocal quality at baseline is wet and he does not cough to command despite cueing throughout session. He needed Min A for oral acceptance of ice chips to get them off the spoon, without mastication noted but he does elicit a swallow after they have melted. This is followed by immediate coughing concerning for aspiration, but he cannot expectorate any secretions orally. SLP provided Total A for completion of one-step commands throughout session without verbalizations noted. He appeared to squeeze my hand x1 to command, but this could not be replicated. His daughter was present and educated on current level of function, strategies for communication, and overall plan of care related to swallowing and language. Will continue to follow. He will need MBS at some point, but will need to demonstrate more PO acceptance (and more consistently) before he is ready.    HPI HPI: 64 yo male former smoker brought to ER on 5/16 with hypertensive crisis (BP 212/110), Rt sided weakness and aphasia.  Found to have Lt terminal ICA occlusion.  Intubated for airway protection 5/16-21.  Dx left MCA infarct due to left MI occlusion s/p unsuccessful L MCA thrombectomy; Right MCA infarcts in setting of cocaine use and right ICA and MCA large vessel stenoses.  CT 5/21 showed stable bilateral infarcts with midline shift of one cm.           SLP Plan  Continue with current plan of care       Recommendations  Diet recommendations: NPO Medication Administration: Via alternative means                Oral Care Recommendations: Oral care  QID Follow up Recommendations: Skilled Nursing facility SLP Visit Diagnosis: Aphasia (R47.01);Dysphagia, unspecified (R13.10) Plan: Continue with current plan of care       GO                 Osie Bond., M.A. [facility] Pager [phone] Office [phone]  10/12/2019, 11:09 AM

## 2019-10-13 LAB — GLUCOSE, CAPILLARY
Glucose-Capillary: 143 mg/dL — ABNORMAL HIGH (ref 70–99)
Glucose-Capillary: 151 mg/dL — ABNORMAL HIGH (ref 70–99)
Glucose-Capillary: 154 mg/dL — ABNORMAL HIGH (ref 70–99)
Glucose-Capillary: 160 mg/dL — ABNORMAL HIGH (ref 70–99)
Glucose-Capillary: 160 mg/dL — ABNORMAL HIGH (ref 70–99)
Glucose-Capillary: 160 mg/dL — ABNORMAL HIGH (ref 70–99)
Glucose-Capillary: 172 mg/dL — ABNORMAL HIGH (ref 70–99)

## 2019-10-13 LAB — CBC
HCT: 41.2 % (ref 39.0–52.0)
Hemoglobin: 13.4 g/dL (ref 13.0–17.0)
MCH: 30.5 pg (ref 26.0–34.0)
MCHC: 32.5 g/dL (ref 30.0–36.0)
MCV: 93.8 fL (ref 80.0–100.0)
Platelets: 397 10*3/uL (ref 150–400)
RBC: 4.39 MIL/uL (ref 4.22–5.81)
RDW: 15.2 % (ref 11.5–15.5)
WBC: 16 10*3/uL — ABNORMAL HIGH (ref 4.0–10.5)
nRBC: 0 % (ref 0.0–0.2)

## 2019-10-13 LAB — BASIC METABOLIC PANEL
Anion gap: 12 (ref 5–15)
BUN: 46 mg/dL — ABNORMAL HIGH (ref 8–23)
CO2: 21 mmol/L — ABNORMAL LOW (ref 22–32)
Calcium: 8.8 mg/dL — ABNORMAL LOW (ref 8.9–10.3)
Chloride: 112 mmol/L — ABNORMAL HIGH (ref 98–111)
Creatinine, Ser: 1.6 mg/dL — ABNORMAL HIGH (ref 0.61–1.24)
GFR calc Af Amer: 52 mL/min — ABNORMAL LOW (ref >60)
GFR calc non Af Amer: 45 mL/min — ABNORMAL LOW (ref >60)
Glucose, Bld: 155 mg/dL — ABNORMAL HIGH (ref 70–99)
Potassium: 4 mmol/L (ref 3.5–5.1)
Sodium: 145 mmol/L (ref 135–145)

## 2019-10-13 LAB — CULTURE, BLOOD (ROUTINE X 2)
Culture: NO GROWTH
Culture: NO GROWTH

## 2019-10-13 MED ORDER — AMANTADINE HCL 50 MG/5ML PO SYRP
100.0000 mg | ORAL_SOLUTION | Freq: Two times a day (BID) | ORAL | Status: DC
  Administered 2019-10-13 – 2019-10-26 (×26): 100 mg
  Filled 2019-10-13 (×28): qty 10

## 2019-10-13 MED ORDER — ENOXAPARIN SODIUM 40 MG/0.4ML ~~LOC~~ SOLN
40.0000 mg | SUBCUTANEOUS | Status: DC
  Administered 2019-10-15 – 2019-10-16 (×2): 40 mg via SUBCUTANEOUS
  Filled 2019-10-13 (×2): qty 0.4

## 2019-10-13 MED ORDER — AMANTADINE HCL 50 MG/5ML PO SYRP
100.0000 mg | ORAL_SOLUTION | Freq: Two times a day (BID) | ORAL | Status: DC
  Filled 2019-10-13 (×2): qty 10

## 2019-10-13 MED ORDER — AMANTADINE HCL 50 MG/5ML PO SYRP: 100.0000 mg | ORAL_SOLUTION | Freq: Two times a day (BID) | ORAL | Status: DC

## 2019-10-13 MED ORDER — SODIUM CHLORIDE 0.9 % IV SOLN
3.0000 g | Freq: Four times a day (QID) | INTRAVENOUS | Status: AC
  Administered 2019-10-13 – 2019-10-15 (×7): 3 g via INTRAVENOUS
  Filled 2019-10-13 (×7): qty 3
  Filled 2019-10-13: qty 8

## 2019-10-13 NOTE — Progress Notes (Signed)
Nutrition Follow-up  DOCUMENTATION CODES:   Not applicable  INTERVENTION:   Tube feeding via Cortrak tube: Osmolite 1.5 at 50 ml/h (1200 ml per day) Pro-stat 30 ml BID MVI daily  Provides 2000 kcal, 105 gm protein, 912 ml free water daily   NUTRITION DIAGNOSIS:   Inadequate oral intake related to inability to eat as evidenced by NPO status. Ongoing.   GOAL:   Patient will meet greater than or equal to 90% of their needs Meeting with TF  MONITOR:   TF tolerance, Labs  REASON FOR ASSESSMENT:   Consult, Ventilator Enteral/tube feeding initiation and management  ASSESSMENT:   Pt with PMH of HTN, HLD, and former smoker now admitted with L MCA infarct due to hypertensive crisis. Pt s/p emergent thrombectomy of L MCA M1 occlusion, balloon angioplasty of R ICA stenosis followed by coil embolization of R ICA secondary to persistent bleeding.   Plan for PEG Friday.   5/21 extubated; cortrak tube placed   Medications reviewed and include: colace, MVI, miralax, senokot Labs reviewed     Diet Order:   Diet Order            Diet NPO time specified  Diet effective midnight        Diet NPO time specified  Diet effective now              EDUCATION NEEDS:   No education needs have been identified at this time  Skin:  Skin Assessment: Reviewed RN Assessment  Last BM:  5/27  Height:   Ht Readings from Last 1 Encounters:  10/02/19 5\' 7"  (1.702 m)    Weight:   Wt Readings from Last 1 Encounters:  10/13/19 78 kg    Ideal Body Weight:  67.2 kg  BMI:  Body mass index is 26.93 kg/m.  Estimated Nutritional Needs:   Kcal:  1900-2100  Protein:  100-120 grams  Fluid:  > 1.9 L/day  Lockie Pares., RD, LDN, CNSC See AMiON for contact information

## 2019-10-13 NOTE — Progress Notes (Signed)
h  NAME:  Riley Wagner, MRN:  [id number], DOB:  08-24-1955, LOS: 11 ADMISSION DATE:  10/02/2019, CONSULTATION DATE:  10/02/2019 REFERRING MD:  Dr. Debbrah Alar, neuro IR, CHIEF COMPLAINT:  Altered mental status   Brief History   64 yo male former smoker brought to ER with hypertensive crisis (BP 212/110), Rt sided weakness and aphasia.  Found to have Lt terminal ICA occlusion.  Intubated for airway protection.  Noted to have intraparenchymal bleed along posterior limb of IC as well as Rt sided bleed.  Had embolization of Lt ICA terminus.  Past Medical History  HTN, [facility]   5/16 Admit. To IR for embolization of Left ICA. Back to ICU intubated. BP goal 120-140 5/17: still hypertensive in spite of maximum dose Cleviprex.  Continues to exhibit right-sided hemiparesis 5/18: CT brain showing worsening midline shift and cerebral edema.  Still having difficulty with blood pressure control, requiring titration up of oral regimen.  Of note was made DO NOT RESUSCITATE by family on 5/17 5/20 bp under control. Moving all ext except RUE. Spoke w/ family; leaning towards attempt at extubation and trach if fails.  Consults:  Neuro IR  Procedures:  ETT 5/16 >> Radial aline 5/16 >>  Significant Diagnostic Tests:   CT head 5/16 >> acute cytotoxic edema in Lt insula and frontal operculum, extensive chronic small vessel ischemic changes  Echo 5/17 >> Left Ventricle: Left ventricular ejection fraction, by estimation, is 55 to 60%. The left ventricle has normal function. The left ventricle has no regional wall motion abnormalities. The left ventricular internal cavity size was normal in size. There is mild concentric left ventricular hypertrophy. Left ventricular diastolic parameters are consistent with Grade II diastolic dysfunction (pseudonormalization). Elevated left ventricular end-diastolic pressure.  CT brain 5/18: small acute infarct in the right parietal cortex, no new hemorrhage,  left MCA infarct with progressive cytotoxic edema/swelling in 6 mm midline shift CT brain 5/21: 1. Left more than right cerebral infarcts without progression from brain MRI 2 days ago. Cytotoxic edema causes 1 cm of rightward shift.2. No interval hemorrhage. Micro Data:  SARS CoV2 PCR 5/16 >> negative Respiratory culture 5/18: nml flora  Antimicrobials:  Unasyn  Interim history/subjective:  Extubated on 5/21 .  Maintaining saturation.  Occasional upper airway secretions which clear with coughing.  Able to initiate swallowing.  Still aphasic.  Condition generally unchanged.  For PEG tube on Friday.  Objective   Blood pressure 132/68, pulse 73, temperature (!) 101.3 F (38.5 C), temperature source Axillary, resp. rate (!) 26, height 5\' 7"  (1.702 m), weight 78 kg, SpO2 97 %.        Intake/Output Summary (Last 24 hours) at 10/13/2019 [id number] Last data filed at 10/13/2019 0601 Gross per 24 hour  Intake 1355.28 ml  Output 1600 ml  Net -244.72 ml   Filed Weights   10/10/19 0500 10/12/19 0500 10/13/19 0500  Weight: 78 kg 78.2 kg 78 kg    Examination: General this is a 64 year old black male, Now on nasal cannula. Awake, not able to follow commands but is purposeful on left side. HENT right sided droop.  Reasonable control of oral secretions  Pulm scattered rhonchi, decreased Right base.  Card RRR w/ systolic murmur abd not tender Ext trace LE edema brisk CR pulses are palp Neuro eyes open spont right UE paresis. RLE 1/5/ left upper and lower w/ 5/5 strength. Not following commands but attempted to phonate today.    Resolved Hospital Problem list  non-anion anion gap metabolic acidosis  Assessment & Plan:   Acute hypoxic respiratory insufficiency with compromised airway. Atelectasis Appears to be able to manage secretions reasonably well. -Continue pulmonary toilet -Chest physiotherapy as needed.  -For PEG tube this week.   Hypertensive emergency.  Requiring titration of  clevidipine to maintain systolic blood pressure less than 160 Hx of HLD. -Oral medications optimized by neurology today.  Follow response.  Lt ICA occlusion with intraparenchymal bleed along posterior limb of IC as well as Rt sided bleed. MRI brain showed evolving bilateral cerebral infarcts, large left MCA infarct, unchanged cytotoxic edema with 8 mm of rightward midline shift.  There is negative flow in the intracranial left ICA left MCA or left A1 segment -Prognosis for meaningful functional recovery poor -Continue mild hypernatremia for cerebral edema.  CKD2. Serum creatinine stable plan Trend chemistry   Therapeutic hypernatremia, mild hyperchloremic  Plan Holding free water given HT protocol Am chemistry   Hyperglycemia Plan Cont ssi   Best practice:  Diet: NPO-->start tubefeeds /17 DVT prophylaxis: SCDs & LMWH GI prophylaxis: protonix Mobility: bed rest Code Status: full code Disposition: Ready for stepdown soon.  Kipp Brood, MD [facility] ICU Physician [place]  Pager: [phone] Mobile: [phone] After hours: [phone].  10/13/2019, 9:37 AM

## 2019-10-13 NOTE — TOC Initial Note (Addendum)
Transition of Care [facility]) - Initial/Assessment Note    Patient Details  Name: Riley Wagner MRN: [id number] Date of Birth: [birth year]  Transition of Care [facility]) CM/SW Contact:    Bethann Berkshire, [place] Phone Number: 10/13/2019, 3:11 PM  Clinical Narrative:                  CSW responded to [facility] SNF and substance use consult. Unable to address substance use consult due to pt being unconscious. Pts daughter Judson Roch was in room. CSW explained recommendation for SNF. Daughter is agreeable though also seems to be seeking long-term care. Daughter explains that family has a facility in [facility] that they would like pt to transition to. CSW explained how transportation cost could be barrier. Daughter gave contact info for pts sister [name] [phone]) who works at facility in [place]. [name] is able to clarify that pt would likely be seeking long-term care. CSW informs [name] that pt would likely need to apply for medicaid.   CSW speaks to Jasper from admissions at [facility] who confirms that they provide SNF and long-term care. Lilia Pro gives fax number to send clinicals to [phone]) CSW faxed clinicals.   [name] called CSW back and explained that facility is aware that pt would need long-term care and that once medicare stopped paying that pt would be under "medicaid pending" status. [name] explained that medicaid application was begun on 10/13/19. CSW also notified [name] that [employer] to [place] facility would cost [phone]. [name] planned to seek other transport options in her area.   Expected Discharge Plan: Skilled Nursing Facility Barriers to Discharge: Continued Medical Work up, SNF Pending bed offer   Patient Goals and CMS Choice        Expected Discharge Plan and Services Expected Discharge Plan: [place]                                              Prior Living Arrangements/Services     Patient language and need for  interpreter reviewed:: Yes        Need for Family Participation in Patient Care: Yes (Comment) Care giver support system in place?: Yes (comment)   Criminal Activity/Legal Involvement Pertinent to Current Situation/Hospitalization: No - Comment as needed  Activities of Daily Living      Permission Sought/Granted                  Emotional Assessment              Admission diagnosis:  Stroke (cerebrum) ([place]) Q000111Q Acute embolic stroke ([place]) Q000111Q Status post stroke [Z86.73] Patient Active Problem List   Diagnosis Date Noted  . Hypertensive crisis   . Acute respiratory failure ([place])   . Status post stroke 10/02/2019  . Acute embolic stroke ([place]) 123456  . Essential hypertension 12/27/2013  . Other and unspecified hyperlipidemia 12/27/2013  . History of back surgery 12/27/2013  . Smoking 12/27/2013  . Chronic kidney disease, stage III (moderate) 06/14/2012  . Chronic combined systolic and diastolic heart failure ([place]) 04/11/2012   PCP:  Antony Blackbird, MD Pharmacy:   [id number], [place] [address] Phone: [phone] Fax: [place], [place] [address] [place] [address]  Phone: [phone] Fax: [phone]     Social Determinants of Health (SDOH) Interventions    Readmission Risk Interventions No flowsheet data found.

## 2019-10-13 NOTE — Progress Notes (Signed)
Pt blood sugar 172, order to call provider if >160.   Stopped tube feeding early for prep of peg tube placement, which should alleviate hyperglycemia. [place], [place] [employer] RN notified and agreed to plan.   Leticia Clas, RN BSN, [place]

## 2019-10-13 NOTE — Progress Notes (Signed)
STROKE TEAM PROGRESS NOTE   INTERVAL HISTORY Patient is lethargic and can be barely aroused with tactile stimuli.  He remains aphasic with left gaze preference.  Left eye remains congested and red.  Dense right hemiplegia.  Spontaneous left-sided movements.  He is maintaining his oxygen saturations but remains febrile despite being on antibiotics.  PEG tube is planned for tomorrow by trauma team.  No family available at the bedside.  Discussed with Dr. Lynetta Mare critical care medicine and Dr. Bobbye Morton from trauma team  Vitals:   10/13/19 0401 10/13/19 0500 10/13/19 0600 10/13/19 0800  BP:  125/72 (!) 144/74 (!) 103/59  Pulse:  77 79   Resp:  (!) 26 (!) 30   Temp: 100.1 F (37.8 C)   (!) 101.3 F (38.5 C)  TempSrc: Axillary   Axillary  SpO2:  96% 97%   Weight:  78 kg    Height:       CBC:  Recent Labs  Lab 10/12/19 0611 10/13/19 0704  WBC 12.3* 16.0*  HGB 13.7 13.4  HCT 42.8 41.2  MCV 94.9 93.8  PLT 375 [id number]   Basic Metabolic Panel:  Recent Labs  Lab 10/12/19 0611 10/13/19 0704  NA 145 145  K 4.6 4.0  CL 112* 112*  CO2 23 21*  GLUCOSE 168* 155*  BUN 36* 46*  CREATININE 1.43* 1.60*  CALCIUM 9.1 8.8*   IMAGING past 24 hours  No results found.  PHYSICAL EXAM       General - Well nourished, well developed middle-aged African-American male, left eye has subconjunctival hemorrhage and redness Ophthalmologic - fundi not visualized due to noncooperation.  Cardiovascular - Regular rate and rhythm.  Neuro -lethargic but with stimulation does eyes open, but still global aphasia and not following commands. Eyes in left gaze preference position, not cross midline, able to track objects and sound on the left visual field, right neglect, right hemianopia, blinks more to threat on the left than on the right.  PERRL. Right facial droop, tongue protrusion not cooperative. Spontaneous and purposeful movement of LUE 4-/5 with finger grip 3/5 and LLE 3-/5, but no movement of RUE and RLE.  Slight withdraw of RLE on pain stimulation. DTR 1+ and no babinski. Sensation, coordination not cooperative and gait not tested.   ASSESSMENT/PLAN Riley Wagner is a 64 y.o. male with history of HTN who fell the night prior to admission, presenting with right sided weakness and aphasia. Taken to IR for terminus Left ICA occlusion.  Stroke:   L MCA infarcts due to left M1 occlusion s/p unsuccessful L MCA thrombectomy - L MCA infarct likely secondary to L ICA large vessel disease.  Stroke:   Right MCA infarcts in setting of cocaine use and right ICA and MCA large vessel stenoses     Code Stroke CT head cytotoxic edema L insula and frontal operculum. Likely old L parietal cortical and subcortical abnormality. ASPECTS 8    CTA head & neck L ICA occlusion. Widespread atherosclerosis: B CCA to ICA bifurcation R 40%, L 50% w/ extensive soft plaques at bifurcation and bulbs, L 30-40%. Severe B ICA siphon stenoses. R MCA and B PCA atherosclerosis w/ narrowing & irregularity.  CT perfusion 105 penumbra L MCA territory.   Cerebral angio - 10/02/19 - [name], MD - diffuse and severe intracranial atherosclerosis. Proximal L M1 occlusion. Complete recanalization L M1 w/ embotrap w/ poor distal flow d/t severe L ICA stenosis s/p angioplasty. L ICA terminus coil embolization w/  sparing L anterior choroidal.  CT head repeat 5/16 large L basal ganglia hemorrhage/contrast posterior SDH and SAH. Large cytotoxic edema L MCA w/ 100mm midline shift.   CT head 5/17 diminishing SAH contrast. L MCA infarct w/o significant mass effect  CT head 5/18 diminishing SAH. L MCA infarct increased edema, MLS 38mm. Right parietal small new infarct.   MRI evolving B cerebral infarcts including large L MCA infarct w/ edema and 26mm R midline shift, right parietal and frontal infarcts. No significant hemorrhage.  MRA No flow L carotid systeme following thrombectomy. Advanced intracranial atherosclerosis  including moderate R ICA, mild R M1, severe proximal R M2 and severe L V4 stenoses.    CT repeat 5/21 L>R infarct w/o progression. 1cm R midline shift. No hemorrhage   CT repeat 5/23 - Continued interval evolution of left greater than right ischemic infarcts with unchanged 1cm of left-to-right shift.   2D Echo EF 55-60%   TG 471 and direct LDL 95.3, goal < 70  HgbA1c 5.9   UDS + cocaine and THC  lovenox for VTE prophylaxis  No antithrombotic prior to admission, now on ASA 325mg  daily. Not on DAPT for PEG later this week.   Therapy recommendations:  SNF   Disposition:  pending   Code status - partial - DNI  Cerebral Edema  Present on admission scan  Repeat CT head 5/17 large left MCA infarct with edema but no significant midline shift  CT 5/18 repeat L MCA infarct increased edema, MLS 44mm. Right parietal small new infarct.  MRI 5/19 - large L MCA infarct with MLS 51mm  CT 5/21 - stable b/l infarct with MLS 1cm  CT 5/23 - unchanged 10 mm of left-to-right shift.   Off 3% now, S/p 23.4% saline x 1 -> 3% bolus x 1     Na normalized  Acute hypoxic respiratory failure with compromised airway Atelectasis   Intubated for IR, continued d/t compromised airway  CCM on board  On vent  On propofol -> off  CXR 5/21 - increased left lung base consolidation/atelectasis. pulm vasc congestion  Extubated Friday 5/21  Chest PT  NT suctioning  Respiratory status stabilized past few days  Fever  Pneumonia, likely aspiration  Tmax 101.2   Mild leukocytosis - 12.3  UA - 10/10/19 - 30 protein, o/w ok   CXR 10/07/19 - Increased atelectasis or consolidation at the LEFT lung base. Pulmonary vascular congestion.  CXR 10/08/19 - Poor inspiratory effort with left basilar atelectasis.  Sputum culture 5/18 normal respiratory flora  Blood culture 5/22  No growth   PICC out over weekend d/t temp. Has PIV  Tylenol / motrin prn   Unasyn - 5/22>>    (5/26)  CCM on  board  Hypertensive Emergency  SBP > 220 on arrival  Home meds:  Amlodipine 10, coreg 3.125 bid, HCTZ 25, lisinopril 40  Off cleviprex now  On amlodipine 10, clonidine 0.1 tid, hydralazine 100 q8, labetalol 300 Q8, lisinopril 40, hygroton 25  . SBP goal < 180 per Dr. Ashley Mariner . SBP 120s    . Long-term BP goal normotensive  Hyperlipidemia  Home meds:  lipitor 40, resumed in hospital  Direct LDL 95.3, goal < 70  On lipitor 40  Continue statin at discharge  Dysphagia . Secondary to stroke . NPO . Cortrak placed - 10/07/19 . On tube feedings @ 50  . Speech on board . CCM to consult Trauma for PEG Thurs or Fri   Cocaine abuse  UDS  positive for cocaine  Cessation education will be provided   Other Stroke Risk Factors  Advanced age  Former Cigarette smoker, quit 5 yrs ago  ETOH use, advised to drink no more than 2 drink(s) a day  THC abuse - UDS:  THC POSITIVE, cessation education will be provided.  Family hx stroke (mother)  Coronary artery disease, MI  Other Active Problems  CKD stage II Cre 1.43->1.6    TG elevated at 181 (10/06/19)    Left eye conjunctivitis and subconjunctival hemorrhage - Ciprofloxacin drops started 5/21  Constipation - Dulcolax sppositories  Hyperglycemia -> SSI  Hospital day # 11 Patient remains critically ill with persistent fever despite antibiotics and depressed mental status and dysphagia.  He will unable to be swallow safely soon hence will need a PEG tube tomorrow.  Continue antibiotics and ongoing respiratory care as per critical care team.  Discussed with Dr. Lynetta Mare and Dr. Bobbye Morton  This patient is critically ill and at significant risk of neurological worsening, death and care requires constant monitoring of vital signs, hemodynamics,respiratory and cardiac monitoring, extensive review of multiple databases, frequent neurological assessment, discussion with family, other specialists and medical decision making of high  complexity.I have made any additions or clarifications directly to the above note.This critical care time does not reflect procedure time, or teaching time or supervisory time of PA/NP/Med Resident etc but could involve care discussion time.  I spent30 minutes of neurocritical care time  in the care of  this patient.  Antony Contras, MD  To contact Stroke Continuity provider, please refer to [email or web address] After hours, contact General Neurology

## 2019-10-13 NOTE — Progress Notes (Signed)
Physical Therapy Treatment Patient Details Name: Riley Wagner MRN: [id number] DOB: 1955-10-19 Today's Date: 10/13/2019    History of Present Illness 64 y.o. male with a history of hypertension who was last known well prior to bed 5/15. He lost conciousness last night and fell, but refused transport with BP of [id number] systolic and negative stroke screen by EMS. On 5/16 pt with R weakness and apahasia on awakening. Pt found to have large penumbra on CT and taken for emergent IR thrombectomy of L MCA followed by L ICA coil embolization 2/2 bleeding. Pt extubated on 5/21.    PT Comments    Patient progressing slowly.  Able to keep both eyes open a few minutes while at EOB.  Brought flashlight to midline with A and manipulated with L hand with R hand close by.  Still not tracking past midline, but able to focus briefly at midline today.  Sitting balance at times with mod A, but leaning R and posterior at times.  Still nonverbal and with difficulty managing secretions.  Remains appropriate for SNF level rehab at d/c.    Follow Up Recommendations  SNF;Supervision/Assistance - 24 hour     Equipment Recommendations  Wheelchair (measurements PT);Wheelchair cushion (measurements PT);Hospital bed    Recommendations for Other Services       Precautions / Restrictions Precautions Precautions: Fall Precaution Comments: 123456 systolically; R HP    Mobility  Bed Mobility Overal bed mobility: Needs Assistance Bed Mobility: Supine to Sit;Rolling Rolling: Max assist;+2 for physical assistance   Supine to sit: Max assist;HOB elevated;+2 for physical assistance Sit to supine: Total assist;+2 for physical assistance   General bed mobility comments: up to EOB with A for R LE and trunk, to supine with A for trunk and legs; rolling for hygiene once supine, then A to scoot to EOB and bed lifted to chair semi reclined position  Transfers                    Ambulation/Gait                  Stairs             Wheelchair Mobility    Modified Rankin (Stroke Patients Only) Modified Rankin (Stroke Patients Only) Pre-Morbid Rankin Score: No symptoms Modified Rankin: Severe disability     Balance Overall balance assessment: Needs assistance Sitting-balance support: Feet supported Sitting balance-Leahy Scale: Poor Sitting balance - Comments: sitting EOB with mod to max A for balance pt leaning back at times and to R; work to encourage increased level of arousal, attention to item brought to midline and A for brining both hands to midline touching R with L UE                                    Cognition Arousal/Alertness: Lethargic Behavior During Therapy: Flat affect   Area of Impairment: Attention;Safety/judgement;Following commands;Awareness;Problem solving                   Current Attention Level: Focused   Following Commands: Follows one step commands inconsistently Safety/Judgement: Decreased awareness of safety;Decreased awareness of deficits   Problem Solving: Slow processing;Requires verbal cues;Requires tactile cues;Decreased initiation        Exercises Other Exercises Other Exercises: seated trunk rotation and head/neck rotation PROM/AAROM    General Comments General comments (skin integrity, edema, etc.): Patient with both eyes open for few  minutes while up sitting EOB and demonstrated ability to grasp but not release flashlight in L hand, he did grasp R hand with L for brief moment as well.  Still not traching past midline with eyes to R.  Incontinent of stool in supine.  Inability to swallow secretions so assisted with wash cloth versus suctioning;  VSS in sitting and in bed up in chair position      Pertinent Vitals/Pain Faces Pain Scale: No hurt    Home Living                      Prior Function            PT Goals (current goals can now be found in the care plan section) Progress towards PT goals:  Progressing toward goals(slowly)    Frequency    Min 3X/week      PT Plan Current plan remains appropriate    Co-evaluation              AM-PAC PT "6 Clicks" Mobility   Outcome Measure  Help needed turning from your back to your side while in a flat bed without using bedrails?: Total Help needed moving from lying on your back to sitting on the side of a flat bed without using bedrails?: Total Help needed moving to and from a bed to a chair (including a wheelchair)?: Total Help needed standing up from a chair using your arms (e.g., wheelchair or bedside chair)?: Total Help needed to walk in hospital room?: Total Help needed climbing 3-5 steps with a railing? : Total 6 Click Score: 6    End of Session Equipment Utilized During Treatment: Oxygen Activity Tolerance: Patient tolerated treatment well Patient left: in chair;with call bell/phone within reach;with bed alarm set   PT Visit Diagnosis: Other abnormalities of gait and mobility (R26.89);Muscle weakness (generalized) (M62.81);Hemiplegia and hemiparesis;Other symptoms and signs involving the nervous system (R29.898) Hemiplegia - Right/Left: Right Hemiplegia - dominant/non-dominant: (unsure) Hemiplegia - caused by: Cerebral infarction     Time: 0940-1009 PT Time Calculation (min) (ACUTE ONLY): 29 min  Charges:  $Therapeutic Activity: 23-37 mins                     Magda Kiel, PT [place] [phone] 10/13/2019    Riley Wagner 10/13/2019, 4:02 PM

## 2019-10-13 NOTE — Progress Notes (Signed)
Patient scheduled for bedside PEG at 1030am on Friday, 10/14/2019. Please hold tube feeds after midnight.

## 2019-10-14 ENCOUNTER — Encounter (HOSPITAL_COMMUNITY)
Admission: EM | Disposition: A | Discharge status: Nursing Home (Includes ICF) | Payer: Self-pay | Source: Home / Self Care | Attending: Neurology

## 2019-10-14 HISTORY — PX: PEG PLACEMENT: SHX5437

## 2019-10-14 HISTORY — PX: ESOPHAGOGASTRODUODENOSCOPY: SHX5428

## 2019-10-14 LAB — GLUCOSE, CAPILLARY
Glucose-Capillary: 106 mg/dL — ABNORMAL HIGH (ref 70–99)
Glucose-Capillary: 116 mg/dL — ABNORMAL HIGH (ref 70–99)
Glucose-Capillary: 132 mg/dL — ABNORMAL HIGH (ref 70–99)
Glucose-Capillary: 131 mg/dL — ABNORMAL HIGH (ref 70–99)

## 2019-10-14 SURGERY — EGD (ESOPHAGOGASTRODUODENOSCOPY)

## 2019-10-14 MED ORDER — ORAL CARE MOUTH RINSE
15.0000 mL | Freq: Two times a day (BID) | OROMUCOSAL | Status: DC
  Administered 2019-10-14 – 2019-10-19 (×12): 15 mL via OROMUCOSAL

## 2019-10-14 MED ORDER — MIDAZOLAM HCL 2 MG/2ML IJ SOLN
INTRAMUSCULAR | Status: AC
  Administered 2019-10-14: 8 mg
  Filled 2019-10-14: qty 2

## 2019-10-14 MED ORDER — MIDAZOLAM HCL 2 MG/2ML IJ SOLN
INTRAMUSCULAR | Status: AC
  Filled 2019-10-14: qty 6

## 2019-10-14 MED ORDER — CHLORHEXIDINE GLUCONATE 0.12 % MT SOLN
15.0000 mL | Freq: Two times a day (BID) | OROMUCOSAL | Status: DC
  Administered 2019-10-14 – 2019-10-19 (×11): 15 mL via OROMUCOSAL
  Filled 2019-10-14 (×7): qty 15

## 2019-10-14 MED ORDER — FENTANYL CITRATE (PF) 100 MCG/2ML IJ SOLN
INTRAMUSCULAR | Status: AC
  Administered 2019-10-14: 100 ug
  Filled 2019-10-14: qty 2

## 2019-10-14 MED ORDER — MIDAZOLAM HCL 2 MG/2ML IJ SOLN
INTRAMUSCULAR | Status: AC
  Filled 2019-10-14: qty 2

## 2019-10-14 MED ORDER — SODIUM CHLORIDE 0.9 % IV BOLUS
500.0000 mL | Freq: Once | INTRAVENOUS | Status: AC
  Administered 2019-10-14: 500 mL via INTRAVENOUS

## 2019-10-14 NOTE — Progress Notes (Signed)
STROKE TEAM PROGRESS NOTE   INTERVAL HISTORY Patient is sitting up in bed.  He remains globally aphasic with dense right hemiplegia.  Is afebrile and vital signs seem stable today.  He is scheduled for PEG tube placement by trauma team later this morning.  Discussed with son at the bedside and answered questions.  Vitals:   10/14/19 0600 10/14/19 0615 10/14/19 0700 10/14/19 0800  BP: 106/71 106/71 113/72 134/70  Pulse: 72  77 77  Resp: (!) 21  (!) 22 (!) 21  Temp:    99 F (37.2 C)  TempSrc:    Oral  SpO2: 100%  100% 100%  Weight:      Height:       CBC:  Recent Labs  Lab 10/12/19 0611 10/13/19 0704  WBC 12.3* 16.0*  HGB 13.7 13.4  HCT 42.8 41.2  MCV 94.9 93.8  PLT 375 [id number]   Basic Metabolic Panel:  Recent Labs  Lab 10/12/19 0611 10/13/19 0704  NA 145 145  K 4.6 4.0  CL 112* 112*  CO2 23 21*  GLUCOSE 168* 155*  BUN 36* 46*  CREATININE 1.43* 1.60*  CALCIUM 9.1 8.8*   IMAGING past 24 hours  No results found.  PHYSICAL EXAM       General - Well nourished, well developed middle-aged African-American male, left eye has subconjunctival hemorrhage and redness Ophthalmologic - fundi not visualized due to noncooperation.  Cardiovascular - Regular rate and rhythm.  Neuro -lethargic but with stimulation does eyes open, but still global aphasia and not following commands. Eyes in left gaze preference position, not cross midline, able to track objects and sound on the left visual field, right neglect, right hemianopia, blinks more to threat on the left than on the right.  PERRL. Right facial droop, tongue protrusion not cooperative. Spontaneous and purposeful movement of LUE 4-/5 with finger grip 3/5 and LLE 3-/5, but no movement of RUE and RLE. Slight withdraw of RLE on pain stimulation. DTR 1+ and no babinski. Sensation, coordination not cooperative and gait not tested.   ASSESSMENT/PLAN [name] with history of HTN who fell the night prior to  admission, presenting with right sided weakness and aphasia. Taken to IR for terminus Left ICA occlusion.  Stroke:   L MCA infarcts due to left M1 occlusion s/p unsuccessful L MCA thrombectomy - L MCA infarct likely secondary to L ICA large vessel disease.  Stroke:   Right MCA infarcts in setting of cocaine use and right ICA and MCA large vessel stenoses     Code Stroke CT head cytotoxic edema L insula and frontal operculum. Likely old L parietal cortical and subcortical abnormality. ASPECTS 8    CTA head & neck L ICA occlusion. Widespread atherosclerosis: B CCA to ICA bifurcation R 40%, L 50% w/ extensive soft plaques at bifurcation and bulbs, L 30-40%. Severe B ICA siphon stenoses. R MCA and B PCA atherosclerosis w/ narrowing & irregularity.  CT perfusion 105 penumbra L MCA territory.   Cerebral angio - 10/02/19 - [name], MD - diffuse and severe intracranial atherosclerosis. Proximal L M1 occlusion. Complete recanalization L M1 w/ embotrap w/ poor distal flow d/t severe L ICA stenosis s/p angioplasty. L ICA terminus coil embolization w/ sparing L anterior choroidal.  CT head repeat 5/16 large L basal ganglia hemorrhage/contrast posterior SDH and SAH. Large cytotoxic edema L MCA w/ 55mm midline shift.   CT head 5/17 diminishing SAH contrast. L MCA  infarct w/o significant mass effect  CT head 5/18 diminishing SAH. L MCA infarct increased edema, MLS 9mm. Right parietal small new infarct.   MRI evolving B cerebral infarcts including large L MCA infarct w/ edema and 76mm R midline shift, right parietal and frontal infarcts. No significant hemorrhage.  MRA No flow L carotid systeme following thrombectomy. Advanced intracranial atherosclerosis including moderate R ICA, mild R M1, severe proximal R M2 and severe L V4 stenoses.    CT repeat 5/21 L>R infarct w/o progression. 1cm R midline shift. No hemorrhage   CT repeat 5/23 - Continued interval evolution of left greater than  right ischemic infarcts with unchanged 1cm of left-to-right shift.   2D Echo EF 55-60%   TG 471 and direct LDL 95.3, goal < 70  HgbA1c 5.9   UDS + cocaine and THC  lovenox 40 for VTE prophylaxis  No antithrombotic prior to admission, now on ASA 325mg  daily. Not on DAPT for PEG 5/28.  Therapy recommendations:  SNF   Disposition:  pending   Code status - partial - DNI  Cerebral Edema  Present on admission scan  Repeat CT head 5/17 large left MCA infarct with edema but no significant midline shift  CT 5/18 repeat L MCA infarct increased edema, MLS 37mm. Right parietal small new infarct.  MRI 5/19 - large L MCA infarct with MLS 78mm  CT 5/21 - stable b/l infarct with MLS 1cm  CT 5/23 - unchanged 10 mm of left-to-right shift.   Off 3% now, S/p 23.4% saline x 1 -> 3% bolus x 1     Na normalized  Acute hypoxic respiratory failure with compromised airway Atelectasis   Intubated for IR, continued d/t compromised airway  CCM on board  On vent  On propofol -> off  CXR 5/21 - increased left lung base consolidation/atelectasis. pulm vasc congestion  Extubated Friday 5/21  Chest PT  NT suctioning  Respiratory status stabilized past few days  Fever  Pneumonia, likely aspiration  Tmax 101.3   Mild leukocytosis - 16.0   UA - 10/10/19 - 30 protein, o/w ok   CXR 10/07/19 - Increased atelectasis or consolidation at the LEFT lung base. Pulmonary vascular congestion.  CXR 10/08/19 - Poor inspiratory effort with left basilar atelectasis.  Sputum culture 5/18 normal respiratory flora  Blood culture 5/22  No growth   PICC out over weekend d/t temp. Has PIV  Tylenol / motrin prn   Unasyn - 5/22>>    (5/27)  CCM on board  Hypertensive Emergency  SBP > 220 on arrival  Home meds:  Amlodipine 10, coreg 3.125 bid, HCTZ 25, lisinopril 40  Off cleviprex now  On amlodipine 10, clonidine 0.1 tid, hydralazine 100 q8, labetalol 300 Q8, lisinopril 40, hygroton 25   . SBP goal < 180 per Dr. Ashley Mariner . BP 110-130s . Long-term BP goal normotensive  Hyperlipidemia  Home meds:  lipitor 40, resumed in hospital  Direct LDL 95.3, goal < 70  On lipitor 40  Continue statin at discharge  Dysphagia . Secondary to stroke . NPO . Cortrak placed - 10/07/19 . TF off for PEG placement 5/28 . Speech on board  Cocaine abuse  UDS positive for cocaine  Cessation education will be provided   Other Stroke Risk Factors  Advanced age  Former Cigarette smoker, quit 5 yrs ago  ETOH use, advised to drink no more than 2 drink(s) a day  THC abuse - UDS:  THC POSITIVE, cessation education will be provided.  Family hx stroke (mother)  Coronary artery disease, MI  Other Active Problems  CKD stage II Cre 1.43->1.6    TG elevated at 181 (10/06/19)    Left eye conjunctivitis and subconjunctival hemorrhage - Ciprofloxacin drops started 5/21  Constipation - Dulcolax sppositories  Hyperglycemia -> SSI  Lethargy. Amantadine 100 bid added 5/27/ 21  Hospital day # 12 Patient continues to have significant global aphasia right hemiplegia and dysphagia.  Plans for elective PEG tube placement today by trauma team.  Long discussion with son at the bedside about prognosis and plan of care and answered questions.  Discussed with Dr. Lynetta Mare critical care medicine Dr. Bobbye Morton trauma team  Greater than 50% time during this 35-minute visit was spent in counseling and coordination of care and discussion with care team. Antony Contras, MD  To contact Stroke Continuity provider, please refer to [email or web address] After hours, contact General Neurology

## 2019-10-14 NOTE — Progress Notes (Signed)
h  NAME:  Riley Wagner, MRN:  [id number], DOB:  04-Feb-1956, LOS: 12 ADMISSION DATE:  10/02/2019, CONSULTATION DATE:  10/02/2019 REFERRING MD:  Dr. Debbrah Alar, neuro IR, CHIEF COMPLAINT:  Altered mental status   Brief History   64 yo male former smoker brought to ER with hypertensive crisis (BP 212/110), Rt sided weakness and aphasia.  Found to have Lt terminal ICA occlusion.  Intubated for airway protection. Noted to have intraparenchymal bleed along posterior limb of IC as well as Rt sided bleed.  Had embolization of Lt ICA terminus.  Past Medical History  HTN, [place] [facility] Events   5/16 Admit. To IR for embolization of Left ICA. Back to ICU intubated. BP goal 120-140 5/17: still hypertensive in spite of maximum dose Cleviprex.  Continues to exhibit right-sided hemiparesis 5/18: CT brain showing worsening midline shift and cerebral edema.  Still having difficulty with blood pressure control, requiring titration up of oral regimen.  Of note was made DO NOT RESUSCITATE by family on 5/17 5/20 bp under control. Moving all ext except RUE. Spoke w/ family; leaning towards attempt at extubation and trach if fails.  5/21 Extubated  5/28 Planning for placement of PEG today   Consults:  Neuro IR  Procedures:  ETT 5/16 >> 5/21 Radial aline 5/16 >>  Significant Diagnostic Tests:   CT head 5/16 >> acute cytotoxic edema in Lt insula and frontal operculum, extensive chronic small vessel ischemic changes  Echo 5/17 >> Left Ventricle: Left ventricular ejection fraction, by estimation, is 55 to 60%. The left ventricle has normal function. The left ventricle has no regional wall motion abnormalities. The left ventricular internal cavity size was normal in size. There is mild concentric left ventricular hypertrophy. Left ventricular diastolic parameters are consistent with Grade II diastolic dysfunction (pseudonormalization). Elevated left ventricular end-diastolic pressure.  CT brain 5/18:  small acute infarct in the right parietal cortex, no new hemorrhage, left MCA infarct with progressive cytotoxic edema/swelling in 6 mm midline shif  CT brain 5/21: 1. Left more than right cerebral infarcts without progression from brain MRI 2 days ago. Cytotoxic edema causes 1 cm of rightward shift.2. No interval hemorrhage.  Micro Data:  SARS CoV2 PCR 5/16 >> negative MRSA PCR 5/16 > negative Respiratory culture 5/18: nml flora  Blood culture 5/22 >   Antimicrobials:  Unasyn 5/22 >   Interim history/subjective:   Successful PEG placement today.  Incidental finding of severe esophagitis. Somewhat more alert but still no effort to speak.   Objective   Blood pressure 111/74, pulse 72, temperature 99 F (37.2 C), temperature source Oral, resp. rate 20, height 5\' 7"  (1.702 m), weight 76.4 kg, SpO2 100 %.        Intake/Output Summary (Last 24 hours) at 10/14/2019 [id number] Last data filed at 10/14/2019 0700 Gross per 24 hour  Intake 1099.29 ml  Output 1800 ml  Net -700.71 ml   Filed Weights   10/12/19 0500 10/13/19 0500 10/14/19 0500  Weight: 78.2 kg 78 kg 76.4 kg    Examination: General: Chronically ill appearing elderly male lying in bed, in NAD HEENT: [place]/AT, MM pink/moist, PERRL, sclera non-icteric  Neuro: Awake, moves left side spontaneously but not to command.  Remains hemiplegic on the right. CV: s1s2 regular rate and rhythm, no murmur, rubs, or gallops,  PULM:  No increased respiratory effort.  Mild wheezing on exam. GI: Abdominal binder in place following PEG tube insertion. Extremities: warm/dry, mild edema.  Edema  Skin: no rashes or  lesions  Resolved Hospital Problem list   non-anion anion gap metabolic acidosis Acute hypoxic respiratory insufficiency with compromised airway. -Extubated 5/21 Hypertensive emergency Therapeutic hypernatremia, mild hyperchloremic   Assessment & Plan:   L MCA infarcts due to left M1 occlusion s/p unsuccessful L MCA thrombectomy  L  MCA infarct likely secondary to L ICA large vessel disease.  Right MCA infarcts in setting of cocaine use and right ICA and MCA large vessel stenoses    Cerebral edema  -MRI brain showed evolving bilateral cerebral infarcts, large left MCA infarct, unchanged cytotoxic edema with 8 mm of rightward midline shift.  There is negative flow in the intracranial left ICA left MCA or left A1 segment -Prognosis for meaningful functional recovery poor P: Management per primary  Now off 3% saline  Neuro assessments  Continue ASA once PEG placed will nee DAPT   Aspiration pneumonia  -Seen with spike in fever 5/22 day after extubation with concern for aspiration pneumonia. IV antibiotics started P: Consider de-escilation of antibiotics  Pulmonary hygiene For PEG tube today  Wean supplement oxygen   HX of hypertension  Hx of HLD. -Oral medications optimized by neurology P:  BP remains stable  Continue current regiment  Continue statin   CKD2. Serum creatinine stable P: Follow renal function / urine output Trend Bmet Avoid nephrotoxins, ensure adequate renal perfusion   Hyperglycemia P: Continue SSI   PCCM will sign off. Thank you for the opportunity to participate in this patient's care. Please contact if we can be of further assistance.   Best practice:  Diet: NPO-->start tubefeeds /17 DVT prophylaxis: SCDs & LMWH GI prophylaxis: protonix Mobility: bed rest Code Status: full code Disposition: Ready for stepdown soon   Labs   CBC: Recent Labs  Lab 10/09/19 0529 10/10/19 0726 10/11/19 0227 10/12/19 0611 10/13/19 0704  WBC 10.4 10.6* 11.3* 12.3* 16.0*  HGB 13.0 14.2 13.5 13.7 13.4  HCT 40.8 44.0 41.1 42.8 41.2  MCV 95.8 95.0 94.3 94.9 93.8  PLT 244 280 337 375 [id number]    Basic Metabolic Panel: Recent Labs  Lab 10/09/19 0529 10/09/19 1150 10/10/19 0144 10/10/19 0726 10/11/19 0227 10/12/19 0611 10/13/19 0704  NA 154*   < > 154* 150* 150* 145 145  K 3.9  --   --   3.7 4.2 4.6 4.0  CL 121*  --   --  118* 118* 112* 112*  CO2 24  --   --  22 22 23  21*  GLUCOSE 146*  --   --  155* 151* 168* 155*  BUN 30*  --   --  29* 32* 36* 46*  CREATININE 1.23  --   --  1.34* 1.31* 1.43* 1.60*  CALCIUM 8.8*  --   --  9.1 9.1 9.1 8.8*   < > = values in this interval not displayed.   GFR: Estimated Creatinine Clearance: 43 mL/min (A) (by C-G formula based on SCr of 1.6 mg/dL (H)). Recent Labs  Lab 10/10/19 0726 10/11/19 0227 10/12/19 0611 10/13/19 0704  WBC 10.6* 11.3* 12.3* 16.0*    Liver Function Tests: No results for input(s): AST, ALT, ALKPHOS, BILITOT, PROT, ALBUMIN in the last 168 hours. No results for input(s): LIPASE, AMYLASE in the last 168 hours. No results for input(s): AMMONIA in the last 168 hours.  ABG    Component Value Date/Time   PHART 7.343 (L) 10/02/2019 1332   PCO2ART 44.4 10/02/2019 1332   PO2ART 58 (L) 10/02/2019 1332   HCO3 24.2 10/02/2019  1332   TCO2 26 10/02/2019 1332   ACIDBASEDEF 2.0 10/02/2019 1332   O2SAT 88.0 10/02/2019 1332     Coagulation Profile: No results for input(s): INR, PROTIME in the last 168 hours.  Cardiac Enzymes: No results for input(s): CKTOTAL, CKMB, CKMBINDEX, TROPONINI in the last 168 hours.  HbA1C: Hgb A1c MFr Bld  Date/Time Value Ref Range Status  10/03/2019 02:15 AM 5.9 (H) 4.8 - 5.6 % Final    Comment:    (NOTE)         Prediabetes: 5.7 - 6.4         Diabetes: >6.4         Glycemic control for adults with diabetes: <7.0     CBG: Recent Labs  Lab 10/13/19 1102 10/13/19 1548 10/13/19 1900 10/13/19 2304 10/14/19 [place], MD [facility] ICU Physician [place]  Pager: [phone] Mobile: [phone] After hours: [phone].  10/14/2019, 4:58 PM

## 2019-10-14 NOTE — TOC Progression Note (Signed)
Transition of Care [facility]) - Progression Note    Patient Details  Name: Riley Wagner MRN: [id number] Date of Birth: [birth year]  Transition of Care [facility]) CM/SW [place], [place] Phone Number: 10/14/2019, 8:55 AM  Clinical Narrative:     Multiple faxes to [facility] failed at [phone] CSW did confirm that he has correct fax number. Receptionist at [facility], called at [phone], stated that they are having trouble with their fax machine. Admissions coordinator not in until 0930. CSW will call after 0930.     Expected Discharge Plan: Skilled Nursing Facility Barriers to Discharge: Continued Medical Work up, SNF Pending bed offer  Expected Discharge Plan and Services Expected Discharge Plan: [place]                                               Social Determinants of Health (SDOH) Interventions    Readmission Risk Interventions No flowsheet data found.

## 2019-10-14 NOTE — Progress Notes (Signed)
OT Cancellation Note  Patient Details Name: Jabrian Demattia MRN: [id number] DOB: 1955/12/15   Cancelled Treatment:    Reason Eval/Treat Not Completed: Patient at procedure or test/ unavailable(peg) Pt currently sedated due to Peg placement.   Billey Chang, OTR/L  Acute Rehabilitation Services Pager: [phone] Office: [phone] .  10/14/2019, 10:58 AM

## 2019-10-14 NOTE — Progress Notes (Signed)
Patient seen and examined. Discussed with patient's daughter. Informed consent was obtained after detailed explanation of risks, including bleeding, infection, malposition, dislodgement. All questions answered to the daughter's satisfaction.  Jesusita Oka, MD General and [facility]

## 2019-10-14 NOTE — Procedures (Signed)
   Procedure Note  Date: 10/14/2019  Procedure: esophagogastroduodenoscopy (EGD) and percutaneous endoscopic gastrostomy (PEG) tube placement  Pre-op diagnosis: dysphagia, malnutrition Post-op diagnosis: same  Indication and clinical history: 70M with h/o stroke and dysphagia  Surgeon: Jesusita Oka, MD Assistant: Rodolph Bong, PA  Anesthesia: MAC  Findings: extensive esophagitis . Specimen: none . EBL: <5cc . Drains/Implants: PEG tube, 2cm at the skin   Disposition: ICU/PACU  Description of Procedure: The patient was positioned semi-recumbent. Time-out was performed verifying correct patient, procedure, signature of informed consent, and pre-operative antibiotics as indicated. MAC induction was uneventful and a bite block was placed into the oropharynx. The endoscope was inserted into the oropharynx and advanced down the esophagus into the stomach and into the duodenum. The visualized esophagus was notable for extensive esophagitis circumferentially and for nearly the entire length of the esophagus. The duodenum was unremarkable. The endoscope was retracted back into the stomach and the stomach was insufflated. The stomach was inspected and was also normal. Transillumination was performed. The light was visible on the external skin and dimpling of the stomach was noted endoscopically with manual pressure. The abdomen was prepped and draped in the usual sterile fashion. Transillumination and dimpling were repeated and local anesthetic was infiltrated to make a skin wheal at the site of transillumination. The needle was inserted perpendicularly to the skin and the tip of the needle was visualized endoscopically. As the needle was retracted, the tract was also anesthetized. A skin nick was made at the site of the wheal and an introducer needle and sheath were inserted. The needle was removed and guidewire inserted. The guidewire was grasped by an endoscopic snare and the snare, guidewire, and  endoscope retracted out of the oropharynx. The PEG tube was secured to the guidewire and retracted through the mouth and esophagus into the stomach. The PEG tube was secured with a bolster and was visualized endoscopically to spin freely circumferentially and also be without gaps between the internal bumper and the stomach wall. There was no evidence of bleeding. The PEG bolster was secured at 2cm at the skin and there were no gaps between the bolster and the abdominal wall. The stomach was desufflated endoscopically and the endoscope removed. The bite block was also removed. The patient tolerated the procedure well and there were no complications.   The patient may have water and medications administered via the PEG tube beginning immediately and tube feeds may be initiated four hours post-procedure.    Jesusita Oka, MD General and [place] [facility]

## 2019-10-14 NOTE — Progress Notes (Signed)
Physical Therapy Treatment Patient Details Name: Riley Wagner MRN: [id number] DOB: 04-28-1956 Today's Date: 10/14/2019    History of Present Illness 64 y.o. male with a history of hypertension who was last known well prior to bed 5/15. He lost conciousness last night and fell, but refused transport with BP of [id number] systolic and negative stroke screen by EMS. On 5/16 pt with R weakness and apahasia on awakening. Pt found to have large penumbra on CT and taken for emergent IR thrombectomy of L MCA followed by L ICA coil embolization 2/2 bleeding. Pt extubated on 5/21.    PT Comments    Patient progressing slowly with mobility able to attempt sit to stand today with some weight acceptance in LE's, but not able to initiate full upright standing and only tolerated briefly.  Still with limited focus and command following likely due to global aphasia.  Patient more alert in session, especially with music played today.  VSS in sitting, but throughout session having to clean him up due to leaking rectal tube.  Patient remains appropriate for SNF level rehab at d/c,    Follow Up Recommendations  SNF;Supervision/Assistance - 24 hour     Equipment Recommendations  Wheelchair (measurements PT);Wheelchair cushion (measurements PT);Hospital bed    Recommendations for Other Services       Precautions / Restrictions Precautions Precautions: Fall Precaution Comments: [id number] systolically; R HP Restrictions Weight Bearing Restrictions: No    Mobility  Bed Mobility Overal bed mobility: Needs Assistance Bed Mobility: Rolling;Sidelying to Sit;Sit to Supine Rolling: Max assist Sidelying to sit: Max assist;+2 for safety/equipment   Sit to supine: Max assist;+2 for physical assistance   General bed mobility comments: assist for legs off bed and trunk upright to sit, rolled a bit with increased time and assist for hip and shoulder after knee flexed for hygiene  Transfers Overall transfer level: Needs  assistance Equipment used: 2 person hand held assist Transfers: Sit to/from Stand Sit to Stand: Max assist;+2 physical assistance         General transfer comment: assist to block knees and lifted with pad under pt with pt having some weight acceptance L > R but only maintained few seconds  Ambulation/Gait                 Stairs             Wheelchair Mobility    Modified Rankin (Stroke Patients Only) Modified Rankin (Stroke Patients Only) Pre-Morbid Rankin Score: No symptoms Modified Rankin: Severe disability     Balance Overall balance assessment: Needs assistance Sitting-balance support: Feet supported Sitting balance-Leahy Scale: Poor Sitting balance - Comments: up at EOB mod to max A for balance initially, progressing to min to mod A with initial R lateral lean, then posterior.  Attempt to engage in using L UE on footboard for balance, but pt did not keep grip on it when placed and attending to it after much cues and assist.  Sat EOB about 10 minutes. work on trunk flexion, rotation and head/neck rotation and extension for upright posture   Standing balance support: Bilateral upper extremity supported Standing balance-Leahy Scale: Zero Standing balance comment: stood briefly with max A +2                            Cognition Arousal/Alertness: Awake/alert Behavior During Therapy: Flat affect Overall Cognitive Status: Difficult to assess Area of Impairment: Attention;Safety/judgement;Following commands;Awareness;Problem solving  Current Attention Level: Focused   Following Commands: Follows one step commands inconsistently;Follows one step commands with increased time Safety/Judgement: Decreased awareness of safety;Decreased awareness of deficits   Problem Solving: Slow processing;Requires verbal cues;Requires tactile cues;Decreased initiation General Comments: followed about 1/5 comands this session, continued  decreased R side awareness, slightly more focused when playing music during session      Exercises Other Exercises Other Exercises: PROM R UE & LE in supine with passive stretch for heel cord; AAROM for L UE/LE but no movement initiated with facilitation even on L    General Comments General comments (skin integrity, edema, etc.): Engaging briefly to look out window when cued which was more to his R and straight forward, eyes still deviated L, but with head turn noticed window with cues.  Some limited attention to flashlight in the room, but slight improvement in focus when music playing, attempted to engage in using cell phone, but limited as well. irritated with me wiping his mouth from time to time due to secretions.      Pertinent Vitals/Pain Pain Assessment: Faces Faces Pain Scale: No hurt    Home Living                      Prior Function            PT Goals (current goals can now be found in the care plan section) Progress towards PT goals: Progressing toward goals    Frequency    Min 3X/week      PT Plan Current plan remains appropriate    Co-evaluation              AM-PAC PT "6 Clicks" Mobility   Outcome Measure  Help needed turning from your back to your side while in a flat bed without using bedrails?: Total Help needed moving from lying on your back to sitting on the side of a flat bed without using bedrails?: Total Help needed moving to and from a bed to a chair (including a wheelchair)?: Total Help needed standing up from a chair using your arms (e.g., wheelchair or bedside chair)?: Total Help needed to walk in hospital room?: Total Help needed climbing 3-5 steps with a railing? : Total 6 Click Score: 6    End of Session Equipment Utilized During Treatment: Oxygen Activity Tolerance: Patient tolerated treatment well Patient left: in bed;with call bell/phone within reach;with bed alarm set   PT Visit Diagnosis: Other abnormalities of  gait and mobility (R26.89);Muscle weakness (generalized) (M62.81);Hemiplegia and hemiparesis;Other symptoms and signs involving the nervous system (R29.898) Hemiplegia - Right/Left: Right Hemiplegia - dominant/non-dominant: (unknown) Hemiplegia - caused by: Cerebral infarction     Time: [id number] PT Time Calculation (min) (ACUTE ONLY): 33 min  Charges:  $Therapeutic Activity: 8-22 mins $Neuromuscular Re-education: 8-22 mins                     Magda Kiel, [place] [facility] [phone] 10/14/2019    Reginia Naas 10/14/2019, 10:07 AM

## 2019-10-14 NOTE — TOC Progression Note (Signed)
Transition of Care [facility]) - Progression Note    Patient Details  Name: Riley Wagner MRN: [id number] Date of Birth: 1956-02-15  Transition of Care [facility]) CM/SW [place], [place] Phone Number: 10/14/2019, 3:35 PM  Clinical Narrative:     [place] transport service to inquire about cost of transport [facility]. Unable to quote with insurance due to financial department being out of office though quoted $881 out of pocket.   CSW called [place] re[facility] to follow up on referral info sent. CSW left message for return call.   CSW called sister [name] [phone]) and updated on cheaper transport option and waiting response for referral.   Expected Discharge Plan: [place] Barriers to Discharge: Continued Medical Work up, SNF Pending bed offer  Expected Discharge Plan and Services Expected Discharge Plan: [place]                                               Social Determinants of Health (SDOH) Interventions    Readmission Risk Interventions No flowsheet data found.

## 2019-10-14 NOTE — Op Note (Signed)
[facility] Patient Name: Riley Wagner Procedure Date : 10/14/2019 MRN: [id number] Attending MD: Jesusita Oka ,  Date of Birth: 1955/11/14 CSN: [id number] Age: 64 Admit Type: Inpatient Procedure:                Upper GI endoscopy Indications:              Dysphagia Providers:                Sherlene Shams, Technician Referring MD:              Medicines:                See the Anesthesia note for documentation of the                            administered medications Complications:            No immediate complications. Estimated blood loss:                            Minimal. Estimated Blood Loss:     Estimated blood loss was minimal. Procedure:                After obtaining informed consent, the endoscope was                            passed under direct vision. Throughout the                            procedure, the patient's blood pressure, pulse, and                            oxygen saturations were monitored continuously. The                            [id number]) Olympus gastroscope was                            introduced through the mouth, and advanced to the                            duodenal bulb. The upper GI endoscopy was                            accomplished without difficulty. Scope In: Scope Out: Findings:      [Grade] esophagitis [Bleeding] was found [cm from incisors].      The stomach was normal.      The examined duodenum was normal. Impression:               - [Grade] esophagitis [Bleeding].                           - Normal stomach.                           - Normal examined duodenum.                           -  No specimens collected. Recommendation:           - May administer water and medications now and may                            start tube feeds four hours post-procedure. Procedure Code(s):        --- Professional ---                           [phone], Esophagogastroduodenoscopy, flexible,                      transoral; diagnostic, including collection of                            specimen(s) by brushing or washing, when performed                            (separate procedure) Diagnosis Code(s):        --- Professional ---                           R13.10, Dysphagia, unspecified CPT copyright 2019 American Medical Association. All rights reserved. The codes documented in this report are preliminary and upon coder review may  be revised to meet current compliance requirements. Jesusita Oka,  10/14/2019 1:44:49 PM Number of Addenda: 0

## 2019-10-15 ENCOUNTER — Inpatient Hospital Stay (HOSPITAL_COMMUNITY): Payer: Medicare Other

## 2019-10-15 LAB — BASIC METABOLIC PANEL
Anion gap: 12 (ref 5–15)
Anion gap: 12 (ref 5–15)
BUN: 44 mg/dL — ABNORMAL HIGH (ref 8–23)
BUN: 45 mg/dL — ABNORMAL HIGH (ref 8–23)
CO2: 22 mmol/L (ref 22–32)
CO2: 20 mmol/L — ABNORMAL LOW (ref 22–32)
Calcium: 8.6 mg/dL — ABNORMAL LOW (ref 8.9–10.3)
Calcium: 8.6 mg/dL — ABNORMAL LOW (ref 8.9–10.3)
Chloride: 114 mmol/L — ABNORMAL HIGH (ref 98–111)
Chloride: 117 mmol/L — ABNORMAL HIGH (ref 98–111)
Creatinine, Ser: 1.31 mg/dL — ABNORMAL HIGH (ref 0.61–1.24)
Creatinine, Ser: 1.35 mg/dL — ABNORMAL HIGH (ref 0.61–1.24)
GFR calc Af Amer: >60 mL/min (ref >60)
GFR calc Af Amer: >60 mL/min (ref >60)
GFR calc non Af Amer: 57 mL/min — ABNORMAL LOW (ref >60)
GFR calc non Af Amer: 55 mL/min — ABNORMAL LOW (ref >60)
Glucose, Bld: 153 mg/dL — ABNORMAL HIGH (ref 70–99)
Glucose, Bld: 116 mg/dL — ABNORMAL HIGH (ref 70–99)
Potassium: 3.3 mmol/L — ABNORMAL LOW (ref 3.5–5.1)
Potassium: 3.7 mmol/L (ref 3.5–5.1)
Sodium: 148 mmol/L — ABNORMAL HIGH (ref 135–145)
Sodium: 149 mmol/L — ABNORMAL HIGH (ref 135–145)

## 2019-10-15 LAB — CBC
HCT: 38.5 % — ABNORMAL LOW (ref 39.0–52.0)
HCT: 39.5 % (ref 39.0–52.0)
Hemoglobin: 12.7 g/dL — ABNORMAL LOW (ref 13.0–17.0)
Hemoglobin: 13.1 g/dL (ref 13.0–17.0)
MCH: 31 pg (ref 26.0–34.0)
MCH: 31 pg (ref 26.0–34.0)
MCHC: 33 g/dL (ref 30.0–36.0)
MCHC: 33.2 g/dL (ref 30.0–36.0)
MCV: 93.9 fL (ref 80.0–100.0)
MCV: 93.6 fL (ref 80.0–100.0)
Platelets: 478 10*3/uL — ABNORMAL HIGH (ref 150–400)
Platelets: 494 10*3/uL — ABNORMAL HIGH (ref 150–400)
RBC: 4.1 MIL/uL — ABNORMAL LOW (ref 4.22–5.81)
RBC: 4.22 MIL/uL (ref 4.22–5.81)
RDW: 15.2 % (ref 11.5–15.5)
RDW: 15.1 % (ref 11.5–15.5)
WBC: 17.5 10*3/uL — ABNORMAL HIGH (ref 4.0–10.5)
WBC: 20.1 10*3/uL — ABNORMAL HIGH (ref 4.0–10.5)
nRBC: 0 % (ref 0.0–0.2)
nRBC: 0 % (ref 0.0–0.2)

## 2019-10-15 LAB — URINALYSIS, COMPLETE (UACMP) WITH MICROSCOPIC
Bacteria, UA: NONE SEEN
Bilirubin Urine: NEGATIVE
Glucose, UA: NEGATIVE mg/dL
Hgb urine dipstick: NEGATIVE
Ketones, ur: NEGATIVE mg/dL
Leukocytes,Ua: NEGATIVE
Nitrite: NEGATIVE
Protein, ur: NEGATIVE mg/dL
Specific Gravity, Urine: 1.02 (ref 1.005–1.030)
pH: 5 (ref 5.0–8.0)

## 2019-10-15 LAB — GLUCOSE, CAPILLARY
Glucose-Capillary: 107 mg/dL — ABNORMAL HIGH (ref 70–99)
Glucose-Capillary: 164 mg/dL — ABNORMAL HIGH (ref 70–99)
Glucose-Capillary: 118 mg/dL — ABNORMAL HIGH (ref 70–99)
Glucose-Capillary: 133 mg/dL — ABNORMAL HIGH (ref 70–99)
Glucose-Capillary: 108 mg/dL — ABNORMAL HIGH (ref 70–99)
Glucose-Capillary: 150 mg/dL — ABNORMAL HIGH (ref 70–99)

## 2019-10-15 MED ORDER — SODIUM CHLORIDE 0.9 % IV SOLN
2.0000 g | Freq: Three times a day (TID) | INTRAVENOUS | Status: DC
  Administered 2019-10-15 – 2019-10-17 (×5): 2 g via INTRAVENOUS
  Filled 2019-10-15 (×8): qty 2

## 2019-10-15 MED ORDER — VANCOMYCIN HCL 750 MG/150ML IV SOLN
750.0000 mg | Freq: Two times a day (BID) | INTRAVENOUS | Status: DC
  Administered 2019-10-16: 750 mg via INTRAVENOUS
  Filled 2019-10-15 (×2): qty 150

## 2019-10-15 MED ORDER — POTASSIUM CHLORIDE 20 MEQ/15ML (10%) PO SOLN
20.0000 meq | Freq: Three times a day (TID) | ORAL | Status: AC
  Administered 2019-10-15 (×3): 20 meq
  Filled 2019-10-15 (×3): qty 15

## 2019-10-15 MED ORDER — VANCOMYCIN HCL 1500 MG/300ML IV SOLN
1500.0000 mg | Freq: Once | INTRAVENOUS | Status: AC
  Administered 2019-10-15: 1500 mg via INTRAVENOUS
  Filled 2019-10-15: qty 300

## 2019-10-15 MED ORDER — SODIUM CHLORIDE 0.9 % IV BOLUS: 1000.0000 mL | Freq: Once | INTRAVENOUS | Status: DC

## 2019-10-15 NOTE — Progress Notes (Signed)
Pharmacy Antibiotic Note  Riley Wagner is a 64 y.o. male admitted on 10/02/2019 with sepsis.  Pharmacy has been consulted for Vanc and Cefepime dosing. CrCl 53 ml/min.  Plan: Start Cefepime 2 gms IV q8hr Start Vanc 1500 mg IV x 1, then 750 mg IV q12hr (goal trough 15 - 20 mg/dl) Monitor renal function, clinical status, C&S and vanc levels as indicated.  Height: 5\' 7"  (170.2 cm) Weight: 75.3 kg (166 lb 0.1 oz) IBW/kg (Calculated) : 66.1  Temp (24hrs), Avg:98.9 F (37.2 C), Min:97.7 F (36.5 C), Max:100.5 F (38.1 C)  Recent Labs  Lab 10/10/19 0726 10/11/19 0227 10/12/19 0611 10/13/19 0704 10/15/19 0602  WBC 10.6* 11.3* 12.3* 16.0* 17.5*  CREATININE 1.34* 1.31* 1.43* 1.60* 1.31*    Estimated Creatinine Clearance: 52.6 mL/min (A) (by C-G formula based on SCr of 1.31 mg/dL (H)).    No Known Allergies  Antimicrobials this admission: Cefepime 5/29 >>  Vanc 5/29 >>   Thank you for allowing pharmacy to be a part of this patient's care.  Alanda Slim, PharmD, [facility] Clinical Pharmacist Please see AMION for all Pharmacists' Contact Phone Numbers 10/15/2019, 1:53 PM

## 2019-10-15 NOTE — Progress Notes (Signed)
Patient ID: Riley Wagner, male   DOB: June 25, 1955, 64 y.o.   MRN: [id number]  Patient appears comfortable At goal rate on tube feeds PEG site clean and dry  Will sign off.  Call for any questions.  Imogene Burn. Georgette Dover, MD, [facility] Surgery  General/ Trauma Surgery   10/15/2019 8:28 AM

## 2019-10-15 NOTE — Progress Notes (Signed)
**Note Riley-Identified via Obfuscation** STROKE TEAM PROGRESS NOTE   INTERVAL HISTORY Patient is on for blood pressure medications amlodipine, labetalol, hydralazine and lisinopril.  When given at the same time, he becomes extremely hypotensive albeit nonsymptomatic, systolic dropping as low as 61, I asked the nurse to recheck manually every time the blood pressure machine shows a reading that low.  We tried only giving him 2 of his medications at once today and he became hypotensive again.  We will hold labetalol and hydralazine and see what his blood pressure does today and may consider discontinuing those and giving amlodipine and lisinopril separately.  He is hydrated and he is producing urine, I do not believe that this is anything to do with his fluid status.His   Vitals:   10/15/19 0540 10/15/19 0600 10/15/19 0700 10/15/19 0800  BP: 126/69 104/73 (!) 77/45   Pulse: 80 78 61   Resp:  (!) 23 (!) 25   Temp:    98.7 F (37.1 C)  TempSrc:    Axillary  SpO2:  97% 94%   Weight:      Height:       CBC:  Recent Labs  Lab 10/13/19 0704 10/15/19 0602  WBC 16.0* 17.5*  HGB 13.4 12.7*  HCT 41.2 38.5*  MCV 93.8 93.9  PLT 397 123456*   Basic Metabolic Panel:  Recent Labs  Lab 10/13/19 0704 10/15/19 0602  NA 145 148*  K 4.0 3.3*  CL 112* 114*  CO2 21* 22  GLUCOSE 155* 153*  BUN 46* 44*  CREATININE 1.60* 1.31*  CALCIUM 8.8* 8.6*   IMAGING past 24 hours  No results found.  PHYSICAL EXAM  No changes to exam, stable,  General - Well nourished, well developed middle-aged African-American male, left eye has subconjunctival hemorrhage and redness but improved Ophthalmologic - fundi not visualized due to noncooperation.  Cardiovascular - Regular rate and rhythm.  Neuro -patient is sitting up in bed, awake and alert, global aphasia and not following commands. Eyes in left gaze preference position, not cross midline, able to track objects and sound on the left visual field, right neglect, right hemianopia, blinks to threat  on the left not on the right.  PERRL. Right facial droop, tongue protrusion not cooperative. Spontaneous and purposeful movement of LUE 4-/5 with finger grip 3/5 and LLE 3-/5, but no movement of RUE and RLE. Slight withdraw of RLE on pain stimulation. DTR 1+ and no babinski. Sensation, coordination not cooperative and gait not tested.   ASSESSMENT/PLAN [name] with history of HTN who fell the night prior to admission, presenting with right sided weakness and aphasia. Taken to IR for terminus Left ICA occlusion.  Stroke:   L MCA infarcts due to left M1 occlusion s/p unsuccessful L MCA thrombectomy - L MCA infarct likely secondary to L ICA large vessel disease.  Stroke:   Right MCA infarcts in setting of cocaine use and right ICA and MCA large vessel stenoses     Code Stroke CT head cytotoxic edema L insula and frontal operculum. Likely old L parietal cortical and subcortical abnormality. ASPECTS 8    CTA head & neck L ICA occlusion. Widespread atherosclerosis: B CCA to ICA bifurcation R 40%, L 50% w/ extensive soft plaques at bifurcation and bulbs, L 30-40%. Severe B ICA siphon stenoses. R MCA and B PCA atherosclerosis w/ narrowing & irregularity.  CT perfusion 105 penumbra L MCA territory.   Cerebral angio - 10/02/19 - Riley Rosario [name],  MD - diffuse and severe intracranial atherosclerosis. Proximal L M1 occlusion. Complete recanalization L M1 w/ embotrap w/ poor distal flow d/t severe L ICA stenosis s/p angioplasty. L ICA terminus coil embolization w/ sparing L anterior choroidal.  CT head repeat 5/16 large L basal ganglia hemorrhage/contrast posterior SDH and SAH. Large cytotoxic edema L MCA w/ 13mm midline shift.   CT head 5/17 diminishing SAH contrast. L MCA infarct w/o significant mass effect  CT head 5/18 diminishing SAH. L MCA infarct increased edema, MLS 49mm. Right parietal small new infarct.   MRI evolving B cerebral infarcts including large L MCA  infarct w/ edema and 58mm R midline shift, right parietal and frontal infarcts. No significant hemorrhage.  MRA No flow L carotid systeme following thrombectomy. Advanced intracranial atherosclerosis including moderate R ICA, mild R M1, severe proximal R M2 and severe L V4 stenoses.    CT repeat 5/21 L>R infarct w/o progression. 1cm R midline shift. No hemorrhage   CT repeat 5/23 - Continued interval evolution of left greater than right ischemic infarcts with unchanged 1cm of left-to-right shift.   2D Echo EF 55-60%   TG 471 and direct LDL 95.3, goal < 70  HgbA1c 5.9   UDS + cocaine and THC  lovenox 40 for VTE prophylaxis  No antithrombotic prior to admission, now on ASA 325mg  daily. S/p PEG, will add plavix now that PEG is completed for B ICA severe stenoses  Therapy recommendations:  SNF   Disposition: awaiting bed offer  Code status - partial - DNI  Cerebral Edema  Present on admission scan  Repeat CT head 5/17 large left MCA infarct with edema but no significant midline shift  CT 5/18 repeat L MCA infarct increased edema, MLS 63mm. Right parietal small new infarct.  MRI 5/19 - large L MCA infarct with MLS 76mm  CT 5/21 - stable b/l infarct with MLS 1cm  CT 5/23 - unchanged 10 mm of left-to-right shift.   Off 3% now, S/p 23.4% saline x 1 -> 3% bolus x 1     Na normalized  Acute hypoxic respiratory failure with compromised airway Atelectasis   Intubated for IR, continued d/t compromised airway  CCM on board  On vent - extubated  On propofol -> off  CXR 5/21 - increased left lung base consolidation/atelectasis. pulm vasc congestion  Extubated Friday 5/21  Chest PT  NT suctioning  Respiratory status stabilized past few days  Fever  Pneumonia, likely aspiration  Tmax 101.3->100.5->98.7  Leukocytosis - 16.0->17.5  UA - 10/10/19 - 30 protein, o/w ok   CXR 10/07/19 - Increased atelectasis or consolidation at the LEFT lung base. Pulmonary vascular  congestion.  CXR 10/08/19 - Poor inspiratory effort with left basilar atelectasis.  Sputum culture 5/18 normal respiratory flora  Blood culture 5/22  No growth   PICC out over weekend d/t temp. Has PIV  Tylenol / motrin prn   Unasyn - 5/22>>5/27 - currently on no antibiotics but elevated WBCs will order infectious screening workup  CCM on board, now signed off  Hypertensive Emergency  SBP > 220 on arrival  Home meds:  Amlodipine 10, coreg 3.125 bid, HCTZ 25, lisinopril 40  Off cleviprex now  On amlodipine 10, clonidine 0.1 tid, hydralazine 100 q8, labetalol 300 Q8, lisinopril 40, hygroton 25 - held for significant hypotension when administering the medications may need to discontinue some or spread out so not taking at the same time . SBP goal < 180 per Dr. Ashley Mariner . BP  110-130s . Long-term BP goal normotensive  Hyperlipidemia  Home meds:  lipitor 40, resumed in hospital  Direct LDL 95.3, goal < 70  On lipitor 40  Continue statin at discharge  Dysphagia . Secondary to stroke . NPO . Cortrak placed - 10/07/19 . PEG placement 5/28 -> tube feedings restarted . Speech on board  Cocaine abuse  UDS positive for cocaine  Cessation education will be provided   Other Stroke Risk Factors  Advanced age  Former Cigarette smoker, quit 5 yrs ago  ETOH use, advised to drink no more than 2 drink(s) a day  THC abuse - UDS:  THC POSITIVE, cessation education will be provided.  Family hx stroke (mother)  Coronary artery disease, MI  Other Active Problems  CKD stage II Cre 1.43->1.6->1.31  TG elevated at 181 (10/06/19)    Left eye conjunctivitis and subconjunctival hemorrhage - Ciprofloxacin drops started 5/21->completed  Constipation - Dulcolax sppositories  Hyperglycemia -> SSI  Lethargy. Amantadine 100 bid added 10/13/19  Hypokalemia - 3.3 -> replete  Hypotension - 77/45 -> hold Norvasc ; Zestril ; Labetalol ; hydralazine and Chlorthalidone and monitor  BP.    Hospital day # 70  Personally examined patient and images, and have participated in and made any corrections needed to history, physical, neuro exam,assessment and plan as stated above.  I have personally obtained the history, evaluated lab date, reviewed imaging studies and agree with radiology interpretations.    Sarina Ill, MD Stroke Neurology   A total of 25 minutes was spent for the care of this patient, spent on counseling patient and family on different diagnostic and therapeutic options, counseling and coordination of care, riskd ans benefits of management, compliance, or risk factor reduction and education.    To contact Stroke Continuity provider, please refer to [email or web address] After hours, contact General Neurology

## 2019-10-16 ENCOUNTER — Inpatient Hospital Stay (HOSPITAL_COMMUNITY): Payer: Medicare Other

## 2019-10-16 LAB — BASIC METABOLIC PANEL
Anion gap: 14 (ref 5–15)
BUN: 44 mg/dL — ABNORMAL HIGH (ref 8–23)
CO2: 20 mmol/L — ABNORMAL LOW (ref 22–32)
Calcium: 8.6 mg/dL — ABNORMAL LOW (ref 8.9–10.3)
Chloride: 116 mmol/L — ABNORMAL HIGH (ref 98–111)
Creatinine, Ser: 1.39 mg/dL — ABNORMAL HIGH (ref 0.61–1.24)
GFR calc Af Amer: >60 mL/min (ref >60)
GFR calc non Af Amer: 53 mL/min — ABNORMAL LOW (ref >60)
Glucose, Bld: 146 mg/dL — ABNORMAL HIGH (ref 70–99)
Potassium: 3.3 mmol/L — ABNORMAL LOW (ref 3.5–5.1)
Sodium: 150 mmol/L — ABNORMAL HIGH (ref 135–145)

## 2019-10-16 LAB — GLUCOSE, CAPILLARY
Glucose-Capillary: 111 mg/dL — ABNORMAL HIGH (ref 70–99)
Glucose-Capillary: 123 mg/dL — ABNORMAL HIGH (ref 70–99)
Glucose-Capillary: 131 mg/dL — ABNORMAL HIGH (ref 70–99)
Glucose-Capillary: 143 mg/dL — ABNORMAL HIGH (ref 70–99)
Glucose-Capillary: 152 mg/dL — ABNORMAL HIGH (ref 70–99)
Glucose-Capillary: 154 mg/dL — ABNORMAL HIGH (ref 70–99)

## 2019-10-16 LAB — CBC
HCT: 38.1 % — ABNORMAL LOW (ref 39.0–52.0)
Hemoglobin: 12.4 g/dL — ABNORMAL LOW (ref 13.0–17.0)
MCH: 30.6 pg (ref 26.0–34.0)
MCHC: 32.5 g/dL (ref 30.0–36.0)
MCV: 94.1 fL (ref 80.0–100.0)
Platelets: 543 10*3/uL — ABNORMAL HIGH (ref 150–400)
RBC: 4.05 MIL/uL — ABNORMAL LOW (ref 4.22–5.81)
RDW: 15.2 % (ref 11.5–15.5)
WBC: 16.9 10*3/uL — ABNORMAL HIGH (ref 4.0–10.5)
nRBC: 0 % (ref 0.0–0.2)

## 2019-10-16 MED ORDER — VANCOMYCIN HCL 750 MG/150ML IV SOLN
750.0000 mg | Freq: Two times a day (BID) | INTRAVENOUS | Status: DC
  Administered 2019-10-16 – 2019-10-17 (×2): 750 mg via INTRAVENOUS
  Filled 2019-10-16 (×2): qty 150

## 2019-10-16 NOTE — Progress Notes (Signed)
Upon assessment, patient's oxygen saturation was 91% on 4L nasal cannula. There was audible gurgling in the back of patient's throat with every breath. RN nasal suctioned patient and cleared a lot of secretions. Patient still audibly gurgling after suctioning. Auscultated breath sounds and noticed coarse crackling and rhonchi throughout all lobes. Notified Neuro MD. New order to obtain a STAT chest xray. Will continue to monitor.

## 2019-10-16 NOTE — Progress Notes (Signed)
**Note Riley-Identified via Obfuscation** STROKE TEAM PROGRESS NOTE   INTERVAL HISTORY  Patient was hypotensive yesterday, leukocytosis, held BP meds, infectious workup to date neg, started empiric antibiotics, will also check stool for cdiff today. Otherwise stable, BP was acceptable overnight, still holding meds, patient's neuro exam stable. If blood pressure continues to increase we can slowly restart his BP meds(amlodipine, labetalol, hydralazine and lisinopril)  for now labetalol prn for SBP > 160.   Vitals:   10/16/19 0400 10/16/19 0430 10/16/19 0500 10/16/19 0530  BP: 118/77 (!) 138/93 (!) 164/97 132/88  Pulse: 87 87 88 86  Resp: (!) 24 (!) 23 19 (!) 23  Temp: (!) 100.6 F (38.1 C)     TempSrc: Axillary     SpO2: 96% 96% 97% 97%  Weight:   75.1 kg   Height:       CBC:  Recent Labs  Lab 10/15/19 0602 10/15/19 1913  WBC 17.5* 20.1*  HGB 12.7* 13.1  HCT 38.5* 39.5  MCV 93.9 93.6  PLT 478* [id number]*   Basic Metabolic Panel:  Recent Labs  Lab 10/15/19 0602 10/15/19 1913  NA 148* 149*  K 3.3* 3.7  CL 114* 117*  CO2 22 20*  GLUCOSE 153* 116*  BUN 44* 45*  CREATININE 1.31* 1.35*  CALCIUM 8.6* 8.6*   IMAGING past 24 hours  DG Chest Port 1 View  Result Date: 10/15/2019 CLINICAL DATA:  Fever, leukocytosis EXAM: PORTABLE CHEST 1 VIEW COMPARISON:  Chest radiograph 10/08/2019 FINDINGS: Stable cardiomediastinal contours. Interval removal of an orogastric tube and left upper extremity PICC. Low lung volumes. There are a few linear opacities at the left base likely atelectasis or scarring. No new focal consolidation. No pneumothorax or significant pleural effusion. No acute finding in the visualized skeleton. IMPRESSION: Minimal linear opacities at the left base likely atelectasis or scarring. No evidence of active disease. Electronically Signed   By: Audie Pinto M.D.   On: 10/15/2019 14:14   DG Abd Portable 1V  Result Date: 10/15/2019 CLINICAL DATA:  Fever and leukocytosis EXAM: PORTABLE ABDOMEN - 1 VIEW COMPARISON:   Oct 02, 2019 FINDINGS: There is mild stool in the colon. There is no bowel dilatation or air-fluid level to suggest bowel obstruction. No free air. There are apparent phleboliths in the pelvis. IMPRESSION: No bowel obstruction or free air evident. Electronically Signed   By: Lowella Grip III M.D.   On: 10/15/2019 14:14    PHYSICAL EXAM Patient is well-nourished, well-developed, African-American male, conjunctival hemorrhage and redness is improved, could not visualize fundi due to noncooperation, regular rate rhythm, lungs clear, patient sitting up in bed, awake and alert, global aphasia, not following commands, left-sided gaze, does not cross the midline, right neglect, right hemianopia, right facial droop, pupils equally round and reactive to light, tongue protrusion not cooperative, spontaneous and purposeful movement of left upper extremity less so in the left lower extremity, on the right withdraws to stim upper and lower extremity, starting to develop increased tone in that right upper extremity, toes are equivocal, cannot test coordination due to noncooperation and gait not tested.  ASSESSMENT/PLAN [name] with history of HTN who fell the night prior to admission, presenting with right sided weakness and aphasia. Taken to IR for terminus Left ICA occlusion.  Stroke:   L MCA infarcts due to left M1 occlusion s/p unsuccessful L MCA thrombectomy - L MCA infarct likely secondary to L ICA large vessel disease.  Stroke:   Right MCA infarcts in  setting of cocaine use and right ICA and MCA large vessel stenoses     Code Stroke CT head cytotoxic edema L insula and frontal operculum. Likely old L parietal cortical and subcortical abnormality. ASPECTS 8    CTA head & neck L ICA occlusion. Widespread atherosclerosis: B CCA to ICA bifurcation R 40%, L 50% w/ extensive soft plaques at bifurcation and bulbs, L 30-40%. Severe B ICA siphon stenoses. R MCA and B PCA  atherosclerosis w/ narrowing & irregularity.  CT perfusion 105 penumbra L MCA territory.   Cerebral angio - 10/02/19 - Riley Rosario [name], MD - diffuse and severe intracranial atherosclerosis. Proximal L M1 occlusion. Complete recanalization L M1 w/ embotrap w/ poor distal flow d/t severe L ICA stenosis s/p angioplasty. L ICA terminus coil embolization w/ sparing L anterior choroidal.  CT head repeat 5/16 large L basal ganglia hemorrhage/contrast posterior SDH and SAH. Large cytotoxic edema L MCA w/ 25mm midline shift.   CT head 5/17 diminishing SAH contrast. L MCA infarct w/o significant mass effect  CT head 5/18 diminishing SAH. L MCA infarct increased edema, MLS 49mm. Right parietal small new infarct.   MRI evolving B cerebral infarcts including large L MCA infarct w/ edema and 7mm R midline shift, right parietal and frontal infarcts. No significant hemorrhage.  MRA No flow L carotid systeme following thrombectomy. Advanced intracranial atherosclerosis including moderate R ICA, mild R M1, severe proximal R M2 and severe L V4 stenoses.    CT repeat 5/21 L>R infarct w/o progression. 1cm R midline shift. No hemorrhage   CT repeat 5/23 - Continued interval evolution of left greater than right ischemic infarcts with unchanged 1cm of left-to-right shift.   2D Echo EF 55-60%   TG 471 and direct LDL 95.3, goal < 70  HgbA1c 5.9   UDS + cocaine and THC  lovenox 40 for VTE prophylaxis  No antithrombotic prior to admission, now on ASA 325mg  daily. S/p PEG, will add plavix now that PEG is completed for B ICA severe stenoses  Therapy recommendations:  SNF   Disposition: awaiting bed offer  Code status - partial - DNI  Cerebral Edema  Present on admission scan  Repeat CT head 5/17 large left MCA infarct with edema but no significant midline shift  CT 5/18 repeat L MCA infarct increased edema, MLS 84mm. Right parietal small new infarct.  MRI 5/19 - large L MCA infarct with MLS  96mm  CT 5/21 - stable b/l infarct with MLS 1cm  CT 5/23 - unchanged 10 mm of left-to-right shift.   Off 3% now, S/p 23.4% saline x 1 -> 3% bolus x 1     Na normalized  Acute hypoxic respiratory failure with compromised airway Atelectasis   Intubated for IR, continued d/t compromised airway  CCM on board  On vent - extubated  On propofol -> off  CXR 5/21 - increased left lung base consolidation/atelectasis. pulm vasc congestion  Extubated Friday 5/21  Chest PT  NT suctioning  Respiratory status stabilized past few days  Fever  Pneumonia, likely aspiration  Tmax 101.3->100.5->98.7  Leukocytosis - 16.0->17.5  UA - 10/10/19 - 30 protein, o/w ok   CXR 10/07/19 - Increased atelectasis or consolidation at the LEFT lung base. Pulmonary vascular congestion.  CXR 10/08/19 - Poor inspiratory effort with left basilar atelectasis.  Sputum culture 5/18 normal respiratory flora  Blood culture 5/22  No growth   PICC out over weekend d/t temp. Has PIV  Tylenol / motrin prn  Unasyn - 5/22>>5/27 - currently on no antibiotics but elevated WBCs will order infectious screening workup  CCM on board, now signed off  Sepsis  Hypotension - all scheduled BP meds D/C'd 5/29 - (Labetalol prn) - 77/45 - 131/72 - 118/77 - 138/93 - 164/97 - 132/88 (SBP goal < 180)  Vancomycin and Cefepime started 5/29   Leukocytosis - 16.0->17.5->20.1 - 16.9 - pending   Temp - 100.5->98.7->100.6  Blood cultures 5/29 - pending  Respiratory cultures 5/29 - pending  UA - unremarkable  CXR - 5/29 - Minimal linear opacities at the left base likely atelectasis or scarring. No evidence of active disease.  Abd X-ray - 5/29 - No bowel obstruction or free air evident.   Hypertensive Emergency  SBP > 220 on arrival  Home meds:  Amlodipine 10, coreg 3.125 bid, HCTZ 25, lisinopril 40, holding at this time due to hypotension, has labetalol prn  Off cleviprex now  On amlodipine 10, clonidine 0.1  tid, hydralazine 100 q8, labetalol 300 Q8, lisinopril 40, hygroton 25 - held for significant hypotension  . SBP goal < 180 per Dr. Ashley Mariner . BP 110-130s . Long-term BP goal normotensive  Hyperlipidemia  Home meds:  lipitor 40, resumed in hospital  Direct LDL 95.3, goal < 70  On lipitor 40  Continue statin at discharge  Dysphagia . Secondary to stroke . NPO . Cortrak placed - 10/07/19 . PEG placement 5/28 -> tube feedings restarted . Speech on board  Cocaine abuse  UDS positive for cocaine  Cessation education will be provided   Other Stroke Risk Factors  Advanced age  Former Cigarette smoker, quit 5 yrs ago  ETOH use, advised to drink no more than 2 drink(s) a day  THC abuse - UDS:  THC POSITIVE, cessation education will be provided.  Family hx stroke (mother)  Coronary artery disease, MI  Other Active Problems  CKD stage II Cre 1.43->1.6->1.31->1.35-> 1.39    TG elevated at 181 (10/06/19)    Left eye conjunctivitis and subconjunctival hemorrhage - Ciprofloxacin drops started 5/21->completed and improved  Constipation - Dulcolax sppositories  Hyperglycemia -> SSI  Lethargy. Amantadine 100 bid added 10/13/19  Hypokalemia - 3.3 -> replete ->3.7-> 3.3 -> replete    Hospital day # 14   Patient has been hypotensive,labile, with leukocytosis, held BP meds, infectious workup to date neg, started empiric antibiotics, . Otherwise stable, BP was acceptable overnight, still holding meds, patient's neuro exam stable.  Patient's white blood cells improved today, neurologically he appears stable.  He has had loose stools, will monitor his white blood cells and if he does not improve consider ordering C. difficile.  I called ID today and they stated if I wanted to order C. difficile testing I would have to call chief medical officer.  I spoke with PCCM attending on 4 N. and discussed the case, since patient is actually improved on the current antibiotics we will hold  and monitor.  Of note, yesterday when patient was hypotensive with leukocytosis I did try to reconsult CCM however they stated they were quite busy, had patients in the emergency room, they referred me to [facility] (I told him patient was in the neuro ICU), they suggested I instead call pharmacy and ask for an pharmacist for help with selection of antibiotics; at that time patient despite having hypotension appeared asymptomatic so we continued with holding his blood pressure medications, ordered a bolus which he ultimately did not need, and started him on empiric antibiotics.  We  will continue to follow.    If blood pressure continues to increase we can slowly restart his BP meds(amlodipine, labetalol, hydralazine and lisinopril)  for now labetalol prn for SBP > 160.    Personally examined patient and images, and have participated in and made any corrections needed to history, physical, neuro exam,assessment and plan as stated above.  I have personally obtained the history, evaluated lab date, reviewed imaging studies and agree with radiology interpretations.    Sarina Ill, MD Stroke Neurology   A total of 30 minutes was spent for the care of this patient, spent on counseling patient and family on different diagnostic and therapeutic options, counseling and coordination of care, riskd ans benefits of management, compliance, or risk factor reduction and education.  This patient is critically ill and at significant risk of neurological worsening, death and care requires constant monitoring of vital signs, hemodynamics,respiratory and cardiac monitoring,review of multiple databases, neurological assessment, discussion with family, other specialists and medical decision making of high complexity.I  I spent 30 minutes of neurocritical care time in the care of this patient.  Sarina Ill, MD [facility]     To contact Stroke Continuity provider, please refer to [email or web address] After  hours, contact General Neurology

## 2019-10-17 LAB — CULTURE, RESPIRATORY W GRAM STAIN
Culture: NORMAL
Gram Stain: NONE SEEN

## 2019-10-17 LAB — CBC
HCT: 41.3 % (ref 39.0–52.0)
Hemoglobin: 13.4 g/dL (ref 13.0–17.0)
MCH: 30.9 pg (ref 26.0–34.0)
MCHC: 32.4 g/dL (ref 30.0–36.0)
MCV: 95.2 fL (ref 80.0–100.0)
Platelets: 636 10*3/uL — ABNORMAL HIGH (ref 150–400)
RBC: 4.34 MIL/uL (ref 4.22–5.81)
RDW: 15.4 % (ref 11.5–15.5)
WBC: 16.8 10*3/uL — ABNORMAL HIGH (ref 4.0–10.5)
nRBC: 0 % (ref 0.0–0.2)

## 2019-10-17 LAB — COMPREHENSIVE METABOLIC PANEL
ALT: 282 U/L — ABNORMAL HIGH (ref 0–44)
AST: 128 U/L — ABNORMAL HIGH (ref 15–41)
Albumin: 2.3 g/dL — ABNORMAL LOW (ref 3.5–5.0)
Alkaline Phosphatase: 106 U/L (ref 38–126)
Anion gap: 11 (ref 5–15)
BUN: 40 mg/dL — ABNORMAL HIGH (ref 8–23)
CO2: 18 mmol/L — ABNORMAL LOW (ref 22–32)
Calcium: 9 mg/dL (ref 8.9–10.3)
Chloride: 119 mmol/L — ABNORMAL HIGH (ref 98–111)
Creatinine, Ser: 1.33 mg/dL — ABNORMAL HIGH (ref 0.61–1.24)
GFR calc Af Amer: >60 mL/min (ref >60)
GFR calc non Af Amer: 56 mL/min — ABNORMAL LOW (ref >60)
Glucose, Bld: 152 mg/dL — ABNORMAL HIGH (ref 70–99)
Potassium: 3.4 mmol/L — ABNORMAL LOW (ref 3.5–5.1)
Sodium: 148 mmol/L — ABNORMAL HIGH (ref 135–145)
Total Bilirubin: 0.5 mg/dL (ref 0.3–1.2)
Total Protein: 6.8 g/dL (ref 6.5–8.1)

## 2019-10-17 LAB — GLUCOSE, CAPILLARY
Glucose-Capillary: 120 mg/dL — ABNORMAL HIGH (ref 70–99)
Glucose-Capillary: 116 mg/dL — ABNORMAL HIGH (ref 70–99)
Glucose-Capillary: 122 mg/dL — ABNORMAL HIGH (ref 70–99)
Glucose-Capillary: 137 mg/dL — ABNORMAL HIGH (ref 70–99)
Glucose-Capillary: 119 mg/dL — ABNORMAL HIGH (ref 70–99)

## 2019-10-17 MED ORDER — CLOPIDOGREL BISULFATE 75 MG PO TABS
75.0000 mg | ORAL_TABLET | Freq: Every day | ORAL | Status: DC
  Administered 2019-10-17: 75 mg via ORAL
  Filled 2019-10-17: qty 1

## 2019-10-17 MED ORDER — CLOPIDOGREL BISULFATE 75 MG PO TABS
75.0000 mg | ORAL_TABLET | Freq: Every day | ORAL | Status: DC
  Administered 2019-10-18 – 2019-10-26 (×9): 75 mg
  Filled 2019-10-17 (×9): qty 1

## 2019-10-17 MED ORDER — LABETALOL HCL 5 MG/ML IV SOLN: 10.0000 mg | INTRAVENOUS | Status: DC | PRN

## 2019-10-17 MED ORDER — LABETALOL HCL 5 MG/ML IV SOLN
10.0000 mg | INTRAVENOUS | Status: DC | PRN
  Filled 2019-10-17: qty 4

## 2019-10-17 MED ORDER — SODIUM CHLORIDE 0.9 % IV SOLN
INTRAVENOUS | Status: DC | PRN
  Administered 2019-10-17 – 2019-10-20 (×2): 500 mL via INTRAVENOUS

## 2019-10-17 MED ORDER — SODIUM CHLORIDE 0.9 % IV SOLN
2.0000 g | Freq: Two times a day (BID) | INTRAVENOUS | Status: DC
  Administered 2019-10-17 – 2019-10-20 (×6): 2 g via INTRAVENOUS
  Filled 2019-10-17 (×7): qty 2

## 2019-10-17 MED ORDER — ENOXAPARIN SODIUM 40 MG/0.4ML ~~LOC~~ SOLN
40.0000 mg | SUBCUTANEOUS | Status: DC
  Administered 2019-10-17 – 2019-10-20 (×4): 40 mg via SUBCUTANEOUS
  Filled 2019-10-17 (×4): qty 0.4

## 2019-10-17 NOTE — Progress Notes (Addendum)
**Note Riley-Identified via Obfuscation** STROKE TEAM PROGRESS NOTE   INTERVAL HISTORY No family at bedside, he is stable, no changes overnight, transferred out of Neuro ICU to step down.   Vitals:   10/17/19 0800 10/17/19 0900 10/17/19 1030 10/17/19 1229  BP: (!) 137/102 (!) 143/88 (!) 148/95 (!) 147/80  Pulse: 86 92 98 82  Resp: 18 (!) 22 19 (!) 22  Temp: 100.1 F (37.8 C)  98.5 F (36.9 C) 98.1 F (36.7 C)  TempSrc: Axillary  Axillary Axillary  SpO2: 95% 95% 97% 97%  Weight:      Height:       CBC:  Recent Labs  Lab 10/16/19 0630 10/17/19 0937  WBC 16.9* 16.8*  HGB 12.4* 13.4  HCT 38.1* 41.3  MCV 94.1 95.2  PLT 543* [id number]   Basic Metabolic Panel:  Recent Labs  Lab 10/16/19 0630 10/17/19 0937  NA 150* 148*  K 3.3* 3.4*  CL 116* 119*  CO2 20* 18*  GLUCOSE 146* 152*  BUN 44* 40*  CREATININE 1.39* 1.33*  CALCIUM 8.6* 9.0   IMAGING past 24 hours  DG CHEST PORT 1 VIEW  Result Date: 10/16/2019 CLINICAL DATA:  Cough with multiple comorbidity. EXAM: PORTABLE CHEST 1 VIEW COMPARISON:  Radiograph yesterday FINDINGS: Slightly improved lung volumes from yesterday. Improving bibasilar atelectasis. Mild cardiomegaly with unchanged mediastinal contours. No pneumothorax or significant effusion. No confluent airspace disease. Stable osseous structures. IMPRESSION: Improving lung volumes and bibasilar atelectasis. No new abnormality. Electronically Signed   By: Keith Rake M.D.   On: 10/16/2019 20:46    PHYSICAL EXAM;: Patient is well-nourished, well-developed, African-American male, conjunctival hemorrhage and redness is resolved, could not visualize fundi due to noncooperation, regular rate rhythm, patient sitting up in bed, awake and alert, global aphasia, not following commands, left-sided gaze, does not cross the midline, right neglect, right hemianopia, right facial droop, pupils equally round and reactive to light, tongue protrusion not cooperative, spontaneous and purposeful movement of left upper extremity  less so in the left lower extremity, on the right withdraws to stim upper and lower extremity, starting to develop increased tone in that right upper extremity, toes are equivocal, cannot test coordination due to noncooperation and gait not tested.  ASSESSMENT/PLAN [name] with history of HTN who fell the night prior to admission, presenting with right sided weakness and aphasia. Taken to IR for terminus Left ICA occlusion.  Stroke:   L MCA infarcts due to left M1 occlusion s/p unsuccessful L MCA thrombectomy - L MCA infarct likely secondary to L ICA large vessel disease.  Stroke:   Right MCA infarcts in setting of cocaine use and right ICA and MCA large vessel stenoses     Code Stroke CT head cytotoxic edema L insula and frontal operculum. Likely old L parietal cortical and subcortical abnormality. ASPECTS 8    CTA head & neck L ICA occlusion. Widespread atherosclerosis: B CCA to ICA bifurcation R 40%, L 50% w/ extensive soft plaques at bifurcation and bulbs, L 30-40%. Severe B ICA siphon stenoses. R MCA and B PCA atherosclerosis w/ narrowing & irregularity.  CT perfusion 105 penumbra L MCA territory.   Cerebral angio - 10/02/19 - Riley Rosario [name], MD - diffuse and severe intracranial atherosclerosis. Proximal L M1 occlusion. Complete recanalization L M1 w/ embotrap w/ poor distal flow d/t severe L ICA stenosis s/p angioplasty. L ICA terminus coil embolization w/ sparing L anterior choroidal.  CT head repeat 5/16 large L basal ganglia  hemorrhage/contrast posterior SDH and SAH. Large cytotoxic edema L MCA w/ 26mm midline shift.   CT head 5/17 diminishing SAH contrast. L MCA infarct w/o significant mass effect  CT head 5/18 diminishing SAH. L MCA infarct increased edema, MLS 50mm. Right parietal small new infarct.   MRI evolving B cerebral infarcts including large L MCA infarct w/ edema and 54mm R midline shift, right parietal and frontal infarcts. No  significant hemorrhage.  MRA No flow L carotid systeme following thrombectomy. Advanced intracranial atherosclerosis including moderate R ICA, mild R M1, severe proximal R M2 and severe L V4 stenoses.    CT repeat 5/21 L>R infarct w/o progression. 1cm R midline shift. No hemorrhage   CT repeat 5/23 - Continued interval evolution of left greater than right ischemic infarcts with unchanged 1cm of left-to-right shift.   2D Echo EF 55-60%   TG 471 and direct LDL 95.3, goal < 70  HgbA1c 5.9   UDS + cocaine and THC  lovenox 40 for VTE prophylaxis  No antithrombotic prior to admission, now on ASA 325mg  daily. added plavix now that PEG is completed for B ICA severe stenoses. Continue DAPT x 3 months then aspirin alone.   Therapy recommendations:  SNF   Disposition: pending   Code status - partial - DNI  Transfer to step-down  Will get [facility] to help [place] medically manage him  Cerebral Edema, resolving  Present on admission scan  Repeat CT head 5/17 large left MCA infarct with edema but no significant midline shift  CT 5/18 repeat L MCA infarct increased edema, MLS 58mm. Right parietal small new infarct.  MRI 5/19 - large L MCA infarct with MLS 41mm  CT 5/21 - stable b/l infarct with MLS 1cm  CT 5/23 - unchanged 10 mm of left-to-right shift.   Off 3% now, S/p 23.4% saline x 1 -> 3% bolus x 1     Na normalized  Acute hypoxic respiratory failure with compromised airway, resolved  Intubated for IR, continued d/t compromised airway  On propofol -> off  CXR 5/21 - increased left lung base consolidation/atelectasis. pulm vasc congestion  Extubated Friday 5/21  Chest PT  NT suctioning  Respiratory status stabilized past few days  Fever  Pneumonia, likely aspiration  Tmax 100.8  Leukocytosis - 16.8  UA - 10/10/19 - 30 protein, o/w ok   CXR 10/07/19 - Increased atelectasis or consolidation at the LEFT lung base. Pulmonary vascular congestion.  CXR 10/08/19 - Poor  inspiratory effort with left basilar atelectasis.  Sputum culture 5/18 normal respiratory flora  Blood culture 5/22  No growth   PICC out over weekend d/t temp. Has PIV  Tylenol / motrin prn   Unasyn - 5/22>>5/27   Sepsis  Hypotension - all scheduled BP meds D/C'd 5/29   Vancomycin 5/29>>5/31 (neg MRSA)  Cefepime 5/29 >>  Leukocytosis -  16.8  Temp - 100.8  Blood cultures 5/29 - no growth  Respiratory cultures 5/29 - rare GNR  UA - unremarkable  CXR - 5/29 - Minimal linear opacities at the left base likely atelectasis or scarring. No evidence of active disease.  Abd X-ray - 5/29 - No bowel obstruction or free air evident.  Hypertensive Emergency  SBP > 220 on arrival  Home meds:  Amlodipine 10, coreg 3.125 bid, HCTZ 25, lisinopril 40, holding at this time due to hypotension, has labetalol prn  Off cleviprex now  On amlodipine 10, clonidine 0.1 tid, hydralazine 100 q8, labetalol 300 Q8, lisinopril 40,  hygroton 25 - held for significant hypotension 5/29  . SBP goal < 180  . Long-term BP goal normotensive  Hyperlipidemia  Home meds:  lipitor 40, resumed in hospital  Direct LDL 95.3, goal < 70  On lipitor 40  Continue statin at discharge  Dysphagia . Secondary to stroke . NPO . Cortrak placed - 10/07/19 . PEG placement 5/28 -> tube feedings restarted . Speech on board  Cocaine abuse  UDS positive for cocaine  Cessation education will be provided   Other Stroke Risk Factors  Advanced age  Former Cigarette smoker, quit 5 yrs ago  ETOH use, advised to drink no more than 2 drink(s) a day  THC abuse - UDS:  THC POSITIVE, cessation education will be provided.  Family hx stroke (mother)  Coronary artery disease, MI  Other Active Problems  CKD stage II Cre 1.33  TG elevated at 181 (10/06/19)    Left eye conjunctivitis and subconjunctival hemorrhage - Ciprofloxacin drops started 5/21->completed and improved  Constipation - Dulcolax  sppositories  Hyperglycemia -> SSI  Lethargy. Amantadine 100 bid added 10/13/19  Hypokalemia - 3.4 - replete    Hospital day # 15   Patient has been hypotensive,labile, with leukocytosis, held BP meds, infectious workup to date neg, started empiric antibiotics, . Otherwise stable, BP was acceptable overnight, still holding meds, patient's neuro exam stable.  Patient's white blood cells improved today, neurologically he appears stable.  He has had loose stools, will monitor his white blood cells and if he does not improve consider ordering C. difficile.  I called ID today and they stated if I wanted to order C. difficile testing I would have to call chief medical officer.  I spoke with PCCM attending on 4 N. and discussed the case, since patient is actually improved on the current antibiotics we will hold and monitor.  Of note, when patient was hypotensive with leukocytosis I did try to reconsult CCM however they stated they were quite busy, had patients in the emergency room, they referred me to [facility] (I told him patient was in the neuro ICU), they suggested I instead call pharmacy and ask for an pharmacist for help with selection of antibiotics; at that time patient, despite having concerning hypotension, appeared asymptomatic so we continued with holding his blood pressure medications, ordered a bolus which he ultimately did not need, and started him on empiric antibiotics.  We will continue to follow.  If blood pressure continues to increase we can slowly restart his BP meds(amlodipine, labetalol, hydralazine and lisinopril)  for now labetalol prn for SBP > 160.   I spoke with [facility] today for advice on his leukocytosis, they stated they likely did not have anything further after reviewing chart and we should monitor since he is stable and WBCs continue to decline. Will monitor, if needed they suggested we call infectious disease for consult. We will continue to monitor, patient is stable  neurologically today.  Personally examined patient and images, and have participated in and made any corrections needed to history, physical, neuro exam,assessment and plan as stated above.  I have personally obtained the history, evaluated lab date, reviewed imaging studies and agree with radiology interpretations.   Sarina Ill, MD Stroke Neurology  A total of 25 minutes was spent for the care of this patient, spent on counseling patient and family on different diagnostic and therapeutic options, counseling and coordination of care, riskd ans benefits of management, compliance, or risk factor reduction and education.  Sarina Ill,  MD [facility]     To contact Stroke Continuity provider, please refer to [email or web address] After hours, contact General Neurology

## 2019-10-17 NOTE — Progress Notes (Signed)
Pt transferred to 4NP11 no difficulties. Called pt's daughter Judson Roch and informed of pt's transfer.

## 2019-10-17 NOTE — Progress Notes (Signed)
PHARMACY NOTE:  ANTIMICROBIAL RENAL DOSAGE ADJUSTMENT  Current antimicrobial regimen includes a mismatch between antimicrobial dosage and estimated renal function.  As per policy approved by the Pharmacy & Therapeutics and Medical Executive Committees, the antimicrobial dosage will be adjusted accordingly.  Current antimicrobial dosage:  Cefepime 2g IV q8h  Indication: r/o sepsis  Renal Function:  Estimated Creatinine Clearance: 51.8 mL/min (A) (by C-G formula based on SCr of 1.33 mg/dL (H)).    Antimicrobial dosage has been changed to:  Cefepime 2g IVq12h   Arturo Morton, PharmD, BCPS Please check AMION for all [employer] contact numbers Clinical Pharmacist 10/17/2019 10:51 AM

## 2019-10-17 NOTE — Progress Notes (Signed)
Physical Therapy Treatment Patient Details Name: Riley Wagner MRN: [id number] DOB: May 12, 1956 Today's Date: 10/17/2019    History of Present Illness 64 y.o. male with a history of hypertension who was last known well prior to bed 5/15. He lost conciousness last night and fell, but refused transport with BP of [id number] systolic and negative stroke screen by EMS. On 5/16 pt with R weakness and apahasia on awakening. Pt found to have large penumbra on CT and taken for emergent IR thrombectomy of L MCA followed by L ICA coil embolization 2/2 bleeding. Pt extubated on 5/21.    PT Comments    Patient progressing slowly towards goals.  Noted today improvement in following commands to push against and pull toward with L UE as well as for head to midline with multimodal cues in sitting.  He did not initiate today as well as last session for sit to stand trials, but more able to manage his secretions and noted audible swallow.  PT to follow acutely.  Continue to recommend SNF level rehab at d/c.    Follow Up Recommendations  SNF;Supervision/Assistance - 24 hour     Equipment Recommendations  Wheelchair (measurements PT);Wheelchair cushion (measurements PT);Hospital bed    Recommendations for Other Services       Precautions / Restrictions Precautions Precautions: Fall Precaution Comments: [id number] systolically; R HP    Mobility  Bed Mobility Overal bed mobility: Needs Assistance Bed Mobility: Rolling;Sidelying to Sit;Sit to Supine Rolling: Max assist;+2 for safety/equipment Sidelying to sit: Max assist;+2 for physical assistance   Sit to supine: Max assist;+2 for physical assistance   General bed mobility comments: rolled to L in bed with assist to engage hemiparetic side and assist for lifting trunk upright, to supine assist for legs and trunk, noted bed soiled from rectal tube so rolled to clean and change linens with nursing A  Transfers Overall transfer level: Needs assistance Equipment  used: 2 person hand held assist Transfers: Sit to/from Stand Sit to Stand: Total assist;+2 physical assistance         General transfer comment: up from EOB pt not engaging this session to extend trunk and L LE to come upright after two trials  Ambulation/Gait                 Stairs             Wheelchair Mobility    Modified Rankin (Stroke Patients Only) Modified Rankin (Stroke Patients Only) Pre-Morbid Rankin Score: No symptoms Modified Rankin: Severe disability     Balance Overall balance assessment: Needs assistance Sitting-balance support: Feet supported Sitting balance-Leahy Scale: Poor Sitting balance - Comments: able to sit with S for about 5 seconds at times, but leans to R and posterior.  Patient pushing with L UE to R at times as well     Standing balance-Leahy Scale: Zero                              Cognition Arousal/Alertness: Awake/alert Behavior During Therapy: Flat affect Overall Cognitive Status: Difficult to assess Area of Impairment: Attention;Safety/judgement;Following commands;Awareness;Problem solving                       Following Commands: Follows one step commands inconsistently;Follows one step commands with increased time Safety/Judgement: Decreased awareness of safety;Decreased awareness of deficits   Problem Solving: Slow processing;Requires verbal cues;Requires tactile cues;Decreased initiation General Comments: followed commands about 50% of  the time and able to bring eyes to midline x 1      Exercises Other Exercises Other Exercises: seated trunk rotation stretch; supine R hip ER/Abductor stretch    General Comments General comments (skin integrity, edema, etc.): patient with audible swallow this session and noted no secretions falling out of his mouth, did have some audible crackles as if mucous in his throat, did not clear on cue      Pertinent Vitals/Pain Pain Assessment: Faces Faces Pain  Scale: No hurt    Home Living                      Prior Function            PT Goals (current goals can now be found in the care plan section) Progress towards PT goals: Progressing toward goals    Frequency    Min 3X/week      PT Plan Current plan remains appropriate    Co-evaluation              AM-PAC PT "6 Clicks" Mobility   Outcome Measure  Help needed turning from your back to your side while in a flat bed without using bedrails?: Total Help needed moving from lying on your back to sitting on the side of a flat bed without using bedrails?: Total Help needed moving to and from a bed to a chair (including a wheelchair)?: Total Help needed standing up from a chair using your arms (e.g., wheelchair or bedside chair)?: Total Help needed to walk in hospital room?: Total Help needed climbing 3-5 steps with a railing? : Total 6 Click Score: 6    End of Session Equipment Utilized During Treatment: Oxygen Activity Tolerance: Patient tolerated treatment well Patient left: in bed;with call bell/phone within reach;with bed alarm set   PT Visit Diagnosis: Other abnormalities of gait and mobility (R26.89);Muscle weakness (generalized) (M62.81);Hemiplegia and hemiparesis;Other symptoms and signs involving the nervous system (R29.898) Hemiplegia - Right/Left: Right Hemiplegia - caused by: Cerebral infarction     Time: [id number] PT Time Calculation (min) (ACUTE ONLY): 36 min  Charges:  $Therapeutic Activity: 23-37 mins                     Magda Kiel, [place] [phone] 10/17/2019    Reginia Naas 10/17/2019, 5:22 PM

## 2019-10-18 ENCOUNTER — Inpatient Hospital Stay (HOSPITAL_COMMUNITY): Payer: Medicare Other

## 2019-10-18 DIAGNOSIS — E876 Hypokalemia: Secondary | ICD-10-CM

## 2019-10-18 DIAGNOSIS — R7989 Other specified abnormal findings of blood chemistry: Secondary | ICD-10-CM

## 2019-10-18 LAB — URINALYSIS, COMPLETE (UACMP) WITH MICROSCOPIC
Bilirubin Urine: NEGATIVE
Glucose, UA: NEGATIVE mg/dL
Hgb urine dipstick: NEGATIVE
Ketones, ur: NEGATIVE mg/dL
Leukocytes,Ua: NEGATIVE
Nitrite: NEGATIVE
Protein, ur: 30 mg/dL — AB
Specific Gravity, Urine: 1.027 (ref 1.005–1.030)
pH: 5 (ref 5.0–8.0)

## 2019-10-18 LAB — GLUCOSE, CAPILLARY
Glucose-Capillary: 134 mg/dL — ABNORMAL HIGH (ref 70–99)
Glucose-Capillary: 149 mg/dL — ABNORMAL HIGH (ref 70–99)
Glucose-Capillary: 152 mg/dL — ABNORMAL HIGH (ref 70–99)
Glucose-Capillary: 144 mg/dL — ABNORMAL HIGH (ref 70–99)
Glucose-Capillary: 102 mg/dL — ABNORMAL HIGH (ref 70–99)
Glucose-Capillary: 131 mg/dL — ABNORMAL HIGH (ref 70–99)

## 2019-10-18 LAB — COMPREHENSIVE METABOLIC PANEL
ALT: 327 U/L — ABNORMAL HIGH (ref 0–44)
AST: 163 U/L — ABNORMAL HIGH (ref 15–41)
Albumin: 2.1 g/dL — ABNORMAL LOW (ref 3.5–5.0)
Alkaline Phosphatase: 112 U/L (ref 38–126)
Anion gap: 11 (ref 5–15)
BUN: 35 mg/dL — ABNORMAL HIGH (ref 8–23)
CO2: 20 mmol/L — ABNORMAL LOW (ref 22–32)
Calcium: 8.8 mg/dL — ABNORMAL LOW (ref 8.9–10.3)
Chloride: 118 mmol/L — ABNORMAL HIGH (ref 98–111)
Creatinine, Ser: 1.36 mg/dL — ABNORMAL HIGH (ref 0.61–1.24)
GFR calc Af Amer: >60 mL/min (ref >60)
GFR calc non Af Amer: 54 mL/min — ABNORMAL LOW (ref >60)
Glucose, Bld: 141 mg/dL — ABNORMAL HIGH (ref 70–99)
Potassium: 3.4 mmol/L — ABNORMAL LOW (ref 3.5–5.1)
Sodium: 149 mmol/L — ABNORMAL HIGH (ref 135–145)
Total Bilirubin: 0.4 mg/dL (ref 0.3–1.2)
Total Protein: 6.5 g/dL (ref 6.5–8.1)

## 2019-10-18 LAB — CBC WITH DIFFERENTIAL/PLATELET
Abs Immature Granulocytes: 0.28 10*3/uL — ABNORMAL HIGH (ref 0.00–0.07)
Basophils Absolute: 0.1 10*3/uL (ref 0.0–0.1)
Basophils Relative: 1 %
Eosinophils Absolute: 0.2 10*3/uL (ref 0.0–0.5)
Eosinophils Relative: 1 %
HCT: 41.1 % (ref 39.0–52.0)
Hemoglobin: 13.3 g/dL (ref 13.0–17.0)
Immature Granulocytes: 2 %
Lymphocytes Relative: 9 %
Lymphs Abs: 1.6 10*3/uL (ref 0.7–4.0)
MCH: 30.3 pg (ref 26.0–34.0)
MCHC: 32.4 g/dL (ref 30.0–36.0)
MCV: 93.6 fL (ref 80.0–100.0)
Monocytes Absolute: 1.9 10*3/uL — ABNORMAL HIGH (ref 0.1–1.0)
Monocytes Relative: 11 %
Neutro Abs: 12.9 10*3/uL — ABNORMAL HIGH (ref 1.7–7.7)
Neutrophils Relative %: 76 %
Platelets: 676 10*3/uL — ABNORMAL HIGH (ref 150–400)
RBC: 4.39 MIL/uL (ref 4.22–5.81)
RDW: 15.2 % (ref 11.5–15.5)
WBC: 17 10*3/uL — ABNORMAL HIGH (ref 4.0–10.5)
nRBC: 0 % (ref 0.0–0.2)

## 2019-10-18 MED ORDER — SODIUM CHLORIDE 3 % IN NEBU
4.0000 mL | INHALATION_SOLUTION | Freq: Two times a day (BID) | RESPIRATORY_TRACT | Status: DC
  Administered 2019-10-18 – 2019-10-26 (×16): 4 mL via RESPIRATORY_TRACT
  Filled 2019-10-18 (×16): qty 4

## 2019-10-18 MED ORDER — SODIUM CHLORIDE 3 % IN NEBU
4.0000 mL | INHALATION_SOLUTION | RESPIRATORY_TRACT | Status: DC
  Filled 2019-10-18 (×3): qty 4

## 2019-10-18 MED ORDER — POTASSIUM CHLORIDE 20 MEQ/15ML (10%) PO SOLN
40.0000 meq | ORAL | Status: AC
  Administered 2019-10-18 (×2): 40 meq via ORAL
  Filled 2019-10-18 (×2): qty 30

## 2019-10-18 MED ORDER — FREE WATER
200.0000 mL | Freq: Four times a day (QID) | Status: DC
  Administered 2019-10-18 – 2019-10-21 (×12): 200 mL

## 2019-10-18 NOTE — Progress Notes (Signed)
Occupational Therapy Treatment Patient Details Name: Maryann Ceballo MRN: [id number] DOB: [birth year] Today's Date: 10/18/2019    History of present illness 64 y.o. male with a history of hypertension who was last known well prior to bed 5/15. He lost conciousness last night and fell, but refused transport with BP of 99991111 systolic and negative stroke screen by EMS. On 5/16 pt with R weakness and apahasia on awakening. Pt found to have large penumbra on CT and taken for emergent IR thrombectomy of L MCA followed by L ICA coil embolization 2/2 bleeding. Pt extubated on 5/21.   OT comments  Pt continues to present with R sided weakness, decreased sitting balance, impaired functional mobility, aphasia, and apraxia impacting pts ability to engage in BADLs. Pt completed bed mobility MAX A +2 to transition supine>EOB. Pt able to sit EOB initially with MOD A however progressing to min guard. Pt continues to present with apraxia therefore trialed repetition of lateral leans R<>L with pt needing MAX A to return to midline. Pt noted to be able to brush teeth with no paste/ water with initial  hand over hand assist but progressing to supervision with L hand. Pt likely to have some visual neglect on R side however pt able to attend to watching deep sea fishing video on phone when device positioned to pts L side. Continue to recommend SNF for continued therapies. Will follow acutely.    Follow Up Recommendations  SNF;Supervision/Assistance - 24 hour    Equipment Recommendations  Wheelchair (measurements OT);Wheelchair cushion (measurements OT);Hospital bed    Recommendations for Other Services      Precautions / Restrictions Precautions Precautions: Fall Precaution Comments: [id number] systolically; R HP Restrictions Weight Bearing Restrictions: No       Mobility Bed Mobility Overal bed mobility: Needs Assistance Bed Mobility: Rolling;Sidelying to Sit;Sit to Supine Rolling: Max assist;+2 for  safety/equipment Sidelying to sit: Max assist;+2 for physical assistance;HOB elevated   Sit to supine: Mod assist;+2 for physical assistance;+2 for safety/equipment   General bed mobility comments: exited bed to pts L side with pt needing MAX A +2 to elevate trunk and scoot hips to EOB; no initiation noted when attempting to scoot hips out. Pt noted to begin to return to supine from sitting needing MOD A +2 to return to supine. Pt completed rollingR<>L with MAX A+2 to attempting to reach with LUE in sidelying however unable to maintain sidelying without MAX A  Transfers                 General transfer comment: defer    Balance Overall balance assessment: Needs assistance Sitting-balance support: Feet supported;Bilateral upper extremity supported;No upper extremity supported Sitting balance-Leahy Scale: Fair Sitting balance - Comments: pt initially needing MOD A for sitting balance however progresssing to intermittent MIN guard.                                   ADL either performed or assessed with clinical judgement   ADL Overall ADL's : Needs assistance/impaired     Grooming: Wash/dry face;Sitting;Total assistance Grooming Details (indicate cue type and reason): attempted hand over hand assist with pt resistant to support; pt did initiate using toothbrush with initial hand over hand progressing to supervision; would require total A for UB grooming             Lower Body Dressing: Bed level;Total assistance     Toilet Transfer Details (indicate  cue type and reason): unable to attempt d/t safety Toileting- Clothing Manipulation and Hygiene: +2 for physical assistance;Bed level;Total assistance Toileting - Clothing Manipulation Details (indicate cue type and reason): post incontinent BM     Functional mobility during ADLs: Maximal assistance;+2 for physical assistance;+2 for safety/equipment(bed mobility only) General ADL Comments: pt continues to present  with R sided weakness, aphasia, and apraxia impacting pts ability to engage in BADLs.     Vision       Perception     Praxis      Cognition Arousal/Alertness: Awake/alert Behavior During Therapy: Flat affect Overall Cognitive Status: Difficult to assess Area of Impairment: Safety/judgement;Following commands;Awareness;Problem solving                       Following Commands: Follows one step commands inconsistently;Follows one step commands with increased time Safety/Judgement: Decreased awareness of safety;Decreased awareness of deficits Awareness: Intellectual Problem Solving: Slow processing;Requires verbal cues;Requires tactile cues;Decreased initiation;Difficulty sequencing General Comments: daughter present and reports pt has not responded at atll to her. Following commands < 50% of time. did attend to fishing video on phone        Exercises     Shoulder Instructions       General Comments pt with persistent secretions during session. Daughter present during session. Pt with BM during session. Encouraged daughter to complete PROM on RUE. RUE developing mild tone in elbow and digits; OTR ordered resting hand splint for pt during session.     Pertinent Vitals/ Pain       Pain Assessment: Faces Faces Pain Scale: No hurt  Home Living                                          Prior Functioning/Environment              Frequency  Min 2X/week        Progress Toward Goals  OT Goals(current goals can now be found in the care plan section)  Progress towards OT goals: Progressing toward goals  Acute Rehab OT Goals Patient Stated Goal: to improve sitting balance and bed mobility- PT goal OT Goal Formulation: Patient unable to participate in goal setting Time For Goal Achievement: 10/22/19 Potential to Achieve Goals: [place] Discharge plan remains appropriate    Co-evaluation                 AM-PAC OT "6 Clicks" Daily  Activity     Outcome Measure   Help from another person eating meals?: Total Help from another person taking care of personal grooming?: Total Help from another person toileting, which includes using toliet, bedpan, or urinal?: Total Help from another person bathing (including washing, rinsing, drying)?: Total Help from another person to put on and taking off regular upper body clothing?: Total Help from another person to put on and taking off regular lower body clothing?: Total 6 Click Score: 6    End of Session    OT Visit Diagnosis: Unsteadiness on feet (R26.81);Other abnormalities of gait and mobility (R26.89);Muscle weakness (generalized) (M62.81);Other symptoms and signs involving cognitive function;Hemiplegia and hemiparesis Hemiplegia - Right/Left: Right Hemiplegia - caused by: Cerebral infarction   Activity Tolerance Patient tolerated treatment well   Patient Left in bed;with call bell/phone within reach;with family/visitor present rolled on pts R side   Nurse Communication Mobility status;Other (comment)(had BM;  needs condom cath; persistent secretions)        Time: [id number] OT Time Calculation (min): 59 min  Charges: OT General Charges $OT Visit: 1 Visit OT Treatments $Self Care/Home Management : 23-37 mins $Therapeutic Activity: 23-37 mins  Lanier Clam., COTA/L Acute Rehabilitation Services [phone]    Ihor Gully 10/18/2019, 2:53 PM

## 2019-10-18 NOTE — TOC Progression Note (Signed)
Transition of Care [facility]) - Progression Note    Patient Details  Name: Riley Wagner MRN: [id number] Date of Birth: 09/24/1955  Transition of Care [facility]) CM/SW [place], [place] Phone Number: 10/18/2019, 1:11 PM  Clinical Narrative:    CSW left voicemail for [facility] admissions liaison at [phone]. Unclear if pt had been accepted or not at their SNF. Per previous notes pt family searching for alternates for transportation to SNF. From my understanding as pt is going out of our service area the trip may not be covered by Medicare.   CSW awaits return call from SNF.   Expected Discharge Plan: Skilled Nursing Facility Barriers to Discharge: Continued Medical Work up, SNF Pending bed offer  Expected Discharge Plan and Services Expected Discharge Plan: [place]  Readmission Risk Interventions No flowsheet data found.

## 2019-10-18 NOTE — Progress Notes (Signed)
STROKE TEAM PROGRESS NOTE   INTERVAL HISTORY Pt daughter at the bedside. Pt lying in bed, lethargic, still has mild respiratory distress and difficulty clearance of secretions.  Gurgly sound in the throat.  Temperature this morning 100.5, continued on cefepime.  Will check CXR, UA, put on chest PT and nebulization.  Vitals:   10/18/19 0322 10/18/19 0417 10/18/19 0600 10/18/19 0734  BP: (!) 149/95   (!) 145/81  Pulse: (!) 102  79 75  Resp: (!) 24  (!) 30 (!) 26  Temp:  (!) 100.5 F (38.1 C) 100 F (37.8 C) 97.7 F (36.5 C)  TempSrc:  Oral Oral   SpO2: 97%  96% 100%  Weight:      Height:       CBC:  Recent Labs  Lab 10/16/19 0630 10/17/19 0937  WBC 16.9* 16.8*  HGB 12.4* 13.4  HCT 38.1* 41.3  MCV 94.1 95.2  PLT 543* [id number]*   Basic Metabolic Panel:  Recent Labs  Lab 10/17/19 0937 10/18/19 0705  NA 148* 149*  K 3.4* 3.4*  CL 119* 118*  CO2 18* 20*  GLUCOSE 152* 141*  BUN 40* 35*  CREATININE 1.33* 1.36*  CALCIUM 9.0 8.8*   IMAGING past 24 hours  DG CHEST PORT 1 VIEW  Result Date: 10/18/2019 CLINICAL DATA:  Fever EXAM: PORTABLE CHEST 1 VIEW COMPARISON:  10/16/2019 FINDINGS: Low volume chest with streaky opacity at the left base. There is no edema, consolidation, effusion, or pneumothorax. Normal heart size and mediastinal contours. IMPRESSION: Low volume chest with atelectasis or mild infiltrate at the left base. Electronically Signed   By: Monte Fantasia M.D.   On: 10/18/2019 10:47    PHYSICAL EXAM  Patient is well-nourished, well-developed, African-American male, could not visualize fundi due to noncooperation, regular rate rhythm, awake with eyes open, global aphasia, not following commands, left-sided gaze preference, does not cross the midline, right neglect, right hemianopia, right facial droop, pupil right 62mm left 40mm and reactive to light, tongue protrusion not cooperative, spontaneous and purposeful movement of left upper extremity less so in the left lower  extremity, on the right withdraws to stim upper and lower extremity, starting to develop increased tone in that right upper extremity, toes are equivocal, cannot test coordination due to noncooperation and gait not tested.  ASSESSMENT/PLAN Riley Wagner is a 64 y.o. male with history of HTN who fell the night prior to admission, presenting with right sided weakness and aphasia. Taken to IR for terminus Left ICA occlusion.  Stroke:   L MCA infarcts due to left M1 occlusion s/p unsuccessful L MCA thrombectomy - L MCA infarct likely secondary to L ICA large vessel disease.  Stroke:   Right MCA infarcts in setting of cocaine use and right ICA and MCA large vessel stenoses     Code Stroke CT head cytotoxic edema L insula and frontal operculum. Likely old L parietal cortical and subcortical abnormality. ASPECTS 8    CTA head & neck L ICA occlusion. Widespread atherosclerosis: B CCA to ICA bifurcation R 40%, L 50% w/ extensive soft plaques at bifurcation and bulbs, L 30-40%. Severe B ICA siphon stenoses. R MCA and B PCA atherosclerosis w/ narrowing & irregularity.  CT perfusion 105 penumbra L MCA territory.   Cerebral angio - 10/02/19 - [name], MD - diffuse and severe intracranial atherosclerosis. Proximal L M1 occlusion. Complete recanalization L M1 w/ embotrap w/ poor distal flow d/t severe L ICA stenosis s/p angioplasty. L ICA  terminus coil embolization w/ sparing L anterior choroidal.  CT head repeat 5/16 large L basal ganglia hemorrhage/contrast posterior SDH and SAH. Large cytotoxic edema L MCA w/ 66mm midline shift.   CT head 5/17 diminishing SAH contrast. L MCA infarct w/o significant mass effect  CT head 5/18 diminishing SAH. L MCA infarct increased edema, MLS 41mm. Right parietal small new infarct.   MRI evolving B cerebral infarcts including large L MCA infarct w/ edema and 94mm R midline shift, right parietal and frontal infarcts. No significant hemorrhage.  MRA No  flow L carotid systeme following thrombectomy. Advanced intracranial atherosclerosis including moderate R ICA, mild R M1, severe proximal R M2 and severe L V4 stenoses.    CT repeat 5/21 L>R infarct w/o progression. 1cm R midline shift. No hemorrhage   CT repeat 5/23 - Continued interval evolution of left greater than right ischemic infarcts with unchanged 1cm of left-to-right shift.   CT repeat 6/1 - continued interval evolution of left large infarct, MLS at 69mm  2D Echo EF 55-60%   TG 471 and direct LDL 95.3, goal < 70  HgbA1c 5.9   UDS + cocaine and THC  lovenox 40 for VTE prophylaxis  No antithrombotic prior to admission, now on ASA 325mg  and plavix for B ICA severe stenoses. Continue DAPT x 3 months then aspirin alone.   Therapy recommendations:  SNF   Disposition: pending   Code status - partial - DNI  Cerebral Edema, resolving  Present on admission scan  Repeat CT head 5/17 large left MCA infarct with edema but no significant midline shift  CT 5/18 repeat L MCA infarct increased edema, MLS 64mm. Right parietal small new infarct.  MRI 5/19 - large L MCA infarct with MLS 14mm  CT 5/21 - stable b/l infarct with MLS 1cm  CT 5/23 - unchanged 10 mm of left-to-right shift.   CT 6/1 - 9mm left to right MLS  Na 149   Acute hypoxic respiratory failure with compromised airway, resolved  Intubated for IR, continued d/t compromised airway  On propofol -> off  CXR 5/21 - increased left lung base consolidation/atelectasis. pulm vasc congestion  CXR 6/1 - Low volume chest with atelectasis or mild infiltrate at the left base.  Extubated 5/21  Still has difficulty with oral secretions  Sepsis  Fever  Pneumonia, likely aspiration  Tmax 100.8-> 100.5  Leukocytosis - 16.8->17.0  UA - 10/18/19 neg  CXR 6/1 - Low volume chest with atelectasis or mild infiltrate at the left base.  Sputum culture 5/18 normal respiratory flora  Blood culture 5/29  NGTD   PICC out  over weekend d/t temp. Has PIV  Tylenol / motrin prn   Unasyn - 5/22>>5/27   Cefepime 5/29>>  Hypertensive Emergency hypotension  SBP > 220 on arrival  Home meds:  Amlodipine 10, coreg 3.125 bid, HCTZ 25, lisinopril 40, holding at this time due to hypotension, has labetalol prn  Off cleviprex now  On amlodipine 10, clonidine 0.1 tid, hydralazine 100 q8, labetalol 300 Q8, lisinopril 40, hygroton 25 - d/c'ed for significant hypotension 5/29  . BP stable 140s . SBP goal < 180  . Long-term BP goal normotensive  Hyperlipidemia  Home meds:  lipitor 40, resumed in hospital  Direct LDL 95.3, goal < 70  On lipitor 40 - d/c'ed 10/18/19 due to elevated LFTs  Continue statin at discharge  Dysphagia . Secondary to stroke . NPO . PEG placement 5/28 -> on tube feedings  . on free  water 200 q6h  . Speech on board  Elevated LFTs  AST/ALT -128/282 -> 163/327  Repeat in am pending  Hepatitis panel pending   Statin discontinued for now  Cocaine abuse  UDS positive for cocaine  Cessation education will be provided   Other Stroke Risk Factors  Advanced age  Former Cigarette smoker, quit 5 yrs ago  ETOH use, advised to drink no more than 2 drink(s) a day  THC abuse - UDS:  THC POSITIVE, cessation education will be provided.  Family hx stroke (mother)  Coronary artery disease, MI  Other Active Problems  CKD stage II Cre 1.39->1.33->1.36  Left eye conjunctivitis and subconjunctival hemorrhage - Ciprofloxacin drops started 5/21->completed and improved  Constipation - Dulcolax sppositories  Hyperglycemia -> SSI  Lethargy. Amantadine 100 bid added 10/13/19  Hypokalemia - 3.4 - replete  Hospital day # 16   Patient condition worsened within the last 24 hours, has developed fever, increased oral secretions and difficulty clearance of oral secretions, continues to have mild SOB and leukocytosis, and I ordered chest PT, 3% saline nebulization, NT suctioning, CXR and  UA, continued cefepime. I spent  35 minutes in total face-to-face time with the patient, more than 50% of which was spent in counseling and coordination of care, reviewing test results, images and medication, and discussing the diagnosis, treatment plan and potential prognosis. This patient's care requiresreview of multiple databases, neurological assessment, discussion with family, other specialists and medical decision making of high complexity. I had long discussion with daughter at bedside, updated pt current condition, treatment plan and potential prognosis, and answered all the questions.  She expressed understanding and appreciation.   Rosalin Hawking, MD PhD Stroke Neurology 10/18/2019 7:59 PM   To contact Stroke Continuity provider, please refer to [email or web address] After hours, contact General Neurology

## 2019-10-18 NOTE — Progress Notes (Signed)
Pharmacy Antibiotic Note  Riley Wagner is a 64 y.o. male admitted on 10/02/2019 with new CVA and now with increased WBC/fevers on 5/29 concerning for sepsis. Pharmacy consulted for Cefepime dosing.    SCr 1.33, CrCl~50-60 ml/min.   Plan: - Continue Cefepime 2g IV every 12 hours - Will continue to follow renal function, culture results, LOT, and antibiotic de-escalation plans   Height: 5\' 7"  (170.2 cm) Weight: 75.3 kg (166 lb 0.1 oz) IBW/kg (Calculated) : 66.1  Temp (24hrs), Avg:98.9 F (37.2 C), Min:97.7 F (36.5 C), Max:100.5 F (38.1 C)  Recent Labs  Lab 10/13/19 0704 10/15/19 0602 10/15/19 1913 10/16/19 0630 10/17/19 0937  WBC 16.0* 17.5* 20.1* 16.9* 16.8*  CREATININE 1.60* 1.31* 1.35* 1.39* 1.33*    Estimated Creatinine Clearance: 51.8 mL/min (A) (by C-G formula based on SCr of 1.33 mg/dL (H)).    No Known Allergies  Cefepime 5/29>> Vanc 5/29>>5/31 Unasyn 5/22 >>5/29 Cipro eye drops 5/21 >> 5/26  5/18 TA: GPC - normal flora 5/16 MRSA PCR: neg 5/16 COVID: neg 5/22 BCx >> neg 5/29 Resp Cx - rare GNR >> NOF 5/29 Bld - ngx2d  Thank you for allowing pharmacy to be a part of this patients care.  Alycia Rossetti, PharmD, BCPS Clinical Pharmacist Clinical phone for 10/18/2019: [phone] 10/18/2019 7:52 AM   **Pharmacist phone directory can now be found on [email or web address] ([email or web address]).  Listed under [place].

## 2019-10-18 NOTE — Progress Notes (Signed)
  Speech Language Pathology Treatment: Dysphagia;Cognitive-Linquistic  Patient Details Name: Riley Wagner MRN: [id number] DOB: [birth year] Today's Date: 10/18/2019 Time: [id number] SLP Time Calculation (min) (ACUTE ONLY): 12 min  Assessment / Plan / Recommendation Clinical Impression  Pt continues to have limited but improving bolus acceptance, taking a small amount of water and two ice chips today. Mod tactile cues were given to open his mouth and then seal his lips around the spoon. He still loses ice chips anteriorly, but he initiated mastication for the two that stayed in his oral cavity. There is also hyoid movement to suggest a swallow response, but he sounds audible wet and coughs after each one. He appears to have a significant oropharyngeal dysphagia, but might benefit from [facility] before he discharges to SNF if his acceptance increases. He also still needs Max cues for intermittent command following. No verbalizations are noted throughout session. Will continue to follow.    HPI HPI: 64 yo male former smoker brought to ER on 5/16 with hypertensive crisis (BP 212/110), Rt sided weakness and aphasia.  Found to have Lt terminal ICA occlusion.  Intubated for airway protection 5/16-21.  Dx left MCA infarct due to left MI occlusion s/p unsuccessful L MCA thrombectomy; Right MCA infarcts in setting of cocaine use and right ICA and MCA large vessel stenoses.  CT 5/21 showed stable bilateral infarcts with midline shift of one cm.           SLP Plan  Continue with current plan of care       Recommendations  Diet recommendations: NPO Medication Administration: Via alternative means                Oral Care Recommendations: Oral care QID Follow up Recommendations: Skilled Nursing facility SLP Visit Diagnosis: Aphasia (R47.01);Dysphagia, unspecified (R13.10) Plan: Continue with current plan of care       GO                 Osie Bond., M.A. [facility] Pager  [phone] Office [phone]  10/18/2019, 1:32 PM

## 2019-10-18 NOTE — Progress Notes (Signed)
Orthopedic Tech Progress Note Patient Details:  Riley Wagner 01-07-1956 [id number] Called in order to [employer] for a RESTING HAND SPLINT Patient ID: Riley Wagner, male   DOB: 21-Aug-1955, 64 y.o.   MRN: [id number]   Janit Pagan 10/18/2019, 3:46 PM

## 2019-10-19 ENCOUNTER — Inpatient Hospital Stay (HOSPITAL_COMMUNITY): Payer: Medicare Other

## 2019-10-19 DIAGNOSIS — A419 Sepsis, unspecified organism: Secondary | ICD-10-CM

## 2019-10-19 DIAGNOSIS — Z978 Presence of other specified devices: Secondary | ICD-10-CM

## 2019-10-19 DIAGNOSIS — I9589 Other hypotension: Secondary | ICD-10-CM

## 2019-10-19 DIAGNOSIS — R05 Cough: Secondary | ICD-10-CM

## 2019-10-19 DIAGNOSIS — R6521 Severe sepsis with septic shock: Secondary | ICD-10-CM

## 2019-10-19 DIAGNOSIS — R0603 Acute respiratory distress: Secondary | ICD-10-CM

## 2019-10-19 DIAGNOSIS — J9600 Acute respiratory failure, unspecified whether with hypoxia or hypercapnia: Secondary | ICD-10-CM

## 2019-10-19 LAB — CBC
HCT: 45.6 % (ref 39.0–52.0)
Hemoglobin: 15.1 g/dL (ref 13.0–17.0)
MCH: 30.9 pg (ref 26.0–34.0)
MCHC: 33.1 g/dL (ref 30.0–36.0)
MCV: 93.4 fL (ref 80.0–100.0)
Platelets: 688 10*3/uL — ABNORMAL HIGH (ref 150–400)
RBC: 4.88 MIL/uL (ref 4.22–5.81)
RDW: 15.5 % (ref 11.5–15.5)
WBC: 22 10*3/uL — ABNORMAL HIGH (ref 4.0–10.5)
nRBC: 0 % (ref 0.0–0.2)

## 2019-10-19 LAB — HEPATITIS PANEL, ACUTE
HCV Ab: NONREACTIVE
Hep A IgM: NONREACTIVE
Hep B C IgM: NONREACTIVE
Hepatitis B Surface Ag: NONREACTIVE

## 2019-10-19 LAB — BASIC METABOLIC PANEL
Anion gap: 12 (ref 5–15)
BUN: 35 mg/dL — ABNORMAL HIGH (ref 8–23)
CO2: 18 mmol/L — ABNORMAL LOW (ref 22–32)
Calcium: 9.1 mg/dL (ref 8.9–10.3)
Chloride: 116 mmol/L — ABNORMAL HIGH (ref 98–111)
Creatinine, Ser: 1.38 mg/dL — ABNORMAL HIGH (ref 0.61–1.24)
GFR calc Af Amer: >60 mL/min (ref >60)
GFR calc non Af Amer: 53 mL/min — ABNORMAL LOW (ref >60)
Glucose, Bld: 167 mg/dL — ABNORMAL HIGH (ref 70–99)
Potassium: 4.9 mmol/L (ref 3.5–5.1)
Sodium: 146 mmol/L — ABNORMAL HIGH (ref 135–145)

## 2019-10-19 LAB — HEPATIC FUNCTION PANEL
ALT: 414 U/L — ABNORMAL HIGH (ref 0–44)
AST: 169 U/L — ABNORMAL HIGH (ref 15–41)
Albumin: 2.4 g/dL — ABNORMAL LOW (ref 3.5–5.0)
Alkaline Phosphatase: 135 U/L — ABNORMAL HIGH (ref 38–126)
Bilirubin, Direct: 0.2 mg/dL (ref 0.0–0.2)
Indirect Bilirubin: 0.5 mg/dL (ref 0.3–0.9)
Total Bilirubin: 0.7 mg/dL (ref 0.3–1.2)
Total Protein: 7.4 g/dL (ref 6.5–8.1)

## 2019-10-19 LAB — GLUCOSE, CAPILLARY
Glucose-Capillary: 176 mg/dL — ABNORMAL HIGH (ref 70–99)
Glucose-Capillary: 167 mg/dL — ABNORMAL HIGH (ref 70–99)
Glucose-Capillary: 179 mg/dL — ABNORMAL HIGH (ref 70–99)
Glucose-Capillary: 145 mg/dL — ABNORMAL HIGH (ref 70–99)
Glucose-Capillary: 174 mg/dL — ABNORMAL HIGH (ref 70–99)

## 2019-10-19 MED ORDER — ROCURONIUM BROMIDE 50 MG/5ML IV SOLN
100.0000 mg | Freq: Once | INTRAVENOUS | Status: AC
  Administered 2019-10-19: 100 mg via INTRAVENOUS
  Filled 2019-10-19: qty 10

## 2019-10-19 MED ORDER — FENTANYL CITRATE (PF) 100 MCG/2ML IJ SOLN
25.0000 ug | INTRAMUSCULAR | Status: DC | PRN
  Administered 2019-10-20 (×2): 25 ug via INTRAVENOUS
  Filled 2019-10-19: qty 2

## 2019-10-19 MED ORDER — FENTANYL CITRATE (PF) 100 MCG/2ML IJ SOLN
INTRAMUSCULAR | Status: AC
  Administered 2019-10-19: 100 ug
  Filled 2019-10-19: qty 2

## 2019-10-19 MED ORDER — FUROSEMIDE 10 MG/ML IJ SOLN
40.0000 mg | Freq: Once | INTRAMUSCULAR | Status: AC
  Administered 2019-10-19: 40 mg via INTRAVENOUS
  Filled 2019-10-19: qty 4

## 2019-10-19 MED ORDER — MIDAZOLAM HCL 2 MG/2ML IJ SOLN
2.0000 mg | Freq: Once | INTRAMUSCULAR | Status: DC
  Filled 2019-10-19: qty 2

## 2019-10-19 MED ORDER — SODIUM CHLORIDE 0.9 % IV BOLUS
500.0000 mL | Freq: Once | INTRAVENOUS | Status: AC
  Administered 2019-10-19: 500 mL via INTRAVENOUS

## 2019-10-19 MED ORDER — FENTANYL CITRATE (PF) 100 MCG/2ML IJ SOLN
25.0000 ug | INTRAMUSCULAR | Status: DC | PRN
  Administered 2019-10-19: 100 ug via INTRAVENOUS
  Administered 2019-10-20: 25 ug via INTRAVENOUS
  Administered 2019-10-20: 50 ug via INTRAVENOUS
  Administered 2019-10-20: 100 ug via INTRAVENOUS
  Administered 2019-10-20 – 2019-10-21 (×3): 50 ug via INTRAVENOUS
  Filled 2019-10-19 (×4): qty 2

## 2019-10-19 MED ORDER — MIDAZOLAM HCL 2 MG/2ML IJ SOLN
INTRAMUSCULAR | Status: AC
  Administered 2019-10-19: 2 mg via INTRAVENOUS
  Filled 2019-10-19: qty 2

## 2019-10-19 MED ORDER — ORAL CARE MOUTH RINSE
15.0000 mL | OROMUCOSAL | Status: DC
  Administered 2019-10-20 – 2019-10-26 (×67): 15 mL via OROMUCOSAL

## 2019-10-19 MED ORDER — DEXMEDETOMIDINE HCL IN NACL 400 MCG/100ML IV SOLN
0.0000 ug/kg/h | INTRAVENOUS | Status: DC
  Administered 2019-10-19: 0.2 ug/kg/h via INTRAVENOUS
  Administered 2019-10-20: 0.6 ug/kg/h via INTRAVENOUS
  Filled 2019-10-19 (×2): qty 100

## 2019-10-19 MED ORDER — MIDAZOLAM HCL 2 MG/2ML IJ SOLN
INTRAMUSCULAR | Status: AC
  Administered 2019-10-19: 2 mg
  Filled 2019-10-19: qty 4

## 2019-10-19 MED ORDER — CHLORHEXIDINE GLUCONATE 0.12% ORAL RINSE (MEDLINE KIT)
15.0000 mL | Freq: Two times a day (BID) | OROMUCOSAL | Status: DC
  Administered 2019-10-20 – 2019-10-26 (×14): 15 mL via OROMUCOSAL

## 2019-10-19 MED ORDER — LORAZEPAM 2 MG/ML IJ SOLN
1.0000 mg | Freq: Once | INTRAMUSCULAR | Status: AC
  Administered 2019-10-19: 1 mg via INTRAVENOUS
  Filled 2019-10-19: qty 1

## 2019-10-19 MED ORDER — FENTANYL CITRATE (PF) 100 MCG/2ML IJ SOLN: 100.0000 ug | Freq: Once | INTRAMUSCULAR | Status: AC

## 2019-10-19 MED ORDER — MIDAZOLAM HCL 2 MG/2ML IJ SOLN: 2.0000 mg | Freq: Once | INTRAMUSCULAR | Status: AC

## 2019-10-19 MED ORDER — MIDAZOLAM HCL 2 MG/2ML IJ SOLN
INTRAMUSCULAR | Status: AC
  Filled 2019-10-19: qty 2

## 2019-10-19 NOTE — Significant Event (Signed)
Rapid Response Event Note  Overview: Respiratory distress  Initial Focused Assessment: Notified by nursing staff regarding pts tachypnea and increased oxygen need. Miriam RT at bedside. Reportedly pt was tachypneic with RR 51 and oxygen saturations 89%. Pt placed on BIPAP per RT and on it currently. RR and WOB improving on BIPAP. Pt arousable to noxious stimuli. BBS Rhonchi. Skin is warm, pink and diaphoretic.  On Bipap, HR 120 ST, 148/101 (114), RR 32 with sats 98%. Pts WOB improving with BIPAP.   Interventions: -BIPAP per RT settings -PCXR ordered by MD  Plan of Care (if not transferred): -Notify primary svc of events and further orders -Keep [facility] greater than 45 degrees -Oral care per protocol -Notify primary svc and/or RRRN for further orders  Event Summary: Call received 0413 Arrived at call 0422 Call ended [place]   Madelynn Done

## 2019-10-19 NOTE — Plan of Care (Signed)
Called by RN-patient was in respiratory distress, called RRT who did deep suctioning with some improvement-now saturating at 97% whereas earlier was 88%. Continues to be a little tachycardic with a heart rate in the 120s-sinus rhythm.  Recommendations: -Obtain a stat chest x-ray -Continue with antibiotics -Continue with scheduled respiratory treatment -If the chest x-ray shows no evidence of fluid overload, will add some IV fluids bolus. -Check a.m. labs to include CBC and BMP.  -- Amie Portland, MD [facility]

## 2019-10-19 NOTE — Progress Notes (Signed)
h  NAME:  Riley Wagner, MRN:  [id number], DOB:  Aug 15, 1955, LOS: 80 ADMISSION DATE:  10/02/2019, CONSULTATION DATE:  10/02/2019 REFERRING MD:  Dr. Debbrah Alar, neuro IR, CHIEF COMPLAINT:  Altered mental status   Brief History   64 yo male former smoker brought to ER with hypertensive crisis (BP 212/110), Rt sided weakness and aphasia.  Found to have Lt terminal ICA occlusion.  Intubated for airway protection. Noted to have intraparenchymal bleed along posterior limb of IC as well as Rt sided bleed.  Had embolization of Lt ICA terminus.  Past Medical History  HTN, [facility] Events   5/16 Admit. To IR for embolization of Left ICA. Back to ICU intubated. BP goal 120-140 5/17: still hypertensive in spite of maximum dose Cleviprex.  Continues to exhibit right-sided hemiparesis 5/18: CT brain showing worsening midline shift and cerebral edema.  Still having difficulty with blood pressure control, requiring titration up of oral regimen.  Of note was made DO NOT RESUSCITATE by family on 5/17 5/20 bp under control. Moving all ext except RUE. Spoke w/ family; leaning towards attempt at extubation and trach if fails.  5/21 Extubated  5/28 Planning for placement of PEG today   Consults:  Neuro IR  Procedures:  ETT 5/16 >> 5/21 Radial aline 5/16 >>  Significant Diagnostic Tests:   CT head 5/16 >> acute cytotoxic edema in Lt insula and frontal operculum, extensive chronic small vessel ischemic changes  Echo 5/17 >> Left Ventricle: Left ventricular ejection fraction, by estimation, is 55 to 60%. The left ventricle has normal function. The left ventricle has no regional wall motion abnormalities. The left ventricular internal cavity size was normal in size. There is mild concentric left ventricular hypertrophy. Left ventricular diastolic parameters are consistent with Grade II diastolic dysfunction (pseudonormalization). Elevated left ventricular end-diastolic pressure.  CT brain 5/18:  small acute infarct in the right parietal cortex, no new hemorrhage, left MCA infarct with progressive cytotoxic edema/swelling in 6 mm midline shif  CT brain 5/21: 1. Left more than right cerebral infarcts without progression from brain MRI 2 days ago. Cytotoxic edema causes 1 cm of rightward shift.2. No interval hemorrhage.  CT brain 6/1: unchanged.   CXR 6/2: clear lung fields.  Micro Data:  SARS CoV2 PCR 5/16 >> negative MRSA PCR 5/16 > negative Respiratory culture 5/18: nml flora  Blood culture 5/22 >   Antimicrobials:  Unasyn 5/22 >   Interim history/subjective:   Increased respiratory distress overnight. Maintaining saturations on BiPAP  Objective   Blood pressure (!) 149/116, pulse (!) 109, temperature (!) 100.4 F (38 C), temperature source Axillary, resp. rate (!) 27, height 5\' 7"  (1.702 m), weight 75.3 kg, SpO2 99 %.    Vent Mode: PCV;BIPAP FiO2 (%):  [40 %-50 %] 40 % Set Rate:  [10 bmp] 10 bmp PEEP:  [8 cmH20] 8 cmH20 Pressure Support:  [8 cmH20] 8 cmH20   Intake/Output Summary (Last 24 hours) at 10/19/2019 1743 Last data filed at 10/19/2019 0000 Gross per 24 hour  Intake --  Output 1151 ml  Net -1151 ml   Filed Weights   10/15/19 0500 10/16/19 0500 10/17/19 0500  Weight: 75.3 kg 75.1 kg 75.3 kg    Examination: General: Chronically ill appearing elderly male lying in bed, in moderate distres HEENT: [place]/AT, MM pink/moist, PERRL, sclera non-icteric  Neuro: more somnolent, moves left side spontaneously but not to command.  Remains hemiplegic on the right. CV: s1s2 regular rate and rhythm, no murmur, rubs,  or gallops,  PULM:  Increased respiratory effort, ++ upper airway secretions.  GI: Abdominal binder in place following PEG tube insertion. Extremities: warm/dry, mild edema.  Edema  Skin: no rashes or lesions  Resolved Hospital Problem list   non-anion anion gap metabolic acidosis Acute hypoxic respiratory insufficiency with compromised airway. -Extubated  5/21 Hypertensive emergency Therapeutic hypernatremia, mild hyperchloremic   Assessment & Plan:   L MCA infarcts due to left M1 occlusion s/p unsuccessful L MCA thrombectomy  L MCA infarct likely secondary to L ICA large vessel disease.  Right MCA infarcts in setting of cocaine use and right ICA and MCA large vessel stenoses    Cerebral edema  -MRI brain showed evolving bilateral cerebral infarcts, large left MCA infarct, unchanged cytotoxic edema with 8 mm of rightward midline shift.  There is negative flow in the intracranial left ICA left MCA or left A1 segment -Prognosis for meaningful functional recovery poor  Increasing respiratory failure due to inability to protect airway.  Discussed with family. They would still like to proceed with tracheostomy. - Will transfer to ICU for semi-urgent intubation and plan tracheostomy tomorrow.   Aspiration pneumonia  -Complete course of antibiotics.   HX of hypertension  Hx of HLD. -Oral medications optimized by neurology P:  BP remains stable  Continue current regiment  Continue statin   CKD2. Serum creatinine stable P: Follow renal function / urine output Trend Bmet Avoid nephrotoxins, ensure adequate renal perfusion   Hyperglycemia P: Continue SSI     Best practice:  Diet: NPO-->start tubefeeds /17 DVT prophylaxis: SCDs & LMWH GI prophylaxis: protonix Mobility: bed rest Code Status: full code Disposition: ICU   Labs   CBC: Recent Labs  Lab 10/15/19 1913 10/16/19 0630 10/17/19 0937 10/18/19 1436 10/19/19 0511  WBC 20.1* 16.9* 16.8* 17.0* 22.0*  NEUTROABS  --   --   --  12.9*  --   HGB 13.1 12.4* 13.4 13.3 15.1  HCT 39.5 38.1* 41.3 41.1 45.6  MCV 93.6 94.1 95.2 93.6 93.4  PLT 494* 543* 636* 676* 688*    Basic Metabolic Panel: Recent Labs  Lab 10/15/19 1913 10/16/19 0630 10/17/19 0937 10/18/19 0705 10/19/19 0511  NA 149* 150* 148* 149* 146*  K 3.7 3.3* 3.4* 3.4* 4.9  CL 117* 116* 119* 118* 116*    CO2 20* 20* 18* 20* 18*  GLUCOSE 116* 146* 152* 141* 167*  BUN 45* 44* 40* 35* 35*  CREATININE 1.35* 1.39* 1.33* 1.36* 1.38*  CALCIUM 8.6* 8.6* 9.0 8.8* 9.1   GFR: Estimated Creatinine Clearance: 49.9 mL/min (A) (by C-G formula based on SCr of 1.38 mg/dL (H)). Recent Labs  Lab 10/16/19 0630 10/17/19 0937 10/18/19 1436 10/19/19 0511  WBC 16.9* 16.8* 17.0* 22.0*    Liver Function Tests: Recent Labs  Lab 10/17/19 0937 10/18/19 0705 10/19/19 0511  AST 128* 163* 169*  ALT 282* 327* 414*  ALKPHOS 106 112 135*  BILITOT 0.5 0.4 0.7  PROT 6.8 6.5 7.4  ALBUMIN 2.3* 2.1* 2.4*   No results for input(s): LIPASE, AMYLASE in the last 168 hours. No results for input(s): AMMONIA in the last 168 hours.  ABG    Component Value Date/Time   PHART 7.343 (L) 10/02/2019 1332   PCO2ART 44.4 10/02/2019 1332   PO2ART 58 (L) 10/02/2019 1332   HCO3 24.2 10/02/2019 1332   TCO2 26 10/02/2019 1332   ACIDBASEDEF 2.0 10/02/2019 1332   O2SAT 88.0 10/02/2019 1332     Coagulation Profile: No results for input(s):  INR, PROTIME in the last 168 hours.  Cardiac Enzymes: No results for input(s): CKTOTAL, CKMB, CKMBINDEX, TROPONINI in the last 168 hours.  HbA1C: Hgb A1c MFr Bld  Date/Time Value Ref Range Status  10/03/2019 02:15 AM 5.9 (H) 4.8 - 5.6 % Final    Comment:    (NOTE)         Prediabetes: 5.7 - 6.4         Diabetes: >6.4         Glycemic control for adults with diabetes: <7.0     CBG: Recent Labs  Lab 10/18/19 2021 10/18/19 2357 10/19/19 0446 10/19/19 1157 10/19/19 1503  GLUCAP 131* 176* 167* 179* 145*   CRITICAL CARE Performed by: Kipp Brood   Total critical care time: 40 minutes  Critical care time was exclusive of separately billable procedures and treating other patients.  Critical care was necessary to treat or prevent imminent or life-threatening deterioration.  Critical care was time spent personally by me on the following activities: development of  treatment plan with patient and/or surrogate as well as nursing, discussions with consultants, evaluation of patient's response to treatment, examination of patient, obtaining history from patient or surrogate, ordering and performing treatments and interventions, ordering and review of laboratory studies, ordering and review of radiographic studies, pulse oximetry, re-evaluation of patient's condition and participation in multidisciplinary rounds.  Kipp Brood, MD [facility] ICU Physician [place]  Pager: [phone] Mobile: [phone] After hours: [phone].    10/19/2019, 5:43 PM

## 2019-10-19 NOTE — Progress Notes (Signed)
PT Cancellation Note  Patient Details Name: [name] MRN: [id number] DOB: 01-06-1956   Cancelled Treatment:    Reason Eval/Treat Not Completed: Medical issues which prohibited therapy; patient with tachypnea, tachycardia overnight and possibly needing reintubation.  Will cancel tx for today.   Reginia Naas 10/19/2019, 9:59 AM  Magda Kiel, [place] [phone] 10/19/2019

## 2019-10-19 NOTE — Progress Notes (Signed)
STROKE TEAM PROGRESS NOTE   INTERVAL HISTORY RN is at the bedside. Pt lying in bed, lethargic, mildly agitated, on BiPAP. Overnight event noted, pt had acute respiratory distress with tachycardia and desat, had deep suctioning with some improvement but eventually needed BIPAP, CCM on board, concerning that pt needs to be re-intubated. Family in discussion now regarding re-intubation/trach vs. Comfort care.   Vitals:   10/19/19 1005 10/19/19 1100 10/19/19 1153 10/19/19 1155  BP: (!) 123/96 98/75 98/75    Pulse: (!) 121 (!) 119    Resp: (!) 34 (!) 35    Temp:    (!) 100.6 F (38.1 C)  TempSrc:    Axillary  SpO2: 100% 100% 100%   Weight:      Height:       CBC:  Recent Labs  Lab 10/18/19 1436 10/19/19 0511  WBC 17.0* 22.0*  NEUTROABS 12.9*  --   HGB 13.3 15.1  HCT 41.1 45.6  MCV 93.6 93.4  PLT 676* 123XX123*   Basic Metabolic Panel:  Recent Labs  Lab 10/18/19 0705 10/19/19 0511  NA 149* 146*  K 3.4* 4.9  CL 118* 116*  CO2 20* 18*  GLUCOSE 141* 167*  BUN 35* 35*  CREATININE 1.36* 1.38*  CALCIUM 8.8* 9.1   IMAGING past 24 hours  DG Chest 1 View  Result Date: 10/19/2019 CLINICAL DATA:  Respiratory distress EXAM: CHEST  1 VIEW COMPARISON:  October 18, 2019 FINDINGS: The heart size and mediastinal contours are within normal limits. There is a mildly hazy airspace opacity at the left lung base. The right lung is clear. No large airspace consolidation or pleural effusion. The visualized skeletal structures are unremarkable. IMPRESSION: Hazy airspace opacity at the left lung base which could be due to atelectasis and/or infectious etiology. Electronically Signed   By: Prudencio Pair M.D.   On: 10/19/2019 05:32   CT HEAD WO CONTRAST  Result Date: 10/18/2019 CLINICAL DATA:  Stroke, follow-up. EXAM: CT HEAD WITHOUT CONTRAST TECHNIQUE: Contiguous axial images were obtained from the base of the skull through the vertex without intravenous contrast. COMPARISON:  Noncontrast head CT 10/09/2019.  FINDINGS: Brain: There has been continued evolution of a large region of cytotoxic edema throughout the majority of the left MCA vascular territory and within the anteroinferior left frontal lobe involving the left ACA territory. There has been a continued interval decrease in conspicuity previously demonstrated petechial hemorrhage and/or contrast staining within the left basal ganglia. Redemonstrated patchy relative hyperdensity within the left MCA vascular territory infarct which may reflect spared cortex and/or petechial hemorrhage. Persistent although slightly decreased mass effect with now 8 mm rightward midline shift. Expected evolution of multiple additional known infarcts within the right cerebral hemisphere, the largest within the right parietal lobe. No new loss of gray-white differentiation is demonstrated. Stable background generalized parenchymal atrophy and chronic small vessel ischemic disease. No extra-axial fluid collection. No evidence of intracranial mass. Vascular: Redemonstrated streak artifact from embolization material in the region of the left ICA terminus. Skull: Normal. Negative for fracture or focal lesion. Sinuses/Orbits: Visualized orbits show no acute finding. Paranasal sinus disease. Most notably there is moderate mucosal thickening and frothy secretions within the right maxillary sinus. Frothy secretions are also present within the left sphenoid sinus. No significant mastoid effusion. IMPRESSION: Continued interval evolution of a large infarct involving the majority of the left MCA vascular territory and portions of the right ACA territory. Previously demonstrated petechial hemorrhage and/or contrast staining within the left basal ganglia has become  less conspicuous. Additional multifocal hyperdensity within the infarct territory which may reflect spared cortex or petechial hemorrhage. Persistent although decreased mass effect with now 8 mm rightward midline shift. Expected evolution  of multiple additional known infarcts in the right cerebral hemisphere as described. No interval intracranial abnormality is identified. Paranasal sinus disease as described. Correlate for acute sinusitis. Electronically Signed   By: Kellie Simmering DO   On: 10/18/2019 16:58    PHYSICAL EXAM   Temp:  [98.3 F (36.8 C)-103 F (39.4 C)] 100.6 F (38.1 C) (06/02 1155) Pulse Rate:  [86-138] 119 (06/02 1100) Resp:  [20-59] 35 (06/02 1100) BP: (98-156)/(75-124) 98/75 (06/02 1153) SpO2:  [89 %-100 %] 100 % (06/02 1153) FiO2 (%):  [40 %-50 %] 40 % (06/02 1153)  General - Well nourished, well developed, in acute respiratory distress, on BiPAP.  Ophthalmologic - fundi not visualized due to noncooperation.  Cardiovascular - Regular rhythm but tachycardia.  Neuro - awake with eyes open, but mildly agitated due to respiratory distress, increased WOB and RR, global aphasia, not following commands, left-sided gaze preference, does not cross the midline, right neglect, right hemianopia, right facial droop, pupil right 71mm left 5mm and reactive to light, tongue protrusion not cooperative, spontaneous and purposeful movement of left upper extremity less so in the left lower extremity, no movement of right upper and lower extremity with pain, starting to develop increased tone in that right upper extremity, toes are equivocal, Sensation, coordination and gait not tested.  ASSESSMENT/PLAN Riley Wagner is a 64 y.o. male with history of HTN who fell the night prior to admission, presenting with right sided weakness and aphasia. Taken to IR for terminus Left ICA occlusion.  Stroke:   L MCA infarcts due to left M1 occlusion s/p unsuccessful L MCA thrombectomy - L MCA infarct likely secondary to L ICA large vessel disease.  Stroke:   Right MCA infarcts in setting of cocaine use and right ICA and MCA large vessel stenoses     Code Stroke CT head cytotoxic edema L insula and frontal operculum. Likely old L  parietal cortical and subcortical abnormality. ASPECTS 8    CTA head & neck L ICA occlusion. Widespread atherosclerosis: B CCA to ICA bifurcation R 40%, L 50% w/ extensive soft plaques at bifurcation and bulbs, L 30-40%. Severe B ICA siphon stenoses. R MCA and B PCA atherosclerosis w/ narrowing & irregularity.  CT perfusion 105 penumbra L MCA territory.   Cerebral angio - 10/02/19 - [name], MD - diffuse and severe intracranial atherosclerosis. Proximal L M1 occlusion. Complete recanalization L M1 w/ embotrap w/ poor distal flow d/t severe L ICA stenosis s/p angioplasty. L ICA terminus coil embolization w/ sparing L anterior choroidal.  CT head repeat 5/16 large L basal ganglia hemorrhage/contrast posterior SDH and SAH. Large cytotoxic edema L MCA w/ 33mm midline shift.   CT head 5/17 diminishing SAH contrast. L MCA infarct w/o significant mass effect  CT head 5/18 diminishing SAH. L MCA infarct increased edema, MLS 28mm. Right parietal small new infarct.   MRI evolving B cerebral infarcts including large L MCA infarct w/ edema and 21mm R midline shift, right parietal and frontal infarcts. No significant hemorrhage.  MRA No flow L carotid systeme following thrombectomy. Advanced intracranial atherosclerosis including moderate R ICA, mild R M1, severe proximal R M2 and severe L V4 stenoses.    CT repeat 5/21 L>R infarct w/o progression. 1cm R midline shift. No hemorrhage   CT  repeat 5/23 - Continued interval evolution of left greater than right ischemic infarcts with unchanged 1cm of left-to-right shift.   CT repeat 6/1 - continued interval evolution of left large infarct, MLS at 5mm  2D Echo EF 55-60%   TG 471 and direct LDL 95.3, goal < 70  HgbA1c 5.9   UDS + cocaine and THC  lovenox 40 for VTE prophylaxis  No antithrombotic prior to admission, now on ASA 325mg  and plavix for B ICA severe stenoses. Continue DAPT x 3 months then aspirin alone.   Therapy  recommendations:  SNF   Disposition: pending   Code status - partial - DNI  Cerebral Edema, resolving  Present on admission scan  Repeat CT head 5/17 large left MCA infarct with edema but no significant midline shift  CT 5/18 repeat L MCA infarct increased edema, MLS 43mm. Right parietal small new infarct.  MRI 5/19 - large L MCA infarct with MLS 68mm  CT 5/21 - stable b/l infarct with MLS 1cm  CT 5/23 - unchanged 10 mm of left-to-right shift.   CT 6/1 - 56mm left to right MLS  Na 149   Acute hypoxic respiratory failure with compromised airway  Intubated for IR, continued d/t compromised airway  On propofol -> off  CXR 5/21 - increased left lung base consolidation/atelectasis. pulm vasc congestion  CXR 6/1 - Low volume chest with atelectasis or mild infiltrate at the left base.  Extubated 5/21  Still has difficulty with oral secretions  On CPT, neb, NT suction -> developed acute respiratory failure -> BiPAP -> CCM to consider re-intubation with trach -> family in discussion  Sepsis, septic shock Fever  Pneumonia, likely aspiration  Tmax 100.8-> 100.5->100.6  Leukocytosis - 16.8->17.0->22.0  UA - 10/18/19 neg  CXR 6/1 - Low volume chest with atelectasis or mild infiltrate at the left base.  CXR 6/2 - Hazy airspace opacity at the left lung base which could be due to atelectasis and/or infectious etiology.  Sputum culture 5/18 normal respiratory flora  Blood culture 5/29  NGTD   PICC out over weekend d/t temp. Has PIV  Tylenol / motrin prn   Unasyn - 5/22>>5/27   Cefepime 5/29>>  CCM on board now  Hypertensive Emergency Hypotension, septic shock  SBP > 220 on arrival  Home meds:  Amlodipine 10, coreg 3.125 bid, HCTZ 25, lisinopril 40, holding at this time due to hypotension, has labetalol prn  Off cleviprex now  On amlodipine 10, clonidine 0.1 tid, hydralazine 100 q8, labetalol 300 Q8, lisinopril 40, hygroton 25 - d/c'ed for significant hypotension  5/29   . BP stable 140s -> low now 90s . SBP goal < 180  . Long-term BP goal normotensive  Hyperlipidemia  Home meds:  lipitor 40, resumed in hospital  Direct LDL 95.3, goal < 70  On lipitor 40 - d/c'ed 10/18/19 due to elevated LFTs  Continue statin at discharge  Dysphagia . Secondary to stroke . NPO . PEG placement 5/28 -> on tube feedings  . on free water 200 q6h  . Speech on board  Elevated LFTs  AST/ALT -128/282 -> 163/327->169/414  Hepatitis panel neg  Likely related to infection and sepsis  Statin discontinued for now  Cocaine abuse  UDS positive for cocaine  Cessation education will be provided   Other Stroke Risk Factors  Advanced age  Former Cigarette smoker, quit 5 yrs ago  ETOH use, advised to drink no more than 2 drink(s) a day  THC abuse - UDS:  THC POSITIVE, cessation education will be provided.  Family hx stroke (mother)  Coronary artery disease, MI  Other Active Problems  CKD stage II Cre 1.39->1.33->1.36->1.38  Left eye conjunctivitis and subconjunctival hemorrhage - Ciprofloxacin drops started 5/21->completed and improved  Constipation - Dulcolax sppositories  Hyperglycemia -> SSI  Lethargy. Amantadine 100 bid added 10/13/19  Hypokalemia - 3.4 ->4.9  Hospital day # 17   This patient is critically ill due to worsening respiratory status, respiratory failure needing BIPAP and likely re-intubation, sepsis, spetic shock, leukocytosis, hypotension and at significant risk of neurological worsening, death form sepsis, heart failure, respiratory failure. This patient's care requires constant monitoring of vital signs, hemodynamics, respiratory and cardiac monitoring, review of multiple databases, neurological assessment, discussion with family, other specialists and medical decision making of high complexity. I spent 40 minutes of neurocritical care time in the care of this patient. I have discussed with CCM Dr. Lynetta Mare. I also talked with  son Legrand Como over the phone, family is coming to hospital for further decision.    Rosalin Hawking, MD PhD Stroke Neurology 10/19/2019 12:28 PM   To contact Stroke Continuity provider, please refer to [email or web address] After hours, contact General Neurology

## 2019-10-19 NOTE — Progress Notes (Signed)
Report given by Jacklynn Barnacle, RN to Raquel Sarna, [place] [facility] ,  Waiting transfer.

## 2019-10-19 NOTE — Progress Notes (Signed)
RT holding CPT at this time due to patients work of breathing and increased BP.

## 2019-10-19 NOTE — Procedures (Signed)
Intubation Procedure Note Riley Wagner [id number] April 29, 1956  Procedure: Intubation Indications: Respiratory insufficiency  Procedure Details Consent: Unable to obtain consent because of altered level of consciousness. Time Out: Verified patient identification, verified procedure, site/side was marked, verified correct patient position, special equipment/implants available, medications/allergies/relevent history reviewed, required imaging and test results available.  Performed  Maximum sterile technique was used including antiseptics, cap, gloves, hand hygiene and mask.  MAC and 3 ETT 7.5 cuffed  + Color change, bilateral breath sounds  Evaluation Hemodynamic Status: BP stable throughout; O2 sats: stable throughout Patient's Current Condition: stable Complications: No apparent complications Patient did tolerate procedure well. Chest X-ray ordered to verify placement.  CXR: pending.   Riley Wagner 10/19/2019

## 2019-10-19 NOTE — Progress Notes (Signed)
0400: Patient having trouble clearing secretions. HR 134, RR 59, and O2 sat 88%. RRT notified and came to room to suction patient. Rapid RN notified as well. RRT placed pt on Bi-pap. HR now 120, RR 35, and O2 97.  0440: MD on call notified 0443: MD aware and new orders received.

## 2019-10-19 NOTE — Progress Notes (Signed)
Called for continued tachypnea and the patient appears to be in some distress, which is felt to be partially secretion-related per RT and RN. CXR did not show volume overload but did reveal   DDx: -- Tachypnea and agitation due to possible EtOH withdrawal -- Deep mucus plug -- Worsening PNA -- Central neurogenic hyperventilation -- PE is not strongly suspected  A/R:  -- Administer IV Ativan 1 mg x 1 for possible EtOH withdrawal -- Calling CCM for consult as it is felt that the patient's respiratory function is declining and he may need to be transferred to the ICU if an etiology leading to effective treatment of his tachypnea is not elucidated.   Electronically signed: Dr. Kerney Elbe

## 2019-10-19 NOTE — Significant Event (Signed)
Rapid Response Event Note  Overview: Follow Up - Respiratory and Neurologic  Initial Focused Assessment: I came by to see Riley Wagner as follow up. Upon arrival, he was quite somnolent, but he was spontaneously moving his LUE - otherwise not moving any other extremity to command or pain. Per nurse, he received Ativan 1 mg for EtOH withdrawal symptoms per Neuro MD at 0815 . He is tachypneic on BIPAP- RR 34-38 - mild work of breathing present as well. Lung sounds - diminished in the bases L >R. Skin warm to touch, looks hydrated overall. RN did administer some Lasix IV as well earlier per PCCM MD. I tried multiple times to assess his pupils, patient was not cooperative with this exam but from what I could see - they look the same in size. I did not check for a gag reflex given that he is on BIPAP and his breathing is still fairly labored.   VS: 125/90(101), HR 117, RR 35, 100% on BIPAP 40% 16/8  Interventions: -- PIV 20G RLFA - GBR - placed   Plan of Care: -- High risk for aspiration and intubation. I recommend that the patient's family be contacted and a discussion about GOCs be initiated.  -- Patient's overall condition is tenuous - high risk for intubation -- Monitor VS, neurologic status, and respiratory status -- Aspiration Precautions - Keep HOB > 30, suction at bedside -- Delirium Precautions  -- Perhaps Palliative Consult Medicine -- Rest per medical team   Event Summary:  Start Time 0955 End Time [place], [place]

## 2019-10-20 LAB — CBC
HCT: 38.4 % — ABNORMAL LOW (ref 39.0–52.0)
Hemoglobin: 12.3 g/dL — ABNORMAL LOW (ref 13.0–17.0)
MCH: 30.4 pg (ref 26.0–34.0)
MCHC: 32 g/dL (ref 30.0–36.0)
MCV: 94.8 fL (ref 80.0–100.0)
Platelets: 632 10*3/uL — ABNORMAL HIGH (ref 150–400)
RBC: 4.05 MIL/uL — ABNORMAL LOW (ref 4.22–5.81)
RDW: 15.6 % — ABNORMAL HIGH (ref 11.5–15.5)
WBC: 19.6 10*3/uL — ABNORMAL HIGH (ref 4.0–10.5)
nRBC: 0 % (ref 0.0–0.2)

## 2019-10-20 LAB — POCT I-STAT 7, (LYTES, BLD GAS, ICA,H+H)
Acid-base deficit: 3 mmol/L — ABNORMAL HIGH (ref 0.0–2.0)
Bicarbonate: 21.8 mmol/L (ref 20.0–28.0)
Calcium, Ion: 1.25 mmol/L (ref 1.15–1.40)
HCT: 41 % (ref 39.0–52.0)
Hemoglobin: 13.9 g/dL (ref 13.0–17.0)
O2 Saturation: 100 %
Patient temperature: 100.8
Potassium: 3.9 mmol/L (ref 3.5–5.1)
Sodium: 151 mmol/L — ABNORMAL HIGH (ref 135–145)
TCO2: 23 mmol/L (ref 22–32)
pCO2 arterial: 41.3 mmHg (ref 32.0–48.0)
pH, Arterial: 7.336 — ABNORMAL LOW (ref 7.350–7.450)
pO2, Arterial: 281 mmHg — ABNORMAL HIGH (ref 83.0–108.0)

## 2019-10-20 LAB — GLUCOSE, CAPILLARY
Glucose-Capillary: 211 mg/dL — ABNORMAL HIGH (ref 70–99)
Glucose-Capillary: 159 mg/dL — ABNORMAL HIGH (ref 70–99)
Glucose-Capillary: 108 mg/dL — ABNORMAL HIGH (ref 70–99)
Glucose-Capillary: 106 mg/dL — ABNORMAL HIGH (ref 70–99)
Glucose-Capillary: 136 mg/dL — ABNORMAL HIGH (ref 70–99)
Glucose-Capillary: 141 mg/dL — ABNORMAL HIGH (ref 70–99)

## 2019-10-20 LAB — BASIC METABOLIC PANEL
Anion gap: 11 (ref 5–15)
BUN: 70 mg/dL — ABNORMAL HIGH (ref 8–23)
CO2: 18 mmol/L — ABNORMAL LOW (ref 22–32)
Calcium: 8.5 mg/dL — ABNORMAL LOW (ref 8.9–10.3)
Chloride: 121 mmol/L — ABNORMAL HIGH (ref 98–111)
Creatinine, Ser: 2.65 mg/dL — ABNORMAL HIGH (ref 0.61–1.24)
GFR calc Af Amer: 28 mL/min — ABNORMAL LOW (ref >60)
GFR calc non Af Amer: 24 mL/min — ABNORMAL LOW (ref >60)
Glucose, Bld: 204 mg/dL — ABNORMAL HIGH (ref 70–99)
Potassium: 4.3 mmol/L (ref 3.5–5.1)
Sodium: 150 mmol/L — ABNORMAL HIGH (ref 135–145)

## 2019-10-20 LAB — CULTURE, BLOOD (ROUTINE X 2)
Culture: NO GROWTH
Culture: NO GROWTH
Special Requests: ADEQUATE

## 2019-10-20 LAB — PROTIME-INR
INR: 1.4 — ABNORMAL HIGH (ref 0.8–1.2)
Prothrombin Time: 16.8 seconds — ABNORMAL HIGH (ref 11.4–15.2)

## 2019-10-20 MED ORDER — MIDAZOLAM HCL 2 MG/2ML IJ SOLN
5.0000 mg | Freq: Once | INTRAMUSCULAR | Status: AC
  Administered 2019-10-20: 4 mg via INTRAVENOUS
  Filled 2019-10-20: qty 6

## 2019-10-20 MED ORDER — SODIUM CHLORIDE 0.9 % IV SOLN
2.0000 g | INTRAVENOUS | Status: AC
  Administered 2019-10-21: 2 g via INTRAVENOUS
  Filled 2019-10-20: qty 2

## 2019-10-20 MED ORDER — SODIUM CHLORIDE 0.9 % IV SOLN: 250.0000 mL | INTRAVENOUS | Status: DC

## 2019-10-20 MED ORDER — VECURONIUM BROMIDE 10 MG IV SOLR
10.0000 mg | Freq: Once | INTRAVENOUS | Status: AC
  Administered 2019-10-20: 10 mg via INTRAVENOUS
  Filled 2019-10-20: qty 10

## 2019-10-20 MED ORDER — PSYLLIUM 95 % PO PACK: 1.0000 | PACK | Freq: Two times a day (BID) | ORAL | Status: DC

## 2019-10-20 MED ORDER — ENOXAPARIN SODIUM 30 MG/0.3ML ~~LOC~~ SOLN: 30.0000 mg | SUBCUTANEOUS | Status: DC

## 2019-10-20 MED ORDER — LACTATED RINGERS IV BOLUS
500.0000 mL | Freq: Once | INTRAVENOUS | Status: AC
  Administered 2019-10-20: 500 mL via INTRAVENOUS

## 2019-10-20 MED ORDER — FENTANYL CITRATE (PF) 100 MCG/2ML IJ SOLN: 100.0000 ug | Freq: Once | INTRAMUSCULAR | Status: DC

## 2019-10-20 MED ORDER — PHENYLEPHRINE 40 MCG/ML (10ML) SYRINGE FOR IV PUSH (FOR BLOOD PRESSURE SUPPORT)
80.0000 ug | PREFILLED_SYRINGE | Freq: Once | INTRAVENOUS | Status: AC | PRN
  Administered 2019-10-20: 80 ug via INTRAVENOUS

## 2019-10-20 MED ORDER — PROPOFOL 10 MG/ML IV BOLUS
0.5000 mg/kg | Freq: Once | INTRAVENOUS | Status: AC
  Administered 2019-10-20: 36.9 mg via INTRAVENOUS

## 2019-10-20 MED ORDER — PHENYLEPHRINE HCL-NACL 10-0.9 MG/250ML-% IV SOLN: 25.0000 ug/min | INTRAVENOUS | Status: DC

## 2019-10-20 MED ORDER — OSMOLITE 1.5 CAL PO LIQD
1000.0000 mL | ORAL | Status: DC
  Administered 2019-10-21 – 2019-10-25 (×5): 1000 mL
  Filled 2019-10-20 (×9): qty 1000

## 2019-10-20 MED ORDER — LACTATED RINGERS IV SOLN: INTRAVENOUS | Status: DC

## 2019-10-20 MED ORDER — PHENYLEPHRINE 40 MCG/ML (10ML) SYRINGE FOR IV PUSH (FOR BLOOD PRESSURE SUPPORT)
PREFILLED_SYRINGE | INTRAVENOUS | Status: AC
  Filled 2019-10-20: qty 10

## 2019-10-20 MED ORDER — FENTANYL CITRATE (PF) 100 MCG/2ML IJ SOLN
200.0000 ug | Freq: Once | INTRAMUSCULAR | Status: AC
  Administered 2019-10-20: 200 ug via INTRAVENOUS
  Filled 2019-10-20: qty 4

## 2019-10-20 MED ORDER — OXYCODONE HCL 5 MG/5ML PO SOLN: 5.0000 mg | ORAL | Status: DC | PRN

## 2019-10-20 NOTE — Progress Notes (Signed)
PT Cancellation Note  Patient Details Name: Riley Wagner MRN: [id number] DOB: [birth year]   Cancelled Treatment:    Reason Eval/Treat Not Completed: Medical issues which prohibited therapy; patient intubated overnight and for planned tracheostomy this pm.  Will check back another day.    Reginia Naas 10/20/2019, 10:26 AM Magda Kiel, [place] [phone] 10/20/2019

## 2019-10-20 NOTE — Progress Notes (Signed)
Pharmacy Antibiotic Note  Riley Wagner is a 64 y.o. male admitted on 10/02/2019 with new CVA and now with increased WBC/fevers on 5/29 concerning for sepsis. Pharmacy consulted for Cefepime dosing.    The patient has noted to be in AKI this morning with SCr bump up to 2.65 from 1.38, estimated CrCl<30 ml/min. Will reduce the Cefepime dose and follow-up with LOT plans. Today is D#6 of therapy.  Plan: - Reduce Cefepime to 2g IV every 24 hours - Will continue to follow renal function, culture results, LOT, and antibiotic de-escalation plans   Height: 5\' 7"  (170.2 cm) Weight: 73.7 kg (162 lb 7.7 oz) IBW/kg (Calculated) : 66.1  Temp (24hrs), Avg:100 F (37.8 C), Min:98.6 F (37 C), Max:101.6 F (38.7 C)  Recent Labs  Lab 10/16/19 0630 10/17/19 0937 10/18/19 0705 10/18/19 1436 10/19/19 0511 10/20/19 0638  WBC 16.9* 16.8*  --  17.0* 22.0* 19.6*  CREATININE 1.39* 1.33* 1.36*  --  1.38* 2.65*    Estimated Creatinine Clearance: 26 mL/min (A) (by C-G formula based on SCr of 2.65 mg/dL (H)).    No Known Allergies  Cefepime 5/29>> Vanc 5/29>>5/31 Unasyn 5/22 >>5/29 Cipro eye drops 5/21 >> 5/26  5/18 TA: GPC - normal flora 5/16 MRSA PCR: neg 5/16 COVID: neg 5/22 BCx >> neg 5/29 Resp Cx - rare GNR >> NOF 5/29 Bld - ngx2d  Thank you for allowing pharmacy to be a part of this patient's care.  Alycia Rossetti, PharmD, BCPS Clinical Pharmacist Clinical phone for 10/20/2019: [phone] 10/20/2019 10:34 AM   **Pharmacist phone directory can now be found on [email or web address] ([email or web address]).  Listed under [place].

## 2019-10-20 NOTE — Progress Notes (Signed)
eLink Physician-Brief Progress Note Patient Name: Riley Wagner DOB: March 03, 1956 MRN: [id number]   Date of Service  10/20/2019  HPI/Events of Note  Agitation - Fentanyl IV PRN drops BP.   eICU Interventions  Plan: 1. Titrate Precedex IV infusion to control agitation. 2. Avoid using Fentanyl. 3. Increase ceiling on Precedex IV infusion to 1.6 mcg/kg/hour.      Intervention Category Major Interventions: Delirium, psychosis, severe agitation - evaluation and management  Jarrette Dehner [place] 10/20/2019, 4:50 AM

## 2019-10-20 NOTE — Progress Notes (Signed)
Difficulties with balancing blood pressure and sedation. Becomes tachypneic to the 30s with forceful coughing & tube biting with stimulation on precedex alone, however blood pressure very sensitive to fentanyl, becoming hypotensive to the 70s after low dose administered. Concerns addressed with Elink RN.

## 2019-10-20 NOTE — Progress Notes (Signed)
STROKE TEAM PROGRESS NOTE   INTERVAL HISTORY No family at bedside. Pt was re-intubated yesterday and plan to have trach this pm.  Currently pt eyes open, comfortably with ET tube, on low dose precedex. Soft BP at 90-100s. Off TF for procedure, on IVF and cefepime. Afebrile today. Less secretions today than yesterday.   Vitals:   10/20/19 0845 10/20/19 0851 10/20/19 0900 10/20/19 0930  BP: 117/69  (!) 80/58 100/67  Pulse: (!) 101  95 (!) 47  Resp: 20  (!) 21 19  Temp:  99.4 F (37.4 C)    TempSrc:  Axillary    SpO2: 100%  99% 97%  Weight:      Height:       CBC:  Recent Labs  Lab 10/18/19 1436 10/18/19 1436 10/19/19 0511 10/19/19 0511 10/20/19 0004 10/20/19 0638  WBC 17.0*   < > 22.0*  --   --  19.6*  NEUTROABS 12.9*  --   --   --   --   --   HGB 13.3   < > 15.1   < > 13.9 12.3*  HCT 41.1   < > 45.6   < > 41.0 38.4*  MCV 93.6   < > 93.4  --   --  94.8  PLT 676*   < > 688*  --   --  632*   < > = values in this interval not displayed.   Basic Metabolic Panel:  Recent Labs  Lab 10/19/19 0511 10/19/19 0511 10/20/19 0004 10/20/19 0638  NA 146*   < > 151* 150*  K 4.9   < > 3.9 4.3  CL 116*  --   --  121*  CO2 18*  --   --  18*  GLUCOSE 167*  --   --  204*  BUN 35*  --   --  70*  CREATININE 1.38*  --   --  2.65*  CALCIUM 9.1  --   --  8.5*   < > = values in this interval not displayed.   IMAGING past 24 hours  DG CHEST PORT 1 VIEW  Result Date: 10/19/2019 CLINICAL DATA:  Status post intubation EXAM: PORTABLE CHEST 1 VIEW COMPARISON:  10/19/2019 FINDINGS: Endotracheal tube is now seen 2.8 cm above the carina. Cardiac shadow is within limits. Increasing left basilar atelectasis is noted. Lungs are otherwise clear. No bony abnormality is noted. IMPRESSION: Increasing left basilar atelectasis/infiltrate. Endotracheal tube as described. Electronically Signed   By: Inez Catalina M.D.   On: 10/19/2019 22:12    PHYSICAL EXAM   Temp:  [98.5 F (36.9 C)-101.6 F (38.7 C)]  99.4 F (37.4 C) (06/03 1600) Pulse Rate:  [47-125] 93 (06/03 1830) Resp:  [13-33] 17 (06/03 1830) BP: (66-199)/(44-115) 101/73 (06/03 1830) SpO2:  [97 %-100 %] 100 % (06/03 1830) FiO2 (%):  [40 %-100 %] 40 % (06/03 1458) Weight:  [73.7 kg-74.5 kg] 73.7 kg (06/03 0500)  General - Well nourished, well developed, intubated on precedex  Ophthalmologic - fundi not visualized due to noncooperation.  Cardiovascular - Regular rate and rhythm.  Neuro - intubated on precedex, eyes open, not following commands. Eyes in left gaze position, not blinking to visual threat on the right, doll's eyes present, not tracking in right visual field but tracking on the left, PERRL. Corneal reflex present, gag and cough present. Breathing over the vent.  Facial symmetry not able to test due to ET tube.  Tongue protrusion not cooperative. On pain stimulation,  LUE at least 3/5 and LLE 2+/5, but no movement on the RUE and slight withdraw at RLE, increased muscle tone on the right. Sensation, coordination and gait not tested.     ASSESSMENT/PLAN Mr. Rumaldo Opfer is a 64 y.o. male with history of HTN who fell the night prior to admission, presenting with right sided weakness and aphasia. Taken to IR for terminus Left ICA occlusion.  Stroke:   L MCA infarcts due to left M1 occlusion s/p unsuccessful L MCA thrombectomy - L MCA infarct likely secondary to L ICA large vessel disease.  Stroke:   Right MCA infarcts in setting of cocaine use and right ICA and MCA large vessel stenoses     Code Stroke CT head cytotoxic edema L insula and frontal operculum. Likely old L parietal cortical and subcortical abnormality. ASPECTS 8    CTA head & neck L ICA occlusion. Widespread atherosclerosis: B CCA to ICA bifurcation R 40%, L 50% w/ extensive soft plaques at bifurcation and bulbs, L 30-40%. Severe B ICA siphon stenoses. R MCA and B PCA atherosclerosis w/ narrowing & irregularity.  CT perfusion 105 penumbra L MCA territory.    Cerebral angio - 10/02/19 - [name], MD - diffuse and severe intracranial atherosclerosis. Proximal L M1 occlusion. Complete recanalization L M1 w/ embotrap w/ poor distal flow d/t severe L ICA stenosis s/p angioplasty. L ICA terminus coil embolization w/ sparing L anterior choroidal.  CT head repeat 5/16 large L basal ganglia hemorrhage/contrast posterior SDH and SAH. Large cytotoxic edema L MCA w/ 97mm midline shift.   CT head 5/17 diminishing SAH contrast. L MCA infarct w/o significant mass effect  CT head 5/18 diminishing SAH. L MCA infarct increased edema, MLS 62mm. Right parietal small new infarct.   MRI evolving B cerebral infarcts including large L MCA infarct w/ edema and 76mm R midline shift, right parietal and frontal infarcts. No significant hemorrhage.  MRA No flow L carotid systeme following thrombectomy. Advanced intracranial atherosclerosis including moderate R ICA, mild R M1, severe proximal R M2 and severe L V4 stenoses.    CT repeat 5/21 L>R infarct w/o progression. 1cm R midline shift. No hemorrhage   CT repeat 5/23 - Continued interval evolution of left greater than right ischemic infarcts with unchanged 1cm of left-to-right shift.   CT repeat 6/1 - continued interval evolution of left large infarct, MLS at 23mm  2D Echo EF 55-60%   TG 471 and direct LDL 95.3, goal < 70  HgbA1c 5.9   UDS + cocaine and THC  lovenox 40 for VTE prophylaxis  No antithrombotic prior to admission, now on ASA 325mg  and plavix for B ICA severe stenoses. Continue DAPT x 3 months then aspirin alone.   Therapy recommendations:  SNF   Disposition: pending   Cerebral Edema, resolving  Present on admission scan  Repeat CT head 5/17 large left MCA infarct with edema but no significant midline shift  CT 5/18 repeat L MCA infarct increased edema, MLS 25mm. Right parietal small new infarct.  MRI 5/19 - large L MCA infarct with MLS 20mm  CT 5/21 - stable b/l infarct  with MLS 1cm  CT 5/23 - unchanged 10 mm of left-to-right shift.   CT 6/1 - 60mm left to right MLS  Na 149-146-151-150  Acute hypoxic respiratory failure with compromised airway  Intubated for IR, continued d/t compromised airway  CXR 5/21 - increased left lung base consolidation/atelectasis. pulm vasc congestion  CXR 6/1 - Low volume  chest with atelectasis or mild infiltrate at the left base.  Extubated 5/21  Still has difficulty with oral secretions  On CPT, neb, NT suction -> developed acute respiratory failure -> BiPAP -> CCM discussed re-intubation with trach   Reintubated 6/2. transferred back to ICU  On precedex now   Trach 6/3  Sepsis, septic shock Fever  Pneumonia, likely aspiration  Tmax 100.8->100.5->100.6->101.6->afebrile  Leukocytosis - 16.8->17.0->22.0->19.6  UA - 10/18/19 neg  CXR 6/1 - Low volume chest with atelectasis or mild infiltrate at the left base.  CXR 6/2 - Hazy airspace opacity at the left lung base which could be due to atelectasis and/or infectious etiology.  Sputum culture 5/18 normal respiratory flora  PICC out d/t temp. Has PIV  Blood culture 5/29  NGTD   Resp Cx 5/29 - rare GN - NOF  Tylenol / motrin prn   Unasyn - 5/22>>5/29  Cefepime 5/29>>  VANC 5/29>>5/31  CCM on board now  Hypertensive Emergency Hypotension, septic shock and medication effect  SBP > 220 on arrival  Home meds:  Amlodipine 10, coreg 3.125 bid, HCTZ 25, lisinopril 40, holding at this time due to hypotension, has labetalol prn  Off cleviprex now  On amlodipine 10, clonidine 0.1 tid, hydralazine 100 q8, labetalol 300 Q8, lisinopril 40, hygroton 25 - d/c'ed for significant hypotension 5/29    BP stable 140s -> low now 90s  SBP goal < 180   Long-term BP goal normotensive  Hyperlipidemia  Home meds:  lipitor 40, resumed in hospital  Direct LDL 95.3, goal < 70  On lipitor 40 - d/c'ed 10/18/19 due to elevated LFTs  Consider statin at  discharge  Dysphagia  Secondary to stroke  NPO  PEG placement 5/28 -> on tube feedings   on free water 200 q6h   Speech on board  CKD stage III  Cre 1.39->1.33->1.36->1.38->2.65  Likely due to hypotension and sepsis  CCM on board  Close monitoring  Elevated LFTs  AST/ALT -128/282 -> 163/327->169/414  Hepatitis panel neg  Likely related to infection and sepsis  Statin discontinued for now  Cocaine abuse  UDS positive for cocaine  Cessation education will be provided   Other Stroke Risk Factors  Advanced age  Former Cigarette smoker, quit 5 yrs ago  ETOH use, advised to drink no more than 2 drink(s) a day  THC abuse - UDS:  THC POSITIVE, cessation education will be provided.  Family hx stroke (mother)  Coronary artery disease, MI  Other Active Problems  Left eye conjunctivitis and subconjunctival hemorrhage - Ciprofloxacin drops started 5/21>>5/26 - improved  Constipation - Dulcolax sppositories  Hyperglycemia -> SSI  Lethargy. Amantadine 100 bid added 10/13/19  Hypokalemia - 3.4 ->4.9->4.3 - resolved  Hospital day # 18   Patient continues to be critically ill for the last 24 hours, has developed respiratory failure needed to be re-intubated, also developed hypotension could be due to sepsis vs. Medication effect, continues to have soft BP and fever overnight and leukocytosis, and I discussed with Dr. Lynetta Mare.   This patient is critically ill due to worsening respiratory status, respiratory failure needing BIPAP and likely re-intubation, sepsis, spetic shock, leukocytosis, hypotension and at significant risk of neurological worsening, death form sepsis, heart failure, respiratory failure. This patient's care requires constant monitoring of vital signs, hemodynamics, respiratory and cardiac monitoring, review of multiple databases, neurological assessment, discussion with family, other specialists and medical decision making of high complexity. I  spent 40 minutes of neurocritical care time in the  care of this patient.   Rosalin Hawking, MD PhD Stroke Neurology 10/20/2019 10:39 AM   To contact Stroke Continuity provider, please refer to [email or web address] After hours, contact General Neurology

## 2019-10-20 NOTE — Progress Notes (Signed)
Nutrition Follow-up  DOCUMENTATION CODES:   Not applicable  INTERVENTION:   Tube feeding via PEG: Increase Osmolite 1.5 to 55 ml/h (1320 ml per day) Pro-stat 30 ml BID MVI daily  Provides 2180 kcal, 112 gm protein, 1005 ml free water daily  200 ml free water every 6 hours Total free water 1805 ml   NUTRITION DIAGNOSIS:   Inadequate oral intake related to inability to eat as evidenced by NPO status. Ongoing.   GOAL:   Patient will meet greater than or equal to 90% of their needs Meeting with TF  MONITOR:   TF tolerance, Labs  REASON FOR ASSESSMENT:   Consult, Ventilator Enteral/tube feeding initiation and management  ASSESSMENT:   Pt with PMH of HTN, HLD, and former smoker now admitted with L MCA infarct due to hypertensive crisis. Pt s/p emergent thrombectomy of L MCA M1 occlusion, balloon angioplasty of R ICA stenosis followed by coil embolization of R ICA secondary to persistent bleeding.    5/21 extubated; cortrak tube placed  5/28 s/p PEG 6/2 respiratory distress; re-intubated 6/3 plan for trach  Patient is currently intubated on ventilator support MV: 16.9 L/min Temp (24hrs), Avg:100 F (37.8 C), Min:98.6 F (37 C), Max:101.6 F (38.7 C)   Medications reviewed and include: colace, MVI LR @ 75 ml/hr  Precedex  Labs reviewed: Na 150 (H)    TF: Osmolite 1.5 at 50 ml/h with Pro-stat 30 ml BID Provides 2000 kcal, 105 gm protein, 912 ml free water daily  Diet Order:   Diet Order            Diet NPO time specified  Diet effective midnight        Diet NPO time specified  Diet effective midnight              EDUCATION NEEDS:   No education needs have been identified at this time  Skin:  Skin Assessment: Reviewed RN Assessment  Last BM:  6/2 large; type 7 via rectal pouch  Height:   Ht Readings from Last 1 Encounters:  10/02/19 5\' 7"  (1.702 m)    Weight:   Wt Readings from Last 1 Encounters:  10/20/19 73.7 kg    Ideal Body  Weight:  67.2 kg  BMI:  Body mass index is 25.45 kg/m.  Estimated Nutritional Needs:   Kcal:  2200  Protein:  100-120 grams  Fluid:  > 1.9 L/day  Lockie Pares., RD, LDN, CNSC See [facility] for contact information

## 2019-10-20 NOTE — Progress Notes (Signed)
100 mcg of Fentanyl administered for forceful coughing and ventilator dyssynchrony. Afterwards pt hypotensive to the 70s, sedation stopped. Ventilator dyssynchrony resumed once sedation held; BP improved to MAP > 65 so Precedex restarted at 0.66mcg, 39mcg of Fentanyl administered for dyssynchrony.   Extremely diaphoretic. Fever down from 101.6 to 42F following tylenol and ibuprofen administration.   Elink aware.

## 2019-10-20 NOTE — Procedures (Addendum)
Procedure: Percutaneous Tracheostomy CPT 31600 Performed by: Salvadore Dom ACNP Bronchoscopy Assistant: Dr Lynetta Mare .  Indications: Chronic respiratory failure and need for ongoing mechanical ventilation.  Consent: Given patient's intubation and sedation, the patient was unable to provide consent. Discussed the procedure with the patient's decision maker, including the indications, risks, benefits, and alternatives. All questions were answered. Verbal witnessed consent was obtained and placed in the chart.  Preprocedure: Universal protocol was followed for this procedure. Prior to the initiation of sedation or the procedure, a timeout/"Pause for the Cause" was performed. The patient's identity was verified by confirming the patient's wrist band for name, date of birth, and medical record number. Everyone in the room was in agreement with the patient identify, the procedure to be performed, consent was in place and matched the planned procedure, and the procedure site. The area was cleaned with a CHG scrub and draped with large sterile barrier. Hand hygiene was performed, and cap, mask, sterile gown, and sterile gloves were worn. The patient was covered by a large sterile drape. Sterile technique was maintained for the entire procedure.  Anesthesia: The patient was intubated and sedated prior to the procedure. Additional midazolam, etomidate, fentanyl and rocuronium were given for sedation and paralysis with close attention to vital signs throughout procedure. Please refer to the accompanying procedural sedation form for additional details. Once the patient was adequately sedated.  Procedure: The patient was placed in the supine position. The anterior neck was prepped and draped in usual sterile fashion. 1% lidocaine was administered approximately 2 fingerbreadths above the sternal notch for local anesthesia. A 1.5-cm vertical incision was then performed 2 fingerbreadths above the sternal notch. Using a  curved Kelly, blunt dissection was performed down to the level of the pretracheal fascia. At this point, the bronchoscope was introduced through the endotracheal tube and the trachea was properly visualized. The endotracheal tube was then gradually withdrawn within the trachea under direct bronchoscopic visualization. Proper midline position was confirmed by bouncing the needle from the tracheostomy tray over the trachea with bronchoscopic examination. The needle was advanced into the trachea and proper positioning was confirmed with direct visualization. The needle was then removed leaving a white outer cannula in position. The wire from the tracheostomy tray was then advanced through the white outer cannula. The cannula was then removed. The small, blue dilator was then advanced over the wire into the trachea. Once proper dilatation was achieved, the dilator was removed. The large, tapered dilator was then advanced over the wire into the trachea. The dilator was removed leaving the wire and white inner cannula in position. A number 6 percutaneous Shiley tracheostomy tube was then advanced over the wire and white inner cannula into the trachea. Proper positioning was confirmed with bronchoscopic visualization. The tracheostomy tube was then sutured in place with four nylon sutures. It was further secured with a tracheostomy tie.  Estimated blood loss: Less than 5 mL.  Complications: None immediate.  CXR ordered.  Erick Colace ACNP-BC [place] Pager # [phone] OR # [phone] if no answer

## 2019-10-20 NOTE — Progress Notes (Signed)
Occupational Therapy Treatment Patient Details Name: Riley Wagner MRN: [id number] DOB: 1956/04/04 Today's Date: 10/20/2019    History of present illness 64 y.o. male with a history of hypertension who was last known well prior to bed 5/15. He lost conciousness last night and fell, but refused transport with BP of [id number] systolic and negative stroke screen by EMS. On 5/16 pt with R weakness and apahasia on awakening. Pt found to have large penumbra on CT and taken for emergent IR thrombectomy of L MCA followed by L ICA coil embolization 2/2 bleeding. Pt extubated on 5/21.   OT comments  Pt fitted with resting hand splint Rt hand.  Wear schedule established and posted in room. RN notified.  Recommend he wear resting splint at night only.  Pt sedated on vent and unable to participate this date.    Follow Up Recommendations  SNF;Supervision/Assistance - 24 hour    Equipment Recommendations  Wheelchair (measurements OT);Wheelchair cushion (measurements OT);Hospital bed    Recommendations for Other Services      Precautions / Restrictions Precautions Precautions: Fall Precaution Comments: [id number] systolically; R HP Restrictions Weight Bearing Restrictions: No       Mobility Bed Mobility                  Transfers                      Balance                                           ADL either performed or assessed with clinical judgement   ADL                                               Vision       Perception     Praxis      Cognition Arousal/Alertness: Lethargic;Suspect due to medications Behavior During Therapy: Flat affect Overall Cognitive Status: Difficult to assess                                 General Comments: Pt did not rouse to OT this session         Exercises Other Exercises Other Exercises: Lt resting hand splint fitted to the pt.  Wear schedule established and RN notified     Shoulder Instructions       General Comments      Pertinent Vitals/ Pain       Pain Assessment: Faces Faces Pain Scale: No hurt  Home Living                                          Prior Functioning/Environment              Frequency           Progress Toward Goals  OT Goals(current goals can now be found in the care plan section)     ADL Goals Additional ADL Goal #3: Pt will tolerate splint wear Rt UE  Plan Discharge plan remains appropriate    Co-evaluation  AM-PAC OT "6 Clicks" Daily Activity     Outcome Measure   Help from another person eating meals?: Total Help from another person taking care of personal grooming?: Total Help from another person toileting, which includes using toliet, bedpan, or urinal?: Total Help from another person bathing (including washing, rinsing, drying)?: Total Help from another person to put on and taking off regular upper body clothing?: Total Help from another person to put on and taking off regular lower body clothing?: Total 6 Click Score: 6    End of Session Equipment Utilized During Treatment: Oxygen  OT Visit Diagnosis: Cognitive communication deficit (R41.841);Other abnormalities of gait and mobility (R26.89);Muscle weakness (generalized) (M62.81) Hemiplegia - Right/Left: (unsure) Hemiplegia - caused by: Cerebral infarction   Activity Tolerance Patient limited by lethargy   Patient Left in bed;with call bell/phone within reach;with nursing/sitter in room   Nurse Communication Mobility status        Time: [id number] OT Time Calculation (min): 12 min  Charges: OT General Charges $OT Visit: 1 Visit OT Treatments $Orthotics Fit/Training: 8-22 mins  [name]., OTR/L Acute Rehabilitation Services Pager [phone] Office [phone]    [name] M 10/20/2019, 10:58 PM

## 2019-10-20 NOTE — Progress Notes (Signed)
h  NAME:  Riley Wagner, MRN:  [id number], DOB:  11/27/1955, LOS: 62 ADMISSION DATE:  10/02/2019, CONSULTATION DATE:  10/02/2019 REFERRING MD:  Dr. Debbrah Alar, neuro IR, CHIEF COMPLAINT:  Altered mental status   Brief History   64 yo male former smoker brought to ER with hypertensive crisis (BP 212/110), Rt sided weakness and aphasia.  Found to have Lt terminal ICA occlusion.  Intubated for airway protection. Noted to have intraparenchymal bleed along posterior limb of IC as well as Rt sided bleed.  Had embolization of Lt ICA terminus.  Past Medical History  HTN, [facility] Events   5/16 Admit. To IR for embolization of Left ICA. Back to ICU intubated. BP goal 120-140 5/17: still hypertensive in spite of maximum dose Cleviprex.  Continues to exhibit right-sided hemiparesis 5/18: CT brain showing worsening midline shift and cerebral edema.  Still having difficulty with blood pressure control, requiring titration up of oral regimen.  Of note was made DO NOT RESUSCITATE by family on 5/17 5/20 bp under control. Moving all ext except RUE. Spoke w/ family; leaning towards attempt at extubation and trach if fails.  5/21 Extubated  5/28 Planning for placement of PEG today   Consults:  Neuro IR  Procedures:  ETT 5/16 >> 5/21 Radial aline 5/16 >>  Significant Diagnostic Tests:   CT head 5/16 >> acute cytotoxic edema in Lt insula and frontal operculum, extensive chronic small vessel ischemic changes  Echo 5/17 >> Left Ventricle: Left ventricular ejection fraction, by estimation, is 55 to 60%. The left ventricle has normal function. The left ventricle has no regional wall motion abnormalities. The left ventricular internal cavity size was normal in size. There is mild concentric left ventricular hypertrophy. Left ventricular diastolic parameters are consistent with Grade II diastolic dysfunction (pseudonormalization). Elevated left ventricular end-diastolic pressure.  CT brain 5/18:  small acute infarct in the right parietal cortex, no new hemorrhage, left MCA infarct with progressive cytotoxic edema/swelling in 6 mm midline shif  CT brain 5/21: 1. Left more than right cerebral infarcts without progression from brain MRI 2 days ago. Cytotoxic edema causes 1 cm of rightward shift.2. No interval hemorrhage.  CT brain 6/1: unchanged.   CXR 6/2: clear lung fields.  Micro Data:  SARS CoV2 PCR 5/16 >> negative MRSA PCR 5/16 > negative Respiratory culture 5/18: nml flora  Blood culture 5/22 >   Antimicrobials:  Unasyn 5/22 > 5/29  Interim history/subjective:   Increased respiratory distress yesterday. Brought to ICU and intubated. Goals of care discussed again with family and decision to proceed with intubation and tracheostomy was maintained.  Hypotensive overnight.   Objective   Blood pressure 117/69, pulse (!) 101, temperature 99.4 F (37.4 C), temperature source Axillary, resp. rate 20, height 5\' 7"  (1.702 m), weight 73.7 kg, SpO2 100 %.    Vent Mode: PRVC FiO2 (%):  [40 %-100 %] 40 % Set Rate:  [10 bmp-16 bmp] 16 bmp Vt Set:  [520 mL] 520 mL PEEP:  [5 cmH20-8 cmH20] 5 cmH20 Plateau Pressure:  [14 cmH20-17 cmH20] 14 cmH20   Intake/Output Summary (Last 24 hours) at 10/20/2019 0902 Last data filed at 10/20/2019 0715 Gross per 24 hour  Intake 4482.86 ml  Output 425 ml  Net 4057.86 ml   Filed Weights   10/17/19 0500 10/19/19 2030 10/20/19 0500  Weight: 75.3 kg 74.5 kg 73.7 kg    Examination: General: Chronically ill appearing elderly male lying in bed,intubated and diaphoretic. HEENT: [place]/AT, MM pink/moist, PERRL,  sclera non-icteric  Neuro: more somnolent, moves left side spontaneously but not to command.  Remains hemiplegic on the right. CV: s1s2 regular rate and rhythm, no murmur, rubs, or gallops,  PULM: calm and synchronous on ventilation. Chest clear.   GI: Abdominal binder in place following PEG tube insertion. Extremities: warm/dry, mild edema.   Edema  Skin: no rashes or lesions  Resolved Hospital Problem list   non-anion anion gap metabolic acidosis Acute hypoxic respiratory insufficiency with compromised airway. -Extubated 5/21 Hypertensive emergency Therapeutic hypernatremia, mild hyperchloremic   Assessment & Plan:   L MCA infarcts due to left M1 occlusion s/p unsuccessful L MCA thrombectomy  L MCA infarct likely secondary to L ICA large vessel disease.  Right MCA infarcts in setting of cocaine use and right ICA and MCA large vessel stenoses    Cerebral edema  -MRI brain showed evolving bilateral cerebral infarcts, large left MCA infarct, unchanged cytotoxic edema with 8 mm of rightward midline shift.  There is negative flow in the intracranial left ICA left MCA or left A1 segment -Prognosis for meaningful functional recovery is guarded, but family continues to wish to give him time to see if he is able to may some more meaningful recovery.   Increasing respiratory failure due to inability to protect airway.  Discussed with family. They would still like to proceed with tracheostomy. - For tracheostomy today.   Aspiration pneumonia  -Completed course of antibiotics.   HX of hypertension  Hx of HLD. -Oral medications optimized by neurology   CKD2. Serum creatinine stable has risen and patient was hypotensive overnight - Will fluid resuscitate.    Daily Goals Checklist  Pain/Anxiety/Delirium protocol (if indicated): fentanyl, enteral oxycodone Neuro vitals: every 4 hours AED's: none VAP protocol (if indicated): bundle in place. Respiratory support goals: Trach collar following trach placement Blood pressure target: MAP> 65 DVT prophylaxis: UFH Nutrition Status: continue tube feeds post tracheostomy. GI prophylaxis: PPI bid for esophagitis. Fluid status goals: Maintenance fluids and bolus today. Continue free water for hypernatremia. Urinary catheter: external catheter Central lines: PIV only Glucose control:  adequate control on current regimen Mobility/therapy needs: bedrest. Antibiotic de-escalation: no antibiotics Home medication reconciliation: on oral antihypertensive agents.  Daily labs: CBC, BMET  Code Status: DNR Family Communication: updated yesterday Disposition: ICU   Labs   CBC: Recent Labs  Lab 10/16/19 0630 10/16/19 0630 10/17/19 0937 10/18/19 1436 10/19/19 0511 10/20/19 0004 10/20/19 0638  WBC 16.9*  --  16.8* 17.0* 22.0*  --  19.6*  NEUTROABS  --   --   --  12.9*  --   --   --   HGB 12.4*   < > 13.4 13.3 15.1 13.9 12.3*  HCT 38.1*   < > 41.3 41.1 45.6 41.0 38.4*  MCV 94.1  --  95.2 93.6 93.4  --  94.8  PLT 543*  --  636* 676* 688*  --  632*   < > = values in this interval not displayed.    Basic Metabolic Panel: Recent Labs  Lab 10/16/19 0630 10/16/19 0630 10/17/19 [id number] 10/18/19 0705 10/19/19 0511 10/20/19 0004 10/20/19 0638  NA 150*   < > 148* 149* 146* 151* 150*  K 3.3*   < > 3.4* 3.4* 4.9 3.9 4.3  CL 116*  --  119* 118* 116*  --  121*  CO2 20*  --  18* 20* 18*  --  18*  GLUCOSE 146*  --  152* 141* 167*  --  204*  BUN 44*  --  40* 35* 35*  --  70*  CREATININE 1.39*  --  1.33* 1.36* 1.38*  --  2.65*  CALCIUM 8.6*  --  9.0 8.8* 9.1  --  8.5*   < > = values in this interval not displayed.   GFR: Estimated Creatinine Clearance: 26 mL/min (A) (by C-G formula based on SCr of 2.65 mg/dL (H)). Recent Labs  Lab 10/17/19 0937 10/18/19 1436 10/19/19 0511 10/20/19 0638  WBC 16.8* 17.0* 22.0* 19.6*    Liver Function Tests: Recent Labs  Lab 10/17/19 0937 10/18/19 0705 10/19/19 0511  AST 128* 163* 169*  ALT 282* 327* 414*  ALKPHOS 106 112 135*  BILITOT 0.5 0.4 0.7  PROT 6.8 6.5 7.4  ALBUMIN 2.3* 2.1* 2.4*   No results for input(s): LIPASE, AMYLASE in the last 168 hours. No results for input(s): AMMONIA in the last 168 hours.  ABG    Component Value Date/Time   PHART 7.336 (L) 10/20/2019 0004   PCO2ART 41.3 10/20/2019 0004   PO2ART 281  (H) 10/20/2019 0004   HCO3 21.8 10/20/2019 0004   TCO2 23 10/20/2019 0004   ACIDBASEDEF 3.0 (H) 10/20/2019 0004   O2SAT 100.0 10/20/2019 0004     Coagulation Profile: No results for input(s): INR, PROTIME in the last 168 hours.  Cardiac Enzymes: No results for input(s): CKTOTAL, CKMB, CKMBINDEX, TROPONINI in the last 168 hours.  HbA1C: Hgb A1c MFr Bld  Date/Time Value Ref Range Status  10/03/2019 02:15 AM 5.9 (H) 4.8 - 5.6 % Final    Comment:    (NOTE)         Prediabetes: 5.7 - 6.4         Diabetes: >6.4         Glycemic control for adults with diabetes: <7.0     CBG: Recent Labs  Lab 10/19/19 1157 10/19/19 1503 10/19/19 2317 10/20/19 0328 10/20/19 0758  GLUCAP 179* 145* 174* 211* 159*   CRITICAL CARE Performed by: Kipp Brood   Total critical care time: 40 minutes  Critical care time was exclusive of separately billable procedures and treating other patients.  Critical care was necessary to treat or prevent imminent or life-threatening deterioration.  Critical care was time spent personally by me on the following activities: development of treatment plan with patient and/or surrogate as well as nursing, discussions with consultants, evaluation of patient's response to treatment, examination of patient, obtaining history from patient or surrogate, ordering and performing treatments and interventions, ordering and review of laboratory studies, ordering and review of radiographic studies, pulse oximetry, re-evaluation of patient's condition and participation in multidisciplinary rounds.  Kipp Brood, MD [facility] ICU Physician [place]  Pager: [phone] Mobile: [phone] After hours: [phone].    10/20/2019, 9:02 AM

## 2019-10-20 NOTE — Progress Notes (Signed)
SLP Cancellation Note  Patient Details Name: Riley Wagner MRN: [id number] DOB: 02-23-1956   Cancelled treatment:       Reason Eval/Treat Not Completed: Medical issues which prohibited therapy. Pt now intubated and back in ICU. Will f/u as able.    Osie Bond., M.A. [place] [facility] Pager [phone] Office [phone]  10/20/2019, 8:48 AM

## 2019-10-20 NOTE — Procedures (Signed)
Bronchoscopy Procedure Note Riley Wagner [id number] 14-Dec-1955  Procedure: Bronchoscopy Indications: procedural guidance for tracheostomy.  Procedure Details Consent: Risks of procedure as well as the alternatives and risks of each were explained to the (patient/caregiver).  Consent for procedure obtained. Time Out: Verified patient identification, verified procedure, site/side was marked, verified correct patient position, special equipment/implants available, medications/allergies/relevent history reviewed, required imaging and test results available.  Performed  In preparation for procedure, patient was given 100% FiO2 and bronchoscope lubricated. Sedation: Benzodiazepines, Muscle relaxants and propofol and fentanyl.  Airway entered and the following bronchi were examined: main airway visualized throughout procedure. Tracheostomy position confirmed post-procedure.       Riley Wagner 10/20/2019

## 2019-10-21 DIAGNOSIS — Z9911 Dependence on respirator [ventilator] status: Secondary | ICD-10-CM

## 2019-10-21 DIAGNOSIS — Z930 Tracheostomy status: Secondary | ICD-10-CM

## 2019-10-21 LAB — BASIC METABOLIC PANEL
Anion gap: 11 (ref 5–15)
BUN: 51 mg/dL — ABNORMAL HIGH (ref 8–23)
CO2: 18 mmol/L — ABNORMAL LOW (ref 22–32)
Calcium: 8.1 mg/dL — ABNORMAL LOW (ref 8.9–10.3)
Chloride: 120 mmol/L — ABNORMAL HIGH (ref 98–111)
Creatinine, Ser: 1.58 mg/dL — ABNORMAL HIGH (ref 0.61–1.24)
GFR calc Af Amer: 52 mL/min — ABNORMAL LOW (ref >60)
GFR calc non Af Amer: 45 mL/min — ABNORMAL LOW (ref >60)
Glucose, Bld: 192 mg/dL — ABNORMAL HIGH (ref 70–99)
Potassium: 3.8 mmol/L (ref 3.5–5.1)
Sodium: 149 mmol/L — ABNORMAL HIGH (ref 135–145)

## 2019-10-21 LAB — GLUCOSE, CAPILLARY
Glucose-Capillary: 177 mg/dL — ABNORMAL HIGH (ref 70–99)
Glucose-Capillary: 169 mg/dL — ABNORMAL HIGH (ref 70–99)
Glucose-Capillary: 164 mg/dL — ABNORMAL HIGH (ref 70–99)
Glucose-Capillary: 147 mg/dL — ABNORMAL HIGH (ref 70–99)
Glucose-Capillary: 170 mg/dL — ABNORMAL HIGH (ref 70–99)
Glucose-Capillary: 120 mg/dL — ABNORMAL HIGH (ref 70–99)
Glucose-Capillary: 159 mg/dL — ABNORMAL HIGH (ref 70–99)

## 2019-10-21 LAB — CBC
HCT: 34.9 % — ABNORMAL LOW (ref 39.0–52.0)
Hemoglobin: 11.1 g/dL — ABNORMAL LOW (ref 13.0–17.0)
MCH: 30.1 pg (ref 26.0–34.0)
MCHC: 31.8 g/dL (ref 30.0–36.0)
MCV: 94.6 fL (ref 80.0–100.0)
Platelets: 637 10*3/uL — ABNORMAL HIGH (ref 150–400)
RBC: 3.69 MIL/uL — ABNORMAL LOW (ref 4.22–5.81)
RDW: 15.4 % (ref 11.5–15.5)
WBC: 21.6 10*3/uL — ABNORMAL HIGH (ref 4.0–10.5)
nRBC: 0.1 % (ref 0.0–0.2)

## 2019-10-21 LAB — URINALYSIS, COMPLETE (UACMP) WITH MICROSCOPIC
Bilirubin Urine: NEGATIVE
Glucose, UA: NEGATIVE mg/dL
Hgb urine dipstick: NEGATIVE
Ketones, ur: NEGATIVE mg/dL
Leukocytes,Ua: NEGATIVE
Nitrite: NEGATIVE
Protein, ur: 30 mg/dL — AB
Specific Gravity, Urine: 1.024 (ref 1.005–1.030)
pH: 5 (ref 5.0–8.0)

## 2019-10-21 MED ORDER — FENTANYL CITRATE (PF) 100 MCG/2ML IJ SOLN
25.0000 ug | INTRAMUSCULAR | Status: DC | PRN
  Administered 2019-10-22 (×4): 50 ug via INTRAVENOUS
  Administered 2019-10-24 – 2019-10-26 (×6): 100 ug via INTRAVENOUS
  Filled 2019-10-21 (×9): qty 2

## 2019-10-21 MED ORDER — ENOXAPARIN SODIUM 40 MG/0.4ML ~~LOC~~ SOLN
40.0000 mg | Freq: Every day | SUBCUTANEOUS | Status: DC
  Administered 2019-10-21 – 2019-10-26 (×6): 40 mg via SUBCUTANEOUS
  Filled 2019-10-21 (×6): qty 0.4

## 2019-10-21 MED ORDER — DOCUSATE SODIUM 50 MG/5ML PO LIQD: 100.0000 mg | Freq: Two times a day (BID) | ORAL | Status: DC | PRN

## 2019-10-21 MED ORDER — FREE WATER
200.0000 mL | Status: DC
  Administered 2019-10-21 – 2019-10-25 (×21): 200 mL

## 2019-10-21 MED ORDER — SODIUM CHLORIDE 0.9 % IV SOLN: INTRAVENOUS | Status: DC

## 2019-10-21 MED ORDER — SODIUM CHLORIDE 0.9 % IV SOLN
2.0000 g | Freq: Once | INTRAVENOUS | Status: AC
  Administered 2019-10-21: 2 g via INTRAVENOUS
  Filled 2019-10-21: qty 2

## 2019-10-21 MED ORDER — FENTANYL CITRATE (PF) 100 MCG/2ML IJ SOLN
25.0000 ug | INTRAMUSCULAR | Status: DC | PRN
  Administered 2019-10-21: 100 ug via INTRAVENOUS
  Administered 2019-10-21: 50 ug via INTRAVENOUS
  Administered 2019-10-21: 100 ug via INTRAVENOUS
  Filled 2019-10-21 (×2): qty 2

## 2019-10-21 NOTE — Progress Notes (Signed)
eLink Physician-Brief Progress Note Patient Name: Riley Wagner DOB: [birth year] MRN: [id number]   Date of Service  10/21/2019  HPI/Events of Note  Request for left arm restraint. Patient seen with left arm flailing, mittens on.  eICU Interventions  Soft left wrist restraint ordered     Intervention Category Minor Interventions: Agitation / anxiety - evaluation and management  Judd Lien 10/21/2019, 9:01 PM

## 2019-10-21 NOTE — Progress Notes (Signed)
SLP Cancellation Note  Patient Details Name: Riley Wagner MRN: [id number] DOB: 04/12/1956   Cancelled treatment:       Reason Eval/Treat Not Completed: Medical issues which prohibited therapy. Pt on vent, trach on previous date. Will continue to follow.     Osie Bond., M.A. [place] [facility] Pager [phone] Office [phone]  10/21/2019, 7:59 AM

## 2019-10-21 NOTE — Progress Notes (Signed)
Physical Therapy Treatment Patient Details Name: Riley Wagner MRN: [id number] DOB: 1955/08/21 Today's Date: 10/21/2019    History of Present Illness 64 y.o. male with a history of hypertension who was last known well prior to bed 5/15. He lost conciousness last night and fell, but refused transport with BP of 836 systolic and negative stroke screen by EMS. On 5/16 pt with R weakness and apahasia on awakening. Pt found to have large penumbra on CT and taken for emergent IR thrombectomy of L MCA followed by L ICA coil embolization 2/2 bleeding. Pt extubated on 5/21.    PT Comments    Patient with limited progress now s/p tracheostomy and though without sedation per RN patient not following commands as well as last session.  Still with R inattention and eyes not crossing midline.  Able to attempt to reach to face to remove washcloth, but with apraxia and needing hand over hand assist.  Patient remains appropriate for SNF level rehab at d/c.  PT to follow. Will update goals/POC next session.   Follow Up Recommendations  SNF;Supervision/Assistance - 24 hour     Equipment Recommendations  Wheelchair (measurements PT);Wheelchair cushion (measurements PT);Hospital bed    Recommendations for Other Services       Precautions / Restrictions Precautions Precautions: Fall Precaution Comments: OQ<947 systolically; R HP, trach, PEG    Mobility  Bed Mobility Overal bed mobility: Needs Assistance             General bed mobility comments: scooted up to EOB with +2 A RN assist, bed up to chair position with positioning  Transfers                    Ambulation/Gait                 Stairs             Wheelchair Mobility    Modified Rankin (Stroke Patients Only) Modified Rankin (Stroke Patients Only) Pre-Morbid Rankin Score: No symptoms Modified Rankin: Severe disability     Balance                                            Cognition  Arousal/Alertness: Awake/alert Behavior During Therapy: Flat affect Overall Cognitive Status: Difficult to assess                     Current Attention Level: Focused   Following Commands: Follows one step commands inconsistently              Exercises Other Exercises Other Exercises: PROM R UE/LE with stretch to hamstrings heel cords, AROM L UE, PROM R LE    General Comments General comments (skin integrity, edema, etc.): noted secretions falling out when bed up to chair position, assist to suction, patient with trach on vent PS/CPAP 40% FiO2 PEEP 5      Pertinent Vitals/Pain Pain Assessment: Faces Faces Pain Scale: Hurts a little bit Pain Location: R LE with stretching Pain Descriptors / Indicators: Grimacing Pain Intervention(s): Monitored during session    Home Living                      Prior Function            PT Goals (current goals can now be found in the care plan section) Progress towards PT goals:  Not progressing toward goals - comment    Frequency    Min 3X/week      PT Plan Current plan remains appropriate    Co-evaluation              AM-PAC PT "6 Clicks" Mobility   Outcome Measure  Help needed turning from your back to your side while in a flat bed without using bedrails?: Total Help needed moving from lying on your back to sitting on the side of a flat bed without using bedrails?: Total Help needed moving to and from a bed to a chair (including a wheelchair)?: Total Help needed standing up from a chair using your arms (e.g., wheelchair or bedside chair)?: Total Help needed to walk in hospital room?: Total Help needed climbing 3-5 steps with a railing? : Total 6 Click Score: 6    End of Session   Activity Tolerance: Patient tolerated treatment well Patient left: in bed;with call bell/phone within reach;with bed alarm set Nurse Communication: Mobility status;Need for lift equipment PT Visit Diagnosis: Other  abnormalities of gait and mobility (R26.89);Muscle weakness (generalized) (M62.81);Hemiplegia and hemiparesis;Other symptoms and signs involving the nervous system (R29.898) Hemiplegia - Right/Left: Right Hemiplegia - dominant/non-dominant: (unknown) Hemiplegia - caused by: Cerebral infarction     Time: 8978-4784 PT Time Calculation (min) (ACUTE ONLY): 28 min  Charges:  $Therapeutic Exercise: 8-22 mins $Therapeutic Activity: 8-22 mins                     Magda Kiel, PT Acute Rehabilitation Services [phone] 10/21/2019    Reginia Naas 10/21/2019, 6:03 PM

## 2019-10-21 NOTE — Progress Notes (Addendum)
STROKE TEAM PROGRESS NOTE   INTERVAL HISTORY Patient daughter is at bedside. Pt had a tracheostomy yesterday, this morning, he was lying in bed comfortably, however, intermittent coughing, requiring trach suctioning.  Vital signs stable.  However he still has high-grade fever overnight, and worsening leukocytosis, CCM on board, on cefepime.  Vitals:   10/21/19 1600 10/21/19 1700 10/21/19 1800 10/21/19 1900  BP: 122/78 126/71 132/84 114/77  Pulse: (!) 105 100 97 (!) 106  Resp: (!) 24 (!) 21 (!) 27 (!) 23  Temp: 100.3 F (37.9 C)     TempSrc: Axillary     SpO2: 100% 99% 99% 99%  Weight:      Height:       CBC:  Recent Labs  Lab 10/18/19 1436 10/19/19 0511 10/20/19 0638 10/21/19 0709  WBC 17.0*   < > 19.6* 21.6*  NEUTROABS 12.9*  --   --   --   HGB 13.3   < > 12.3* 11.1*  HCT 41.1   < > 38.4* 34.9*  MCV 93.6   < > 94.8 94.6  PLT 676*   < > 632* 637*   < > = values in this interval not displayed.   Basic Metabolic Panel:  Recent Labs  Lab 10/20/19 0638 10/21/19 0709  NA 150* 149*  K 4.3 3.8  CL 121* 120*  CO2 18* 18*  GLUCOSE 204* 192*  BUN 70* 51*  CREATININE 2.65* 1.58*  CALCIUM 8.5* 8.1*   IMAGING past 24 hours  No results found.  PHYSICAL EXAM   Temp:  [98.3 F (36.8 C)-101.3 F (38.5 C)] 100.3 F (37.9 C) (06/04 1600) Pulse Rate:  [85-106] 106 (06/04 1900) Resp:  [16-31] 23 (06/04 1900) BP: (84-150)/(63-92) 114/77 (06/04 1900) SpO2:  [98 %-100 %] 99 % (06/04 1900) FiO2 (%):  [40 %] 40 % (06/04 1600) Weight:  [72.7 kg] 72.7 kg (06/04 0500)  General - Well nourished, well developed, on trach off precedex  Ophthalmologic - fundi not visualized due to noncooperation.  Cardiovascular - Regular rate and rhythm.  Neuro - on trach off precedex, eyes open, not following commands. Eyes in left gaze position, not blinking to visual threat on the right, doll's eyes present, not tracking in right visual field but tracking on the left, PERRL. Corneal reflex  present, gag and cough present. Facial symmetry not able to test due to ET tube.  Tongue protrusion not cooperative. On pain stimulation, LUE at least 3/5 and LLE 2+/5, but no movement on the RUE and slight withdraw at RLE, increased muscle tone on the right. Sensation, coordination and gait not tested.    ASSESSMENT/PLAN Riley Wagner is a 64 y.o. male with history of HTN who fell the night prior to admission, presenting with right sided weakness and aphasia. Taken to IR for terminus Left ICA occlusion.  Stroke:   L MCA infarcts due to left M1 occlusion s/p unsuccessful L MCA thrombectomy - L MCA infarct likely secondary to L ICA large vessel disease.  Stroke:   Right MCA infarcts in setting of cocaine use and right ICA and MCA large vessel stenoses     Code Stroke CT head cytotoxic edema L insula and frontal operculum. Likely old L parietal cortical and subcortical abnormality. ASPECTS 8    CTA head & neck L ICA occlusion. Widespread atherosclerosis: B CCA to ICA bifurcation R 40%, L 50% w/ extensive soft plaques at bifurcation and bulbs, L 30-40%. Severe B ICA siphon stenoses. R MCA and B  PCA atherosclerosis w/ narrowing & irregularity.  CT perfusion 105 penumbra L MCA territory.   Cerebral angio - 10/02/19 - [name], MD - diffuse and severe intracranial atherosclerosis. Proximal L M1 occlusion. Complete recanalization L M1 w/ embotrap w/ poor distal flow d/t severe L ICA stenosis s/p angioplasty. L ICA terminus coil embolization w/ sparing L anterior choroidal.  CT head repeat 5/16 large L basal ganglia hemorrhage/contrast posterior SDH and SAH. Large cytotoxic edema L MCA w/ 22mm midline shift.   CT head 5/17 diminishing SAH contrast. L MCA infarct w/o significant mass effect  CT head 5/18 diminishing SAH. L MCA infarct increased edema, MLS 43mm. Right parietal small new infarct.   MRI evolving B cerebral infarcts including large L MCA infarct w/ edema and 31mm R  midline shift, right parietal and frontal infarcts. No significant hemorrhage.  MRA No flow L carotid systeme following thrombectomy. Advanced intracranial atherosclerosis including moderate R ICA, mild R M1, severe proximal R M2 and severe L V4 stenoses.    CT repeat 5/21 L>R infarct w/o progression. 1cm R midline shift. No hemorrhage   CT repeat 5/23 - Continued interval evolution of left greater than right ischemic infarcts with unchanged 1cm of left-to-right shift.   CT repeat 6/1 - continued interval evolution of left large infarct, MLS at 44mm  2D Echo EF 55-60%   TG 471 and direct LDL 95.3, goal < 70  HgbA1c 5.9   UDS + cocaine and THC  lovenox 40 for VTE prophylaxis  No antithrombotic prior to admission, now on ASA 325mg  and plavix for B ICA severe stenoses. Continue DAPT x 3 months then aspirin alone.   Therapy recommendations:  SNF   Disposition: pending   Cerebral Edema, resolving  Present on admission scan  Repeat CT head 5/17 large left MCA infarct with edema but no significant midline shift  CT 5/18 repeat L MCA infarct increased edema, MLS 55mm. Right parietal small new infarct.  MRI 5/19 - large L MCA infarct with MLS 32mm  CT 5/21 - stable b/l infarct with MLS 1cm  CT 5/23 - unchanged 10 mm of left-to-right shift.   CT 6/1 - 62mm left to right MLS  Na 149-146-151-150  Acute hypoxic respiratory failure with compromised airway  Intubated for IR, continued d/t compromised airway  CXR 5/21 - increased left lung base consolidation/atelectasis. pulm vasc congestion  CXR 6/1 - Low volume chest with atelectasis or mild infiltrate at the left base.  Extubated 5/21  Still has difficulty with oral secretions  On CPT, neb, NT suction -> developed acute respiratory failure -> BiPAP -> Reintubated 6/2 ->Trach 6/3  Sepsis, septic shock Fever  Pneumonia, likely aspiration  Tmax 100.8->100.5->100.6->101.6->102.9  Leukocytosis -  16.8->17.0->22.0->19.6->21.6  UA - 10/18/19 neg, repeat pending  CXR 6/1 - Low volume chest with atelectasis or mild infiltrate at the left base.  CXR 6/2 - Hazy airspace opacity at the left lung base which could be due to atelectasis and/or infectious etiology.  Sputum culture 5/18 normal respiratory flora  PICC out d/t temp. Has PIV  Blood culture 5/29  NGTD - repeat culture pending  Resp Cx 5/29 - rare GN - NOF  Tylenol / motrin prn   Unasyn - 5/22>>5/29  Cefepime 5/29>>  VANC 5/29>>5/31  CCM on board now  Hypertensive Emergency Hypotension, septic shock and medication effect  SBP > 220 on arrival  Home meds:  Amlodipine 10, coreg 3.125 bid, HCTZ 25, lisinopril 40, holding at this  time due to hypotension, has labetalol prn  Off cleviprex now  On amlodipine 10, clonidine 0.1 tid, hydralazine 100 q8, labetalol 300 Q8, lisinopril 40, hygroton 25 - d/c'ed for significant hypotension 5/29   . BP stable 115-140 . SBP goal < 180  . Long-term BP goal normotensive  Hyperlipidemia  Home meds:  lipitor 40, resumed in hospital  Direct LDL 95.3, goal < 70  On lipitor 40 - d/c'ed 10/18/19 due to elevated LFTs  Consider statin at discharge  Dysphagia . Secondary to stroke . NPO . PEG placement 5/28 -> on tube feedings @55  . on free water 200 q4h  . IVF @ 50 . Speech on board  CKD stage III  Cre 1.39->1.33->1.36->1.38->2.65->1.58  Likely due to hypotension and sepsis  CCM on board  On IVF, TF and FW  Close monitoring  Elevated LFTs  AST/ALT -128/282 -> 163/327->169/414 -> pending  Hepatitis panel neg  Likely related to infection and sepsis  Statin discontinued for now  Cocaine abuse  UDS positive for cocaine  Cessation education will be provided   Other Stroke Risk Factors  Advanced age  Former Cigarette smoker, quit 5 yrs ago  ETOH use, advised to drink no more than 2 drink(s) a day  THC abuse - UDS:  THC POSITIVE, cessation education will  be provided.  Family hx stroke (mother)  Coronary artery disease, MI  Other Active Problems  Left eye conjunctivitis and subconjunctival hemorrhage - Ciprofloxacin drops started 5/21>>5/26 - improved  Constipation - Dulcolax sppositories  Hyperglycemia -> SSI  Lethargy. Amantadine 100 bid added 10/13/19  Hypokalemia - 3.4 ->4.9->4.3 - resolved  Hospital day # 19   Patient continues to be critically ill for the last 24 hours, has developed spiking fever, worsening leukocytosis, BP stabilized on IVF, FW and TF, continued on Abx, trach suctioning. I had long discussion with daughter at bedside, updated pt current condition, treatment plan and potential prognosis, and answered all the questions. She expressed understanding and appreciation.    This patient is critically ill due to worsening respiratory status, respiratory failure needing BIPAP and likely re-intubation, sepsis, spetic shock, leukocytosis, hypotension and at significant risk of neurological worsening, death form sepsis, heart failure, respiratory failure. This patient's care requires constant monitoring of vital signs, hemodynamics, respiratory and cardiac monitoring, review of multiple databases, neurological assessment, discussion with family, other specialists and medical decision making of high complexity. I spent 35 minutes of neurocritical care time in the care of this patient.   Riley Hawking, MD PhD Stroke Neurology 10/21/2019 7:58 PM   To contact Stroke Continuity provider, please refer to [email or web address] After hours, contact General Neurology

## 2019-10-21 NOTE — Progress Notes (Addendum)
NAME:  Riley Wagner, MRN:  [id number], DOB:  1955-05-25, LOS: 66 ADMISSION DATE:  10/02/2019, CONSULTATION DATE:  10/02/2019 REFERRING MD:  Dr. Debbrah Alar, neuro IR, CHIEF COMPLAINT:  Altered mental status   Brief History   64 y/o male former smoker brought to ER with hypertensive crisis (BP 212/110), Rt sided weakness and aphasia.  Found to have Lt terminal ICA occlusion.  Intubated for airway protection. Noted to have intraparenchymal bleed along posterior limb of IC as well as Rt sided bleed.  Had embolization of Lt ICA terminus.  Past Medical History  HTN, [place] [facility] Events   5/16 Admit. To IR for embolization of Left ICA. Back to ICU intubated. BP goal 120-140 5/17 still hypertensive in spite of maximum dose Cleviprex.  Continues to exhibit right-sided hemiparesis 5/18 CT brain showing worsening midline shift and cerebral edema.  Still having difficulty with blood pressure control, requiring titration up of oral regimen.  Of note was made DO NOT RESUSCITATE by family on 5/17 5/20 bp under control. Moving all ext except RUE. Family; leaning towards attempt at extubation and trach if fails.  5/21 Extubated  5/28 Planning for placement of PEG today  6/02 Increased respiratory distress yesterday. Brought to ICU and intubated.  Hypotensive overnight.   Consults:  Neuro IR  Procedures:  ETT 5/16 >> 5/21 Radial aline 5/16 >>  Significant Diagnostic Tests:   UDS 5/16 >> positive for cocaine, THC  CT head 5/16 >> acute cytotoxic edema in Lt insula and frontal operculum, extensive chronic small vessel ischemic changes  Echo 5/17 >> Left Ventricle: Left ventricular ejection fraction, by estimation, is 55 to 60%. The left ventricle has normal function. The left ventricle has no regional wall motion abnormalities. The left ventricular internal cavity size was normal in size. There is mild concentric left ventricular hypertrophy. Left ventricular diastolic parameters are  consistent with Grade II diastolic dysfunction (pseudonormalization). Elevated left ventricular end-diastolic pressure.  CT brain 5/18 >> small acute infarct in the right parietal cortex, no new hemorrhage, left MCA infarct with progressive cytotoxic edema/swelling in 6 mm midline shif  CT brain 5/21 >> Left more than right cerebral infarcts without progression from brain MRI 2 days ago. Cytotoxic edema causes 1 cm of rightward shift.2. No interval hemorrhage.  CT brain 6/1 >> unchanged. Note paranasal sinus disease, mucosal thickening/frothy secretions in right maxillary sinus  Micro Data:  SARS CoV2 PCR 5/16 >> negative MRSA PCR 5/16 > negative Respiratory culture 5/18 >> nml flora  Blood culture 5/22 >> negative Tracheal aspirte 5/29 >> normal flora  BCx2 5/29 >> negative  UA 6/1 >> negative   Antimicrobials:  Unasyn 5/22 > 5/29 Cefepime 5/29 >>  Interim history/subjective:  Tmax 101.3 / WBC 21.6 On PSV wean 10/5, 40% Glucose range 160's  I/O 1.8L UOP, 684ml stool, 3.2L+  Objective   Blood pressure 103/70, pulse 86, temperature 99.4 F (37.4 C), temperature source Axillary, resp. rate (!) 28, height 5\' 7"  (1.702 m), weight 72.7 kg, SpO2 98 %.    Vent Mode: CPAP;PSV FiO2 (%):  [40 %] 40 % Set Rate:  [16 bmp] 16 bmp Vt Set:  [520 mL] 520 mL PEEP:  [5 cmH20] 5 cmH20 Pressure Support:  [10 cmH20] 10 cmH20 Plateau Pressure:  [16 cmH20-18 cmH20] 16 cmH20   Intake/Output Summary (Last 24 hours) at 10/21/2019 0935 Last data filed at 10/21/2019 0900 Gross per 24 hour  Intake 5468.2 ml  Output 2450 ml  Net 3018.2 ml  Filed Weights   10/19/19 2030 10/20/19 0500 10/21/19 0500  Weight: 74.5 kg 73.7 kg 72.7 kg    Examination: General: chronically ill appearing adult male lying in bed on vent in NAD  HEENT: MM pink/moist, trach midline c/d/i Neuro: eyes closed, no response to verbal stimuli, hemiplegic on right, spontaneous movement on left but not to command CV: s1s2 RRR, no  m/r/g PULM:  Non-labored on PSV, lungs bilaterally clear  GI: soft, bsx4 active, PEG tube in place, tolerating TF Extremities: warm/dry, trace generalized edema  Skin: no rashes or lesions  Resolved Hospital Problem list   Non-anion anion gap metabolic acidosis Acute hypoxic respiratory insufficiency with compromised airway-Extubated 5/21 Hypertensive emergency Therapeutic hypernatremia, mild hyperchloremic  Aspiration PNA  Assessment & Plan:   L MCA Infarcts due to left M1 occlusion s/p unsuccessful L MCA thrombectomy  L MCA infarct likely secondary to L ICA large vessel disease.  Right MCA infarcts in setting of cocaine use and right ICA and MCA large vessel stenoses    Cerebral edema  MRI brain showed evolving bilateral cerebral infarcts, large left MCA infarct, unchanged cytotoxic edema with 8 mm of rightward midline shift.  There is negative flow in the intracranial left ICA left MCA or left A1 segment -per Neurology  -prognosis for meaningful functional recovery guarded, family wants more time to see if he will recover  -PT efforts  -right hand brace -limit all sedating medications -LCB - no CPR / ACLS  -suspect he will need LTAC rehab   Acute Respiratory Failure due to inability to protect airway.  Recurrent Aspiration, LLL Airspace Disease / ?Effusion Had aspiration PNA, completed abx.  S/P trach 6/4.  Noted LLL airspace disease on CXR 6/2 / possible effusion.  -trach care per protocol  -PRVC 8cc/kg as rest mode  -PSV wean as tolerated  -follow intermittent CXR -plan for 7 days total cefepime for possible aspiration -if continued fevers, re-consider abx coverage 6/5 + assessment of left pleural space   Fever Potential for multiple sources > sinus disease on CT, neuro from CVA / edema, aspiration PNA / effusion on left, diarrhea, drug fever  -plan to stop abx on 6/5 for total of 14 days abx -monitor fever curve / WBC trend   HX of hypertension  Hx of HLD. -Oral  medications optimized by neurology  CKD 2. Hypernatremia  Baseline sr cr ~ 1.2. Noted rise of BUN/Cr 6/4, thought related to hypotension from 6/2-6/3 -increase free water to 275ml Q4 -Trend BMP / urinary output -Replace electrolytes as indicated -Avoid nephrotoxic agents, ensure adequate renal perfusion  Hyperglycemia  -monitor trend, goal 140-180  Diarrhea -stop scheduled motility agents / softeners  -monitor volume   Daily Goals Checklist  Pain/Anxiety/Delirium protocol (if indicated): fentanyl, enteral oxycodone VAP protocol (if indicated): bundle in place. DVT prophylaxis: UFH Nutrition Status: TF GI prophylaxis: PPI  Glucose control: adequate control on current regimen Code Status: DNR Family Communication:  Disposition: ICU   Labs   CBC: Recent Labs  Lab 10/17/19 0937 10/17/19 0937 10/18/19 1436 10/19/19 0511 10/20/19 0004 10/20/19 0638 10/21/19 0709  WBC 16.8*  --  17.0* 22.0*  --  19.6* 21.6*  NEUTROABS  --   --  12.9*  --   --   --   --   HGB 13.4   < > 13.3 15.1 13.9 12.3* 11.1*  HCT 41.3   < > 41.1 45.6 41.0 38.4* 34.9*  MCV 95.2  --  93.6 93.4  --  94.8 94.6  PLT 636*  --  676* 688*  --  632* 637*   < > = values in this interval not displayed.    Basic Metabolic Panel: Recent Labs  Lab 10/16/19 0630 10/16/19 0630 10/17/19 2409 10/18/19 0705 10/19/19 0511 10/20/19 0004 10/20/19 0638  NA 150*   < > 148* 149* 146* 151* 150*  K 3.3*   < > 3.4* 3.4* 4.9 3.9 4.3  CL 116*  --  119* 118* 116*  --  121*  CO2 20*  --  18* 20* 18*  --  18*  GLUCOSE 146*  --  152* 141* 167*  --  204*  BUN 44*  --  40* 35* 35*  --  70*  CREATININE 1.39*  --  1.33* 1.36* 1.38*  --  2.65*  CALCIUM 8.6*  --  9.0 8.8* 9.1  --  8.5*   < > = values in this interval not displayed.   GFR: Estimated Creatinine Clearance: 26 mL/min (A) (by C-G formula based on SCr of 2.65 mg/dL (H)). Recent Labs  Lab 10/18/19 1436 10/19/19 0511 10/20/19 0638 10/21/19 0709  WBC 17.0*  22.0* 19.6* 21.6*    Liver Function Tests: Recent Labs  Lab 10/17/19 0937 10/18/19 0705 10/19/19 0511  AST 128* 163* 169*  ALT 282* 327* 414*  ALKPHOS 106 112 135*  BILITOT 0.5 0.4 0.7  PROT 6.8 6.5 7.4  ALBUMIN 2.3* 2.1* 2.4*   No results for input(s): LIPASE, AMYLASE in the last 168 hours. No results for input(s): AMMONIA in the last 168 hours.  ABG    Component Value Date/Time   PHART 7.336 (L) 10/20/2019 0004   PCO2ART 41.3 10/20/2019 0004   PO2ART 281 (H) 10/20/2019 0004   HCO3 21.8 10/20/2019 0004   TCO2 23 10/20/2019 0004   ACIDBASEDEF 3.0 (H) 10/20/2019 0004   O2SAT 100.0 10/20/2019 0004     Coagulation Profile: Recent Labs  Lab 10/20/19 1019  INR 1.4*    Cardiac Enzymes: No results for input(s): CKTOTAL, CKMB, CKMBINDEX, TROPONINI in the last 168 hours.  HbA1C: Hgb A1c MFr Bld  Date/Time Value Ref Range Status  10/03/2019 02:15 AM 5.9 (H) 4.8 - 5.6 % Final    Comment:    (NOTE)         Prediabetes: 5.7 - 6.4         Diabetes: >6.4         Glycemic control for adults with diabetes: <7.0     CBG: Recent Labs  Lab 10/20/19 1539 10/20/19 1917 10/20/19 2319 10/21/19 0309 10/21/19 0822  GLUCAP 106* 136* 141* 169* 164*     CC Time: 35 minutes   Noe Gens, MSN, NP-C [facility] 10/21/2019, 9:35 AM   Please see [email or web address] for pager details.

## 2019-10-21 NOTE — Progress Notes (Signed)
eLink Physician-Brief Progress Note Patient Name: Riley Wagner DOB: 02-01-1956 MRN: [id number]   Date of Service  10/21/2019  HPI/Events of Note  Notified of coughing episodes with eventual vent desynchrony. Fentanyl 100 q 2 given with temporary relief.  eICU Interventions  Increased Fentanyl frequency to q 1 for now ( was on q 30 last night on top of Precedex) and discussed with bedside RN to try 50 mcg dose first.     Intervention Category Intermediate Interventions: Respiratory distress - evaluation and management  Judd Lien 10/21/2019, 11:51 PM

## 2019-10-21 NOTE — TOC Progression Note (Signed)
Transition of Care [facility]) - Progression Note    Patient Details  Name: Riley Wagner MRN: [id number] Date of Birth: 01/22/1956  [facility] CM/SW [place], [place] Phone Number: 10/21/2019, 3:26 PM  Clinical Narrative:     CSW called [facility]. CSW is informed that previous contact, Lilia Pro, whom CSW emailed referral to was filling in and currently at a different facility at this point. Receptionist explained that they do not have an admissions liason in today though that [facility] would be able to fax new referral and did confirm that their fax machine is currently working ([phone]). Receptionist did explain that they were familiar with and aware of pt's family member [place]. CSW resubmitted referral info via fax.   Expected Discharge Plan: Skilled Nursing Facility Barriers to Discharge: Continued Medical Work up, SNF Pending bed offer  Expected Discharge Plan and Services Expected Discharge Plan: [place]                                               Social Determinants of Health (SDOH) Interventions    Readmission Risk Interventions No flowsheet data found.

## 2019-10-22 ENCOUNTER — Inpatient Hospital Stay (HOSPITAL_COMMUNITY): Payer: Medicare Other

## 2019-10-22 DIAGNOSIS — I63132 Cerebral infarction due to embolism of left carotid artery: Secondary | ICD-10-CM

## 2019-10-22 DIAGNOSIS — T17908S Unspecified foreign body in respiratory tract, part unspecified causing other injury, sequela: Secondary | ICD-10-CM

## 2019-10-22 DIAGNOSIS — J690 Pneumonitis due to inhalation of food and vomit: Secondary | ICD-10-CM

## 2019-10-22 DIAGNOSIS — Z430 Encounter for attention to tracheostomy: Secondary | ICD-10-CM

## 2019-10-22 LAB — BASIC METABOLIC PANEL
Anion gap: 11 (ref 5–15)
BUN: 40 mg/dL — ABNORMAL HIGH (ref 8–23)
CO2: 19 mmol/L — ABNORMAL LOW (ref 22–32)
Calcium: 8 mg/dL — ABNORMAL LOW (ref 8.9–10.3)
Chloride: 120 mmol/L — ABNORMAL HIGH (ref 98–111)
Creatinine, Ser: 1.42 mg/dL — ABNORMAL HIGH (ref 0.61–1.24)
GFR calc Af Amer: 60 mL/min — ABNORMAL LOW (ref >60)
GFR calc non Af Amer: 51 mL/min — ABNORMAL LOW (ref >60)
Glucose, Bld: 146 mg/dL — ABNORMAL HIGH (ref 70–99)
Potassium: 4.8 mmol/L (ref 3.5–5.1)
Sodium: 150 mmol/L — ABNORMAL HIGH (ref 135–145)

## 2019-10-22 LAB — POCT I-STAT 7, (LYTES, BLD GAS, ICA,H+H)
Acid-base deficit: 4 mmol/L — ABNORMAL HIGH (ref 0.0–2.0)
Bicarbonate: 18.7 mmol/L — ABNORMAL LOW (ref 20.0–28.0)
Calcium, Ion: 1.23 mmol/L (ref 1.15–1.40)
HCT: 34 % — ABNORMAL LOW (ref 39.0–52.0)
Hemoglobin: 11.6 g/dL — ABNORMAL LOW (ref 13.0–17.0)
O2 Saturation: 97 %
Patient temperature: 97.7
Potassium: 3.8 mmol/L (ref 3.5–5.1)
Sodium: 152 mmol/L — ABNORMAL HIGH (ref 135–145)
TCO2: 19 mmol/L — ABNORMAL LOW (ref 22–32)
pCO2 arterial: 25.4 mmHg — ABNORMAL LOW (ref 32.0–48.0)
pH, Arterial: 7.473 — ABNORMAL HIGH (ref 7.350–7.450)
pO2, Arterial: 81 mmHg — ABNORMAL LOW (ref 83.0–108.0)

## 2019-10-22 LAB — CBC
HCT: 36.6 % — ABNORMAL LOW (ref 39.0–52.0)
Hemoglobin: 11.9 g/dL — ABNORMAL LOW (ref 13.0–17.0)
MCH: 30.7 pg (ref 26.0–34.0)
MCHC: 32.5 g/dL (ref 30.0–36.0)
MCV: 94.6 fL (ref 80.0–100.0)
Platelets: 682 10*3/uL — ABNORMAL HIGH (ref 150–400)
RBC: 3.87 MIL/uL — ABNORMAL LOW (ref 4.22–5.81)
RDW: 15.4 % (ref 11.5–15.5)
WBC: 28.5 10*3/uL — ABNORMAL HIGH (ref 4.0–10.5)
nRBC: 0 % (ref 0.0–0.2)

## 2019-10-22 LAB — GLUCOSE, CAPILLARY
Glucose-Capillary: 159 mg/dL — ABNORMAL HIGH (ref 70–99)
Glucose-Capillary: 131 mg/dL — ABNORMAL HIGH (ref 70–99)
Glucose-Capillary: 152 mg/dL — ABNORMAL HIGH (ref 70–99)
Glucose-Capillary: 137 mg/dL — ABNORMAL HIGH (ref 70–99)
Glucose-Capillary: 121 mg/dL — ABNORMAL HIGH (ref 70–99)
Glucose-Capillary: 118 mg/dL — ABNORMAL HIGH (ref 70–99)

## 2019-10-22 LAB — HEPATIC FUNCTION PANEL
ALT: 236 U/L — ABNORMAL HIGH (ref 0–44)
AST: 115 U/L — ABNORMAL HIGH (ref 15–41)
Albumin: 1.7 g/dL — ABNORMAL LOW (ref 3.5–5.0)
Alkaline Phosphatase: 126 U/L (ref 38–126)
Bilirubin, Direct: 0.3 mg/dL — ABNORMAL HIGH (ref 0.0–0.2)
Indirect Bilirubin: 0.6 mg/dL (ref 0.3–0.9)
Total Bilirubin: 0.9 mg/dL (ref 0.3–1.2)
Total Protein: 5.8 g/dL — ABNORMAL LOW (ref 6.5–8.1)

## 2019-10-22 MED ORDER — PANTOPRAZOLE SODIUM 40 MG IV SOLR
40.0000 mg | Freq: Every day | INTRAVENOUS | Status: DC
  Administered 2019-10-22: 40 mg via INTRAVENOUS
  Filled 2019-10-22: qty 40

## 2019-10-22 MED ORDER — DEXTROSE 5 % IV SOLN: INTRAVENOUS | Status: AC

## 2019-10-22 MED ORDER — ONDANSETRON HCL 4 MG/2ML IJ SOLN
INTRAMUSCULAR | Status: AC
  Filled 2019-10-22: qty 2

## 2019-10-22 NOTE — Progress Notes (Signed)
eLink Physician-Brief Progress Note Patient Name: Riley Wagner DOB: 1956/01/21 MRN: [id number]   Date of Service  10/22/2019  HPI/Events of Note  Persistently febrile 103. No new infiltrate on CXR. WBC 28. Loose stools.  eICU Interventions  Cooling blanket ordered. Recommend evaluate for Cdiff     Intervention Category Intermediate Interventions: Infection - evaluation and management  Judd Lien 10/22/2019, 6:49 AM

## 2019-10-22 NOTE — Progress Notes (Signed)
eLink Physician-Brief Progress Note Patient Name: Riley Wagner DOB: August 27, 1955 MRN: [id number]   Date of Service  10/22/2019  HPI/Events of Note  Noted to have what looks gastric contents around the trach site and to his back. Increased work of breathing but no desaturation. He was on tube feeding 55 cc/hr in addition to 200 cc q 4 water flushes. Febrile on cefepime.  eICU Interventions  Hold tube feeding for now. Place PEG to LIS. Get CXR and ABG     Intervention Category Intermediate Interventions: Respiratory distress - evaluation and management;Other:  Judd Lien 10/22/2019, 3:24 AM

## 2019-10-22 NOTE — Progress Notes (Signed)
STROKE TEAM PROGRESS NOTE   INTERVAL HISTORY  RN at bedside. Overnight event noted, pt had agitation and then aspirated. Currently, TF was on hold. On IVF and FW with po meds. CXR not worsening but continue to have fever and worsening leukocytosis. Pt lying in bed, on vent with weaning, no significant neuro change.    Vitals:   10/22/19 0336 10/22/19 0400 10/22/19 0500 10/22/19 0600  BP: 117/77 104/78 110/74 105/78  Pulse: (!) 107 (!) 110 (!) 107 (!) 116  Resp: (!) 22 20 (!) 22 (!) 23  Temp:  (!) 103.3 F (39.6 C)    TempSrc:  Oral    SpO2: 98% 98% 98% 99%  Weight:      Height:       CBC:  Recent Labs  Lab 10/18/19 1436 10/19/19 0511 10/21/19 0709 10/21/19 0709 10/22/19 0353 10/22/19 0446  WBC 17.0*   < > 21.6*  --   --  28.5*  NEUTROABS 12.9*  --   --   --   --   --   HGB 13.3   < > 11.1*   < > 11.6* 11.9*  HCT 41.1   < > 34.9*   < > 34.0* 36.6*  MCV 93.6   < > 94.6  --   --  94.6  PLT 676*   < > 637*  --   --  682*   < > = values in this interval not displayed.   Basic Metabolic Panel:  Recent Labs  Lab 10/21/19 0709 10/21/19 0709 10/22/19 0353 10/22/19 0446  NA 149*   < > 152* 150*  K 3.8   < > 3.8 4.8  CL 120*  --   --  120*  CO2 18*  --   --  19*  GLUCOSE 192*  --   --  146*  BUN 51*  --   --  40*  CREATININE 1.58*  --   --  1.42*  CALCIUM 8.1*  --   --  8.0*   < > = values in this interval not displayed.   IMAGING past 24 hours  DG Chest Port 1 View  Result Date: 10/22/2019 CLINICAL DATA:  Follow-up airway aspiration EXAM: PORTABLE CHEST 1 VIEW COMPARISON:  Three days ago FINDINGS: Improved aeration on the left. The tracheostomy tube is in stable position. Normal heart size and mediastinal contours. IMPRESSION: Improving left lung aeration.  No new abnormality. Electronically Signed   By: Monte Fantasia M.D.   On: 10/22/2019 06:33    PHYSICAL EXAM  Temp:  [98.3 F (36.8 C)-103.3 F (39.6 C)] 103.3 F (39.6 C) (06/05 0400) Pulse Rate:  [85-116] 116  (06/05 0600) Resp:  [18-31] 23 (06/05 0600) BP: (84-150)/(64-89) 105/78 (06/05 0600) SpO2:  [96 %-100 %] 99 % (06/05 0600) FiO2 (%):  [40 %] 40 % (06/05 0400)  General - Well nourished, well developed, on trach off precedex  Ophthalmologic - fundi not visualized due to noncooperation.  Cardiovascular - Regular rate and rhythm.  Neuro - on trach off precedex, eyes open, not following commands. Eyes in left gaze position, not blinking to visual threat on the right, doll's eyes present, not tracking in right visual field but tracking on the left, PERRL. Corneal reflex present, gag and cough present. Facial symmetry not able to test due to ET tube.  Tongue protrusion not cooperative. On pain stimulation, LUE at least 4/5 and LLE 2+/5, but no movement on the RUE and slight withdraw at RLE,  increased muscle tone on the right. Sensation, coordination and gait not tested.  Neuro exam not changed from yesterday  ASSESSMENT/PLAN Mr. Josey Forcier is a 64 y.o. male with history of HTN who fell the night prior to admission, presenting with right sided weakness and aphasia. Taken to IR for terminus Left ICA occlusion.  Stroke:   L MCA infarcts due to left M1 occlusion s/p unsuccessful L MCA thrombectomy - L MCA infarct likely secondary to L ICA large vessel disease.  Stroke:   Right MCA infarcts in setting of cocaine use and right ICA and MCA large vessel stenoses     Code Stroke CT head cytotoxic edema L insula and frontal operculum. Likely old L parietal cortical and subcortical abnormality. ASPECTS 8    CTA head & neck L ICA occlusion. Widespread atherosclerosis: B CCA to ICA bifurcation R 40%, L 50% w/ extensive soft plaques at bifurcation and bulbs, L 30-40%. Severe B ICA siphon stenoses. R MCA and B PCA atherosclerosis w/ narrowing & irregularity.  CT perfusion 105 penumbra L MCA territory.   Cerebral angio - 10/02/19 - [name], MD - diffuse and severe intracranial  atherosclerosis. Proximal L M1 occlusion. Complete recanalization L M1 w/ embotrap w/ poor distal flow d/t severe L ICA stenosis s/p angioplasty. L ICA terminus coil embolization w/ sparing L anterior choroidal.  CT head repeat 5/16 large L basal ganglia hemorrhage/contrast posterior SDH and SAH. Large cytotoxic edema L MCA w/ 11mm midline shift.   CT head 5/17 diminishing SAH contrast. L MCA infarct w/o significant mass effect  CT head 5/18 diminishing SAH. L MCA infarct increased edema, MLS 27mm. Right parietal small new infarct.   MRI evolving B cerebral infarcts including large L MCA infarct w/ edema and 46mm R midline shift, right parietal and frontal infarcts. No significant hemorrhage.  MRA No flow L carotid systeme following thrombectomy. Advanced intracranial atherosclerosis including moderate R ICA, mild R M1, severe proximal R M2 and severe L V4 stenoses.    CT repeat 5/21 L>R infarct w/o progression. 1cm R midline shift. No hemorrhage   CT repeat 5/23 - Continued interval evolution of left greater than right ischemic infarcts with unchanged 1cm of left-to-right shift.   CT repeat 6/1 - continued interval evolution of left large infarct, MLS at 50mm  2D Echo EF 55-60%   TG 471 and direct LDL 95.3, goal < 70  HgbA1c 5.9   UDS + cocaine and THC  lovenox 40 for VTE prophylaxis  No antithrombotic prior to admission, now on ASA 325mg  and plavix for B ICA severe stenoses. Continue DAPT x 3 months then aspirin alone.   Therapy recommendations:  SNF   Disposition: pending   Cerebral Edema, resolving  Present on admission scan  Repeat CT head 5/17 large left MCA infarct with edema but no significant midline shift  CT 5/18 repeat L MCA infarct increased edema, MLS 99mm. Right parietal small new infarct.  MRI 5/19 - large L MCA infarct with MLS 58mm  CT 5/21 - stable b/l infarct with MLS 1cm  CT 5/23 - unchanged 10 mm of left-to-right shift.   CT 6/1 - 75mm left to right  MLS  Na 149-146-151-150  Acute hypoxic respiratory failure with compromised airway  Intubated for IR, continued d/t compromised airway  CXR 5/21 - increased left lung base consolidation/atelectasis. pulm vasc congestion  CXR 6/1 - Low volume chest with atelectasis or mild infiltrate at the left base.  Extubated 5/21  Still has difficulty with oral secretions  On CPT, neb, NT suction -> developed acute respiratory failure -> BiPAP -> Reintubated 6/2 ->Trach 6/3  Sepsis, septic shock Fever  leukocytosis Pneumonia due to aspiration  Tmax 100.8->100.5->100.6->101.6->102.9->103.3  Leukocytosis - 16.8->17.0->22.0->19.6->21.6->28.5  UA - 10/18/19 neg, repeat 10/21/19 neg  CXR 6/1 - Low volume chest with atelectasis or mild infiltrate at the left base.  CXR 6/2 - Hazy airspace opacity at the left lung base which could be due to atelectasis and/or infectious etiology.  Sputum culture 5/18 normal respiratory flora  PICC out d/t temp. Has PIV  Blood culture 5/29  NGTD - repeat culture 10/21/19 - pending  Resp Cx 5/29 - rare GN - NOF  Tylenol / motrin prn   Unasyn - 5/22>>5/29  Cefepime 5/29>> 6/4  VANC 5/29>>5/31  CCM on board now  Hypertensive Emergency Hypotension, septic shock and medication effect  SBP > 220 on arrival  Home meds:  Amlodipine 10, coreg 3.125 bid, HCTZ 25, lisinopril 40, holding at this time due to hypotension, has labetalol prn  Off cleviprex now  On amlodipine 10, clonidine 0.1 tid, hydralazine 100 q8, labetalol 300 Q8, lisinopril 40, hygroton 25 - d/c'ed for significant hypotension 5/29   . BP stable 115-140 . SBP goal < 180  . Long-term BP goal normotensive  Hyperlipidemia  Home meds:  lipitor 40, resumed in hospital  Direct LDL 95.3, goal < 70  On lipitor 40 - d/c'ed 10/18/19 due to elevated LFTs  Consider statin at discharge  Dysphagia . Secondary to stroke . NPO . PEG placement 5/28 -> on tube feedings -> now on hold due to  aspiration 6/5 . on free water 200 q4h  . IVF @ 50 -> 75 . Speech on board  CKD stage III  Cre 1.39->1.33->1.36->1.38->2.65->1.58->1.42  Likely due to hypotension and sepsis  CCM on board  On IVF and FW  Close monitoring  Elevated LFTs, improving  AST/ALT -128/282 -> 163/327->169/414 ->115/236  Hepatitis panel neg  Likely related to infection and sepsis  Statin discontinued for now  Cocaine abuse  UDS positive for cocaine  Cessation education will be provided   Other Stroke Risk Factors  Advanced age  Former Cigarette smoker, quit 5 yrs ago  ETOH use, advised to drink no more than 2 drink(s) a day  THC abuse - UDS:  THC POSITIVE, cessation education will be provided.  Family hx stroke (mother)  Coronary artery disease, MI  Other Active Problems  Left eye conjunctivitis and subconjunctival hemorrhage - Ciprofloxacin drops started 5/21>>5/26 - improved  Constipation - Dulcolax sppositories  Hyperglycemia -> SSI  Lethargy. Amantadine 100 bid added 10/13/19  Hypokalemia - 3.4 ->4.9->4.3 - resolved  Hospital day # 20   Patient continues to be critically ill for the last 24 hours, has developed aspiration of TF content to trach and respiratory tract, worsening fever and leukocytosis, overnight agitation. So far TF on hold, I have increased IVF and continued on FW and po meds. I have discussed with Dr. Ander Slade CCM.     This patient is critically ill due to worsening respiratory status, respiratory failure needing BIPAP and likely re-intubation, sepsis, spetic shock, leukocytosis, hypotension and at significant risk of neurological worsening, death form sepsis, heart failure, respiratory failure. This patient's care requires constant monitoring of vital signs, hemodynamics, respiratory and cardiac monitoring, review of multiple databases, neurological assessment, discussion with family, other specialists and medical decision making of high complexity. I spent 35  minutes of neurocritical care  time in the care of this patient.  Rosalin Hawking, MD PhD Stroke Neurology 10/22/2019 12:05 PM  To contact Stroke Continuity provider, please refer to [email or web address] After hours, contact General Neurology

## 2019-10-22 NOTE — Progress Notes (Signed)
Pt very restless with frequent forceful coughing on ventilator throughout the night despite PRN fentanyl pushes. At ~0315 went to assess ventilator alarm, found brown liquid coming out of pt's trach and running down his back, also noted coming from nose. TF turned off and PEG placed to ILWS per Elink MD. Fluid removed from PEG consistent with fluid found on pt. Stat ABG and CXR ordered, however no desaturations noted during event.  Following episode, pt lethargic. Temp 103.3, up from 102.9. Tylenol/ice packs administered.

## 2019-10-22 NOTE — Progress Notes (Signed)
NAME:  Riley Wagner, MRN:  [id number], DOB:  Jun 07, 1955, LOS: 43 ADMISSION DATE:  10/02/2019, CONSULTATION DATE:  10/02/2019 REFERRING MD:  Dr. Debbrah Alar, neuro IR, CHIEF COMPLAINT:  Altered mental status   Brief History   64 y/o male former smoker brought to ER with hypertensive crisis (BP 212/110), Rt sided weakness and aphasia.  Found to have Lt terminal ICA occlusion.  Intubated for airway protection. Noted to have intraparenchymal bleed along posterior limb of IC as well as Rt sided bleed.  Had embolization of Lt ICA terminus.  Past Medical History  HTN, [place] [facility] Events   5/16 Admit. To IR for embolization of Left ICA. Back to ICU intubated. BP goal 120-140 5/17 still hypertensive in spite of maximum dose Cleviprex.  Continues to exhibit right-sided hemiparesis 5/18 CT brain showing worsening midline shift and cerebral edema.  Still having difficulty with blood pressure control, requiring titration up of oral regimen.  Of note was made DO NOT RESUSCITATE by family on 5/17 5/20 bp under control. Moving all ext except RUE. Family; leaning towards attempt at extubation and trach if fails.  5/21 Extubated  5/28 Planning for placement of PEG today  6/02 Increased respiratory distress yesterday. Brought to ICU and intubated.  Hypotensive overnight.  6/5 aspiration event  Consults:  Neuro IR  Procedures:  ETT 5/16 >> 5/21 Radial aline 5/16 >>  Significant Diagnostic Tests:   UDS 5/16 >> positive for cocaine, THC  CT head 5/16 >> acute cytotoxic edema in Lt insula and frontal operculum, extensive chronic small vessel ischemic changes  Echo 5/17 >> Left Ventricle: Left ventricular ejection fraction, by estimation, is 55 to 60%. The left ventricle has normal function. The left ventricle has no regional wall motion abnormalities. The left ventricular internal cavity size was normal in size. There is mild concentric left ventricular hypertrophy. Left ventricular diastolic  parameters are consistent with Grade II diastolic dysfunction (pseudonormalization). Elevated left ventricular end-diastolic pressure.  CT brain 5/18 >> small acute infarct in the right parietal cortex, no new hemorrhage, left MCA infarct with progressive cytotoxic edema/swelling in 6 mm midline shif  CT brain 5/21 >> Left more than right cerebral infarcts without progression from brain MRI 2 days ago. Cytotoxic edema causes 1 cm of rightward shift.2. No interval hemorrhage.  CT brain 6/1 >> unchanged. Note paranasal sinus disease, mucosal thickening/frothy secretions in right maxillary sinus  Micro Data:  SARS CoV2 PCR 5/16 >> negative MRSA PCR 5/16 > negative Respiratory culture 5/18 >> nml flora  Blood culture 5/22 >> negative Tracheal aspirte 5/29 >> normal flora  BCx2 5/29 >> negative  UA 6/1 >> negative  Blood culture 6x4-no growth so far  Antimicrobials:  Unasyn 5/22 > 5/29 Cefepime 5/29 >>  Interim history/subjective:  Febrile On pressure support  Objective   Blood pressure 130/87, pulse 97, temperature 99.6 F (37.6 C), temperature source Axillary, resp. rate (!) 26, height 5\' 7"  (1.702 m), weight 72.7 kg, SpO2 100 %.    Vent Mode: PSV;CPAP FiO2 (%):  [40 %] 40 % Set Rate:  [16 bmp] 16 bmp Vt Set:  [520 mL] 520 mL PEEP:  [5 cmH20] 5 cmH20 Pressure Support:  [10 JJO84-16 cmH20] 10 cmH20 Plateau Pressure:  [17 cmH20-18 cmH20] 18 cmH20   Intake/Output Summary (Last 24 hours) at 10/22/2019 1059 Last data filed at 10/22/2019 1000 Gross per 24 hour  Intake 3086.75 ml  Output 1700 ml  Net 1386.75 ml   Filed Weights   10/19/19  2030 10/20/19 0500 10/21/19 0500  Weight: 74.5 kg 73.7 kg 72.7 kg    Examination: General: Chronically ill-appearing, appears comfortable HEENT: MM pink/moist, trach midline c/d/i Neuro: Spontaneous movement on the left, does not move right CV: S1-S2 appreciated PULM: Clear breath sounds, on pressure support GI: soft, bsx4 active, PEG tube  in place, tolerating TF   Chest x-ray 10/22/2019 shows no infiltrative process  Resolved Hospital Problem list   Non-anion anion gap metabolic acidosis Acute hypoxic respiratory insufficiency with compromised airway-Extubated 5/21 Hypertensive emergency Therapeutic hypernatremia, mild hyperchloremic  Aspiration PNA   Assessment & Plan:   L MCA Infarcts due to left M1 occlusion s/p unsuccessful L MCA thrombectomy  L MCA infarct likely secondary to L ICA large vessel disease.  Right MCA infarcts in setting of cocaine use and right ICA and MCA large vessel stenoses    Cerebral edema  MRI brain showed evolving bilateral cerebral infarcts, large left MCA infarct, unchanged cytotoxic edema with 8 mm of rightward midline shift.  There is negative flow in the intracranial left ICA left MCA or left A1 segment -per Neurology  -prognosis for meaningful functional recovery guarded, family wants more time to see if he will recover  -PT efforts  -right hand brace -limit all sedating medications -LCB - no CPR / ACLS  -suspect he will need LTAC rehab   Acute Respiratory Failure due to inability to protect airway.  Recurrent Aspiration, LLL Airspace Disease / ?Effusion -There is improvement in chest x-ray findings with the left lower lobe airspace infiltrate improving Had aspiration PNA, completed abx.  S/P trach 6/4.-trach care per protocol  -PRVC 8cc/kg as rest mode  -PSV wean as tolerated  -follow intermittent CXR -plan for 7 days total cefepime for possible aspiration -No significant collection of pleural fluid on left  Fever Potential for multiple sources > sinus disease on CT, neuro from CVA / edema, aspiration PNA / effusion on left, diarrhea, drug fever  -plan to stop abx on 6/5 for total of 14 days abx -monitor fever curve / WBC trend  -We will stop antibiotics after today's doses and monitor off antibiotics -Follow-up on cultures  HX of hypertension  Hx of HLD. -Oral medications  optimized by neurology  CKD 2. Hypernatremia  Baseline sr cr ~ 1.2. Noted rise of BUN/Cr 6/4, thought related to hypotension from 6/2-6/3 -increase free water to 235ml Q4 -Trend BMP / urinary output -Replace electrolytes as indicated -Avoid nephrotoxic agents, ensure adequate renal perfusion -Sodium is still 150, add D5 water 500 cc -Continue free water  Hyperglycemia  -monitor trend, goal 140-180  Diarrhea -stop scheduled motility agents / softeners  -monitor volume   Daily Goals Checklist  Pain/Anxiety/Delirium protocol (if indicated): fentanyl, enteral oxycodone VAP protocol (if indicated): bundle in place. DVT prophylaxis: UFH Nutrition Status: TF GI prophylaxis: PPI  Glucose control: adequate control on current regimen Code Status: DNR Family Communication:  Disposition: ICU   Labs   CBC: Recent Labs  Lab 10/18/19 1436 10/18/19 1436 10/19/19 0511 10/19/19 0511 10/20/19 0004 10/20/19 0867 10/21/19 0709 10/22/19 0353 10/22/19 0446  WBC 17.0*  --  22.0*  --   --  19.6* 21.6*  --  28.5*  NEUTROABS 12.9*  --   --   --   --   --   --   --   --   HGB 13.3   < > 15.1   < > 13.9 12.3* 11.1* 11.6* 11.9*  HCT 41.1   < > 45.6   < >  41.0 38.4* 34.9* 34.0* 36.6*  MCV 93.6  --  93.4  --   --  94.8 94.6  --  94.6  PLT 676*  --  688*  --   --  632* 637*  --  682*   < > = values in this interval not displayed.    Basic Metabolic Panel: Recent Labs  Lab 10/18/19 0705 10/18/19 0705 10/19/19 0511 10/19/19 0511 10/20/19 0004 10/20/19 0177 10/21/19 0709 10/22/19 0353 10/22/19 0446  NA 149*   < > 146*   < > 151* 150* 149* 152* 150*  K 3.4*   < > 4.9   < > 3.9 4.3 3.8 3.8 4.8  CL 118*  --  116*  --   --  121* 120*  --  120*  CO2 20*  --  18*  --   --  18* 18*  --  19*  GLUCOSE 141*  --  167*  --   --  204* 192*  --  146*  BUN 35*  --  35*  --   --  70* 51*  --  40*  CREATININE 1.36*  --  1.38*  --   --  2.65* 1.58*  --  1.42*  CALCIUM 8.8*  --  9.1  --   --  8.5*  8.1*  --  8.0*   < > = values in this interval not displayed.   GFR: Estimated Creatinine Clearance: 48.5 mL/min (A) (by C-G formula based on SCr of 1.42 mg/dL (H)). Recent Labs  Lab 10/19/19 0511 10/20/19 0638 10/21/19 0709 10/22/19 0446  WBC 22.0* 19.6* 21.6* 28.5*    Liver Function Tests: Recent Labs  Lab 10/17/19 0937 10/18/19 0705 10/19/19 0511 10/22/19 0446  AST 128* 163* 169* 115*  ALT 282* 327* 414* 236*  ALKPHOS 106 112 135* 126  BILITOT 0.5 0.4 0.7 0.9  PROT 6.8 6.5 7.4 5.8*  ALBUMIN 2.3* 2.1* 2.4* 1.7*   No results for input(s): LIPASE, AMYLASE in the last 168 hours. No results for input(s): AMMONIA in the last 168 hours.  ABG    Component Value Date/Time   PHART 7.473 (H) 10/22/2019 0353   PCO2ART 25.4 (L) 10/22/2019 0353   PO2ART 81 (L) 10/22/2019 0353   HCO3 18.7 (L) 10/22/2019 0353   TCO2 19 (L) 10/22/2019 0353   ACIDBASEDEF 4.0 (H) 10/22/2019 0353   O2SAT 97.0 10/22/2019 0353     Coagulation Profile: Recent Labs  Lab 10/20/19 1019  INR 1.4*    Cardiac Enzymes: No results for input(s): CKTOTAL, CKMB, CKMBINDEX, TROPONINI in the last 168 hours.  HbA1C: Hgb A1c MFr Bld  Date/Time Value Ref Range Status  10/03/2019 02:15 AM 5.9 (H) 4.8 - 5.6 % Final    Comment:    (NOTE)         Prediabetes: 5.7 - 6.4         Diabetes: >6.4         Glycemic control for adults with diabetes: <7.0     CBG: Recent Labs  Lab 10/21/19 1531 10/21/19 1917 10/21/19 2326 10/22/19 0325 10/22/19 0805  GLUCAP 170* 120* 159* 159* 131*    The patient is critically ill with multiple organ systems failure and requires high complexity decision making for assessment and support, frequent evaluation and titration of therapies, application of advanced monitoring technologies and extensive interpretation of multiple databases. Critical Care Time devoted to patient care services described in this note independent of APP/resident time (if applicable)  is 32  minutes.    Sherrilyn Rist MD [facility] Personal pager: [phone] If unanswered, please page CCM On-call: [phone]

## 2019-10-23 LAB — BASIC METABOLIC PANEL
Anion gap: 9 (ref 5–15)
BUN: 38 mg/dL — ABNORMAL HIGH (ref 8–23)
CO2: 21 mmol/L — ABNORMAL LOW (ref 22–32)
Calcium: 7.9 mg/dL — ABNORMAL LOW (ref 8.9–10.3)
Chloride: 118 mmol/L — ABNORMAL HIGH (ref 98–111)
Creatinine, Ser: 1.33 mg/dL — ABNORMAL HIGH (ref 0.61–1.24)
GFR calc Af Amer: >60 mL/min (ref >60)
GFR calc non Af Amer: 56 mL/min — ABNORMAL LOW (ref >60)
Glucose, Bld: 124 mg/dL — ABNORMAL HIGH (ref 70–99)
Potassium: 5.1 mmol/L (ref 3.5–5.1)
Sodium: 148 mmol/L — ABNORMAL HIGH (ref 135–145)

## 2019-10-23 LAB — BLOOD CULTURE ID PANEL (REFLEXED)

## 2019-10-23 LAB — GLUCOSE, CAPILLARY
Glucose-Capillary: 115 mg/dL — ABNORMAL HIGH (ref 70–99)
Glucose-Capillary: 121 mg/dL — ABNORMAL HIGH (ref 70–99)
Glucose-Capillary: 139 mg/dL — ABNORMAL HIGH (ref 70–99)
Glucose-Capillary: 99 mg/dL (ref 70–99)
Glucose-Capillary: 105 mg/dL — ABNORMAL HIGH (ref 70–99)
Glucose-Capillary: 106 mg/dL — ABNORMAL HIGH (ref 70–99)

## 2019-10-23 LAB — CBC
HCT: 35.5 % — ABNORMAL LOW (ref 39.0–52.0)
Hemoglobin: 11.6 g/dL — ABNORMAL LOW (ref 13.0–17.0)
MCH: 31 pg (ref 26.0–34.0)
MCHC: 32.7 g/dL (ref 30.0–36.0)
MCV: 94.9 fL (ref 80.0–100.0)
Platelets: 591 10*3/uL — ABNORMAL HIGH (ref 150–400)
RBC: 3.74 MIL/uL — ABNORMAL LOW (ref 4.22–5.81)
RDW: 15.3 % (ref 11.5–15.5)
WBC: 20.6 10*3/uL — ABNORMAL HIGH (ref 4.0–10.5)
nRBC: 0 % (ref 0.0–0.2)

## 2019-10-23 MED ORDER — DEXTROSE 5 % IV SOLN: INTRAVENOUS | Status: AC

## 2019-10-23 MED ORDER — SODIUM CHLORIDE 0.9 % IV BOLUS
250.0000 mL | Freq: Once | INTRAVENOUS | Status: AC
  Administered 2019-10-23: 250 mL via INTRAVENOUS

## 2019-10-23 MED ORDER — PANTOPRAZOLE SODIUM 40 MG PO PACK
40.0000 mg | PACK | Freq: Every day | ORAL | Status: DC
  Administered 2019-10-23 – 2019-10-26 (×4): 40 mg
  Filled 2019-10-23 (×4): qty 20

## 2019-10-23 NOTE — Progress Notes (Signed)
RT note: attempted patient on CPAP/PSV of 5/5.  Patient was tolerating well however after about 6 minutes, patient began to use abdominal muscles to breath and looked uncomfortable.  Placed patient back on full support ventilation and is more comfortable.  Will attempt again if able.  Will continue to monitor.

## 2019-10-23 NOTE — Progress Notes (Addendum)
**Note Riley-Identified via Obfuscation** STROKE TEAM PROGRESS NOTE   INTERVAL HISTORY  Patient remains on ventilatory support on tracheostomy.  He is barely arousable opens eyes but does not follow any commands.  Is afebrile today but T-max was 101.6 and is on cefepime antibiotic since 10/15/2019 White count is improved to 20.6   Vitals:   10/23/19 0530 10/23/19 0600 10/23/19 0630 10/23/19 0700  BP: 117/85 107/65 (!) 104/54 (!) 89/51  Pulse: 93 95 85 87  Resp: (!) 22 20 (!) 23 16  Temp:      TempSrc:      SpO2: 100% 100% 100% 100%  Weight:      Height:       CBC:  Recent Labs  Lab 10/18/19 1436 10/19/19 0511 10/22/19 0446 10/23/19 0425  WBC 17.0*   < > 28.5* 20.6*  NEUTROABS 12.9*  --   --   --   HGB 13.3   < > 11.9* 11.6*  HCT 41.1   < > 36.6* 35.5*  MCV 93.6   < > 94.6 94.9  PLT 676*   < > 682* 591*   < > = values in this interval not displayed.   Basic Metabolic Panel:  Recent Labs  Lab 10/22/19 0446 10/23/19 0425  NA 150* 148*  K 4.8 5.1  CL 120* 118*  CO2 19* 21*  GLUCOSE 146* 124*  BUN 40* 38*  CREATININE 1.42* 1.33*  CALCIUM 8.0* 7.9*   IMAGING past 24 hours  No results found.  PHYSICAL EXAM    Temp:  [99.6 F (37.6 C)-101.6 F (38.7 C)] 99.7 F (37.6 C) (06/06 0400) Pulse Rate:  [79-102] 87 (06/06 0700) Resp:  [16-28] 16 (06/06 0700) BP: (89-138)/(51-90) 89/51 (06/06 0700) SpO2:  [98 %-100 %] 100 % (06/06 0700) FiO2 (%):  [40 %] 40 % (06/06 0351) Weight:  [77.8 kg] 77.8 kg (06/06 0500)  General - Well nourished, well developed middle-aged African-American male s/p tracheostomy, on ventilator off precedex  Ophthalmologic - fundi not visualized due to noncooperation.  Cardiovascular - Regular rate and rhythm.  Neuro - on trach off precedex, eyes open, globally aphasic not following commands. Eyes in left gaze position, not blinking to visual threat on the right, doll's eyes present, not tracking in right visual field but tracking on the left, PERRL. Corneal reflex present, gag and  cough present. Facial symmetry not able to test due to ET tube.  Tongue protrusion not cooperative. On pain stimulation, LUE at least 4/5 and LLE 2+/5, but no movement on the RUE and slight withdraw at RLE, increased muscle tone on the right. Sensation, coordination and gait not tested.     ASSESSMENT/PLAN [name] with history of HTN who fell the night prior to admission, presenting with right sided weakness and aphasia. Taken to IR for terminus Left ICA occlusion.  Stroke:   L MCA infarcts due to left M1 occlusion s/p unsuccessful L MCA thrombectomy - L MCA infarct likely secondary to L ICA large vessel disease.  Stroke:   Right MCA infarcts in setting of cocaine use and right ICA and MCA large vessel stenoses     Code Stroke CT head cytotoxic edema L insula and frontal operculum. Likely old L parietal cortical and subcortical abnormality. ASPECTS 8    CTA head & neck L ICA occlusion. Widespread atherosclerosis: B CCA to ICA bifurcation R 40%, L 50% w/ extensive soft plaques at bifurcation and bulbs, L 30-40%. Severe B ICA siphon stenoses. R  MCA and B PCA atherosclerosis w/ narrowing & irregularity.  CT perfusion 105 penumbra L MCA territory.   Cerebral angio - 10/02/19 - Riley Rosario [name], MD - diffuse and severe intracranial atherosclerosis. Proximal L M1 occlusion. Complete recanalization L M1 w/ embotrap w/ poor distal flow d/t severe L ICA stenosis s/p angioplasty. L ICA terminus coil embolization w/ sparing L anterior choroidal.  CT head repeat 5/16 large L basal ganglia hemorrhage/contrast posterior SDH and SAH. Large cytotoxic edema L MCA w/ 45mm midline shift.   CT head 5/17 diminishing SAH contrast. L MCA infarct w/o significant mass effect  CT head 5/18 diminishing SAH. L MCA infarct increased edema, MLS 2mm. Right parietal small new infarct.   MRI evolving B cerebral infarcts including large L MCA infarct w/ edema and 80mm R midline shift,  right parietal and frontal infarcts. No significant hemorrhage.  MRA No flow L carotid systeme following thrombectomy. Advanced intracranial atherosclerosis including moderate R ICA, mild R M1, severe proximal R M2 and severe L V4 stenoses.    CT repeat 5/21 L>R infarct w/o progression. 1cm R midline shift. No hemorrhage   CT repeat 5/23 - Continued interval evolution of left greater than right ischemic infarcts with unchanged 1cm of left-to-right shift.   CT repeat 6/1 - continued interval evolution of left large infarct, MLS at 35mm  2D Echo EF 55-60%   TG 471 and direct LDL 95.3, goal < 70  HgbA1c 5.9   UDS + cocaine and THC  lovenox 40 for VTE prophylaxis  No antithrombotic prior to admission, now on ASA 325mg  and plavix for B ICA severe stenoses. Continue DAPT x 3 months then aspirin alone.   Therapy recommendations:  SNF   Disposition: pending   Cerebral Edema, resolving  Present on admission scan  Repeat CT head 5/17 large left MCA infarct with edema but no significant midline shift  CT 5/18 repeat L MCA infarct increased edema, MLS 107mm. Right parietal small new infarct.  MRI 5/19 - large L MCA infarct with MLS 19mm  CT 5/21 - stable b/l infarct with MLS 1cm  CT 5/23 - unchanged 10 mm of left-to-right shift.   CT 6/1 - 57mm left to right MLS  Na 149-146-151-150-148  Acute hypoxic respiratory failure with compromised airway  Intubated for IR, continued d/t compromised airway  CXR 5/21 - increased left lung base consolidation/atelectasis. pulm vasc congestion  CXR 6/1 - Low volume chest with atelectasis or mild infiltrate at the left base.  Extubated 5/21  Still has difficulty with oral secretions  On CPT, neb, NT suction -> developed acute respiratory failure -> BiPAP -> Reintubated 6/2 ->Trach 6/3  Sepsis, septic shock Fever  leukocytosis Pneumonia due to aspiration  Tmax 100.8->100.5->100.6->101.6->102.9->103.3->99.7 - cooling  blanket  Leukocytosis - 16.8->17.0->22.0->19.6->21.6->28.5->20.6  UA - 10/18/19 neg, repeat 10/21/19 neg  CXR 6/1 - Low volume chest with atelectasis or mild infiltrate at the left base.  CXR 6/2 - Hazy airspace opacity at the left lung base which could be due to atelectasis and/or infectious etiology.  Sputum culture 5/18 normal respiratory flora  PICC out d/t temp. Has PIV  Blood culture 5/29  NGTD - repeat culture 10/21/19 - Staph - Methicillin resistance detected - critical result called to pharmacy - Sherlon Handing, PharmD, BCPS - continue to follow cultures - MD notified  Resp Cx 6/5 - ABUNDANT GRAM NEGATIVE RODS ; MODERATE GRAM POSITIVE COCCI IN CHAINS IN CLUSTERS ; RARE GRAM POSITIVE RODS - final pending  E-Link recommends Cdiff evaluation  Tylenol / motrin prn   Unasyn - 5/22>>5/29  Cefepime 5/29>> 6/4  VANC 5/29>>5/31  Currently on no antibiotics  CCM on board now  Hypertensive Emergency Hypotension, septic shock and medication effect  SBP > 220 on arrival  Home meds:  Amlodipine 10, coreg 3.125 bid, HCTZ 25, lisinopril 40, holding at this time due to hypotension, has labetalol prn  Off cleviprex now  On amlodipine 10, clonidine 0.1 tid, hydralazine 100 q8, labetalol 300 Q8, lisinopril 40, hygroton 25 - all anti-htn meds d/c'ed 5/29 for significant hypotension  . Hypotensive today - 104/54 -> 89/51 . SBP goal < 180  . Long-term BP goal normotensive . NS IV at 75 cc / hr ->  Will give 250 cc NS bolus  Hyperlipidemia  Home meds:  lipitor 40, resumed in hospital  Direct LDL 95.3, goal < 70  On lipitor 40 - d/c'ed 10/18/19 due to elevated LFTs  Consider statin at discharge  Dysphagia . Secondary to stroke . NPO . PEG placement 5/28 -> on tube feedings -> now on hold due to aspiration 6/5 . on free water 200 q4h  . IVF @ 50 -> 75 . Speech on board  CKD stage III  Cre 1.39->1.33->1.36->1.38->2.65->1.58->1.42->1.33  Likely due to hypotension and  sepsis  CCM on board  On IVF and FW  Close monitoring  Elevated LFTs, improving  AST/ALT -128/282 -> 163/327->169/414 ->115/236  Hepatitis panel neg  Likely related to infection and sepsis  Statin discontinued for now  Cocaine abuse  UDS positive for cocaine  Cessation education will be provided   Other Stroke Risk Factors  Advanced age  Former Cigarette smoker, quit 5 yrs ago  ETOH use, advised to drink no more than 2 drink(s) a day  THC abuse - UDS:  THC POSITIVE, cessation education will be provided.  Family hx stroke (mother)  Coronary artery disease, MI  Other Active Problems  Left eye conjunctivitis and subconjunctival hemorrhage - Ciprofloxacin drops started 5/21>>5/26 - improved  Constipation - Dulcolax sppositories  Hyperglycemia -> SSI  Lethargy. Amantadine 100 bid added 10/13/19  Hypokalemia - 3.4 ->4.9->4.3->5.[facility] day # 21   Patient remains critically ill due to respiratory failure requiring ventilatory support despite tracheostomy.  Continue weaning of ventilatory support and antibiotics for infection as per critical care team.    No family available for discussion at the bedside.  Discussed with Dr. Ander Slade critical care medicine. This patient is critically ill and at significant risk of neurological worsening, death and care requires constant monitoring of vital signs, hemodynamics,respiratory and cardiac monitoring, extensive review of multiple databases, frequent neurological assessment, discussion with family, other specialists and medical decision making of high complexity.I have made any additions or clarifications directly to the above note.This critical care time does not reflect procedure time, or teaching time or supervisory time of PA/NP/Med Resident etc but could involve care discussion time.  I spent 30 minutes of neurocritical care time  in the care of  this patient.    Antony Contras, MD Medical Director [facility] Stroke  Center Pager: [phone] 10/23/2019 2:08 PM To contact Stroke Continuity provider, please refer to [email or web address] After hours, contact General Neurology

## 2019-10-23 NOTE — Progress Notes (Signed)
NAME:  Riley Wagner, MRN:  [id number], DOB:  23-Sep-1955, LOS: 21 ADMISSION DATE:  10/02/2019, CONSULTATION DATE:  10/02/2019 REFERRING MD:  Dr. Debbrah Alar, neuro IR, CHIEF COMPLAINT:  Altered mental status   Brief History   64 y/o male former smoker brought to ER with hypertensive crisis (BP 212/110), Rt sided weakness and aphasia.  Found to have Lt terminal ICA occlusion.  Intubated for airway protection. Noted to have intraparenchymal bleed along posterior limb of IC as well as Rt sided bleed.  Had embolization of Lt ICA terminus.  Past Medical History  HTN, [place] [facility] Events   5/16 Admit. To IR for embolization of Left ICA. Back to ICU intubated. BP goal 120-140 5/17 still hypertensive in spite of maximum dose Cleviprex.  Continues to exhibit right-sided hemiparesis 5/18 CT brain showing worsening midline shift and cerebral edema.  Still having difficulty with blood pressure control, requiring titration up of oral regimen.  Of note was made DO NOT RESUSCITATE by family on 5/17 5/20 bp under control. Moving all ext except RUE. Family; leaning towards attempt at extubation and trach if fails.  5/21 Extubated  5/28 Planning for placement of PEG today  6/02 Increased respiratory distress yesterday. Brought to ICU and intubated.  Hypotensive overnight.  6/5 aspiration event  Consults:  Neuro IR  Procedures:  ETT 5/16 >> 5/21 Radial aline 5/16 >>  Significant Diagnostic Tests:   UDS 5/16 >> positive for cocaine, THC  CT head 5/16 >> acute cytotoxic edema in Lt insula and frontal operculum, extensive chronic small vessel ischemic changes  Echo 5/17 >> Left Ventricle: Left ventricular ejection fraction, by estimation, is 55 to 60%. The left ventricle has normal function. The left ventricle has no regional wall motion abnormalities. The left ventricular internal cavity size was normal in size. There is mild concentric left ventricular hypertrophy. Left ventricular diastolic  parameters are consistent with Grade II diastolic dysfunction (pseudonormalization). Elevated left ventricular end-diastolic pressure.  CT brain 5/18 >> small acute infarct in the right parietal cortex, no new hemorrhage, left MCA infarct with progressive cytotoxic edema/swelling in 6 mm midline shif  CT brain 5/21 >> Left more than right cerebral infarcts without progression from brain MRI 2 days ago. Cytotoxic edema causes 1 cm of rightward shift.2. No interval hemorrhage.  CT brain 6/1 >> unchanged. Note paranasal sinus disease, mucosal thickening/frothy secretions in right maxillary sinus  Micro Data:  SARS CoV2 PCR 5/16 >> negative MRSA PCR 5/16 > negative Respiratory culture 5/18 >> nml flora  Blood culture 5/22 >> negative Tracheal aspirte 5/29 >> normal flora  BCx2 5/29 >> negative  UA 6/1 >> negative  Blood culture 6x4--1 out of 4, likely contaminant  Antimicrobials:  Unasyn 5/22 > 5/29 Cefepime 5/29 >>  Interim history/subjective:  Febrile Failed weaning this morning after 6 minutes of pressure support  Objective   Blood pressure 101/70, pulse 95, temperature 99.7 F (37.6 C), temperature source Axillary, resp. rate (!) 24, height 5\' 7"  (1.702 m), weight 77.8 kg, SpO2 100 %.    Vent Mode: PRVC FiO2 (%):  [40 %] 40 % Set Rate:  [16 bmp] 16 bmp Vt Set:  [520 mL] 520 mL PEEP:  [5 cmH20] 5 cmH20 Pressure Support:  [10 cmH20] 10 cmH20 Plateau Pressure:  [14 cmH20-18 cmH20] 14 cmH20   Intake/Output Summary (Last 24 hours) at 10/23/2019 0837 Last data filed at 10/23/2019 0700 Gross per 24 hour  Intake 2290.56 ml  Output 1800 ml  Net 490.56  ml   Filed Weights   10/20/19 0500 10/21/19 0500 10/23/19 0500  Weight: 73.7 kg 72.7 kg 77.8 kg    Examination: General: Chronically ill-appearing, comfortable HEENT: Moist oral mucosa, tracheostomy midline Neuro: Spontaneous movement on the left, does not move right CV: S1-S2 appreciated PULM: Clear breath sounds GI: soft,  bsx4 active, PEG tube in place, tolerating TF   Chest x-ray 10/22/2019 shows no infiltrative process-reviewed by myself Sodium 148 BUN 38, creatinine 1.33 WBC 20.6-improved from 28.5   Resolved Hospital Problem list   Non-anion anion gap metabolic acidosis Acute hypoxic respiratory insufficiency with compromised airway-Extubated 5/21 Hypertensive emergency Therapeutic hypernatremia, mild hyperchloremic  Aspiration PNA  T-max 101.6  Assessment & Plan:   L MCA Infarcts due to left M1 occlusion s/p unsuccessful L MCA thrombectomy  L MCA infarct likely secondary to L ICA large vessel disease.  Right MCA infarcts in setting of cocaine use and right ICA and MCA large vessel stenoses    Cerebral edema  MRI brain showed evolving bilateral cerebral infarcts, large left MCA infarct, unchanged cytotoxic edema with 8 mm of rightward midline shift.  There is negative flow in the intracranial left ICA left MCA or left A1 segment -per Neurology  -prognosis for meaningful functional recovery guarded, family wants more time to see if he will recover  -PT efforts  -right hand brace -limit all sedating medications -LCB - no CPR / ACLS  -Will likely need LTAC rehab  Acute Respiratory Failure due to inability to protect airway.  Recurrent Aspiration, LLL Airspace Disease / ?Effusion -There is improvement in chest x-ray findings with the left lower lobe airspace infiltrate improving Had aspiration PNA, completed abx.  S/P trach 6/4.-trach care per protocol  -PRVC 8cc/kg as rest mode  -PSV wean as tolerated  -follow intermittent CXR -Patient completed cefepime -No significant pleural fluid collection on the left, chest x-ray improvement  Fever Potential for multiple sources > sinus disease on CT, neuro from CVA / edema, aspiration PNA / effusion on left, diarrhea, drug fever  -Antibiotics completed at 14 days on 6/5 -monitor fever curve / WBC trend  -We will stop antibiotics after today's doses  and monitor off antibiotics -Follow-up on cultures-1 out of 4 -Follow respiratory cultures  HX of hypertension  Hx of HLD. -Oral medications optimized by neurology  CKD 2. Hypernatremia-improving Baseline sr cr ~ 1.2. Noted rise of BUN/Cr 6/4, thought related to hypotension from 6/2-6/3 -increased free water to 214ml Q4 -Trend BMP / urinary output -Replace electrolytes as indicated -Avoid nephrotoxic agents, ensure adequate renal perfusion -Continue free water  Hyperglycemia  -monitor trend, goal 140-180  Diarrhea -stop scheduled motility agents / softeners  -monitor volume    Labs ordered for a.m., ABG ordered for a.m.  Daily Goals Checklist  Pain/Anxiety/Delirium protocol (if indicated): fentanyl, enteral oxycodone VAP protocol (if indicated): bundle in place. DVT prophylaxis: UFH Nutrition Status: TF GI prophylaxis: PPI  Glucose control: adequate control on current regimen Code Status: DNR Family Communication:  Disposition: ICU   Labs   CBC: Recent Labs  Lab 10/18/19 1436 10/18/19 1436 10/19/19 0511 10/20/19 0004 10/20/19 6045 10/21/19 0709 10/22/19 0353 10/22/19 0446 10/23/19 0425  WBC 17.0*   < > 22.0*  --  19.6* 21.6*  --  28.5* 20.6*  NEUTROABS 12.9*  --   --   --   --   --   --   --   --   HGB 13.3   < > 15.1   < >  12.3* 11.1* 11.6* 11.9* 11.6*  HCT 41.1   < > 45.6   < > 38.4* 34.9* 34.0* 36.6* 35.5*  MCV 93.6   < > 93.4  --  94.8 94.6  --  94.6 94.9  PLT 676*   < > 688*  --  632* 637*  --  682* 591*   < > = values in this interval not displayed.    Basic Metabolic Panel: Recent Labs  Lab 10/19/19 0511 10/20/19 0004 10/20/19 0321 10/21/19 0709 10/22/19 0353 10/22/19 0446 10/23/19 0425  NA 146*   < > 150* 149* 152* 150* 148*  K 4.9   < > 4.3 3.8 3.8 4.8 5.1  CL 116*  --  121* 120*  --  120* 118*  CO2 18*  --  18* 18*  --  19* 21*  GLUCOSE 167*  --  204* 192*  --  146* 124*  BUN 35*  --  70* 51*  --  40* 38*  CREATININE 1.38*  --   2.65* 1.58*  --  1.42* 1.33*  CALCIUM 9.1  --  8.5* 8.1*  --  8.0* 7.9*   < > = values in this interval not displayed.   GFR: Estimated Creatinine Clearance: 51.8 mL/min (A) (by C-G formula based on SCr of 1.33 mg/dL (H)). Recent Labs  Lab 10/20/19 0638 10/21/19 0709 10/22/19 0446 10/23/19 0425  WBC 19.6* 21.6* 28.5* 20.6*    Liver Function Tests: Recent Labs  Lab 10/17/19 0937 10/18/19 0705 10/19/19 0511 10/22/19 0446  AST 128* 163* 169* 115*  ALT 282* 327* 414* 236*  ALKPHOS 106 112 135* 126  BILITOT 0.5 0.4 0.7 0.9  PROT 6.8 6.5 7.4 5.8*  ALBUMIN 2.3* 2.1* 2.4* 1.7*   No results for input(s): LIPASE, AMYLASE in the last 168 hours. No results for input(s): AMMONIA in the last 168 hours.  ABG    Component Value Date/Time   PHART 7.473 (H) 10/22/2019 0353   PCO2ART 25.4 (L) 10/22/2019 0353   PO2ART 81 (L) 10/22/2019 0353   HCO3 18.7 (L) 10/22/2019 0353   TCO2 19 (L) 10/22/2019 0353   ACIDBASEDEF 4.0 (H) 10/22/2019 0353   O2SAT 97.0 10/22/2019 0353     Coagulation Profile: Recent Labs  Lab 10/20/19 1019  INR 1.4*    Cardiac Enzymes: No results for input(s): CKTOTAL, CKMB, CKMBINDEX, TROPONINI in the last 168 hours.  HbA1C: Hgb A1c MFr Bld  Date/Time Value Ref Range Status  10/03/2019 02:15 AM 5.9 (H) 4.8 - 5.6 % Final    Comment:    (NOTE)         Prediabetes: 5.7 - 6.4         Diabetes: >6.4         Glycemic control for adults with diabetes: <7.0     CBG: Recent Labs  Lab 10/22/19 1619 10/22/19 1926 10/22/19 2315 10/23/19 0309 10/23/19 0757  GLUCAP 137* 121* 118* 115* 121*    The patient is critically ill with multiple organ systems failure and requires high complexity decision making for assessment and support, frequent evaluation and titration of therapies, application of advanced monitoring technologies and extensive interpretation of multiple databases. Critical Care Time devoted to patient care services described in this note  independent of APP/resident time (if applicable)  is 33 minutes.   Sherrilyn Rist MD [facility] Personal pager: [phone] If unanswered, please page CCM On-call: [phone]

## 2019-10-23 NOTE — Progress Notes (Signed)
PHARMACY - PHYSICIAN COMMUNICATION CRITICAL VALUE ALERT - BLOOD CULTURE IDENTIFICATION (BCID)  Riley Wagner is an 64 y.o. male who presented to [facility] on 10/[facility]/2019 with a chief complaint of hypertensive crisis  Assessment:  1/4 bottles with staph species (mecA positive) - likely contaminant  Name of physician (or Provider) Contacted: Ogan  Current antibiotics: Pt just finished 14-day course of broad spectrum abx  Changes to prescribed antibiotics recommended:  No antibiotics to be added at this time. Will continue to f/u cultures.  Results for orders placed or performed during the hospital encounter of 10/02/19  Blood Culture ID Panel (Reflexed) (Collected: 10/21/2019  8:59 PM)  Result Value Ref Range   Enterococcus species NOT DETECTED NOT DETECTED   Listeria monocytogenes NOT DETECTED NOT DETECTED   Staphylococcus species DETECTED (A) NOT DETECTED   Staphylococcus aureus (BCID) NOT DETECTED NOT DETECTED   Methicillin resistance DETECTED (A) NOT DETECTED   Streptococcus species NOT DETECTED NOT DETECTED   Streptococcus agalactiae NOT DETECTED NOT DETECTED   Streptococcus pneumoniae NOT DETECTED NOT DETECTED   Streptococcus pyogenes NOT DETECTED NOT DETECTED   Acinetobacter baumannii NOT DETECTED NOT DETECTED   Enterobacteriaceae species NOT DETECTED NOT DETECTED   Enterobacter cloacae complex NOT DETECTED NOT DETECTED   Escherichia coli NOT DETECTED NOT DETECTED   Klebsiella oxytoca NOT DETECTED NOT DETECTED   Klebsiella pneumoniae NOT DETECTED NOT DETECTED   Proteus species NOT DETECTED NOT DETECTED   Serratia marcescens NOT DETECTED NOT DETECTED   Haemophilus influenzae NOT DETECTED NOT DETECTED   Neisseria meningitidis NOT DETECTED NOT DETECTED   Pseudomonas aeruginosa NOT DETECTED NOT DETECTED   Candida albicans NOT DETECTED NOT DETECTED   Candida glabrata NOT DETECTED NOT DETECTED   Candida krusei NOT DETECTED NOT DETECTED   Candida parapsilosis NOT DETECTED NOT  DETECTED   Candida tropicalis NOT DETECTED NOT DETECTED    Sherlon Handing, PharmD, BCPS Please see amion for complete clinical pharmacist phone list 10/23/2019  2:46 AM

## 2019-10-24 ENCOUNTER — Inpatient Hospital Stay (HOSPITAL_COMMUNITY): Payer: Medicare Other

## 2019-10-24 LAB — BASIC METABOLIC PANEL
Anion gap: 10 (ref 5–15)
BUN: 30 mg/dL — ABNORMAL HIGH (ref 8–23)
CO2: 19 mmol/L — ABNORMAL LOW (ref 22–32)
Calcium: 8.2 mg/dL — ABNORMAL LOW (ref 8.9–10.3)
Chloride: 119 mmol/L — ABNORMAL HIGH (ref 98–111)
Creatinine, Ser: 1.17 mg/dL (ref 0.61–1.24)
GFR calc Af Amer: >60 mL/min (ref >60)
GFR calc non Af Amer: >60 mL/min (ref >60)
Glucose, Bld: 120 mg/dL — ABNORMAL HIGH (ref 70–99)
Potassium: 3.7 mmol/L (ref 3.5–5.1)
Sodium: 148 mmol/L — ABNORMAL HIGH (ref 135–145)

## 2019-10-24 LAB — GLUCOSE, CAPILLARY
Glucose-Capillary: 109 mg/dL — ABNORMAL HIGH (ref 70–99)
Glucose-Capillary: 122 mg/dL — ABNORMAL HIGH (ref 70–99)
Glucose-Capillary: 137 mg/dL — ABNORMAL HIGH (ref 70–99)
Glucose-Capillary: 123 mg/dL — ABNORMAL HIGH (ref 70–99)
Glucose-Capillary: 139 mg/dL — ABNORMAL HIGH (ref 70–99)
Glucose-Capillary: 129 mg/dL — ABNORMAL HIGH (ref 70–99)

## 2019-10-24 LAB — POCT I-STAT 7, (LYTES, BLD GAS, ICA,H+H)
Acid-base deficit: 3 mmol/L — ABNORMAL HIGH (ref 0.0–2.0)
Bicarbonate: 20 mmol/L (ref 20.0–28.0)
Calcium, Ion: 1.27 mmol/L (ref 1.15–1.40)
HCT: 30 % — ABNORMAL LOW (ref 39.0–52.0)
Hemoglobin: 10.2 g/dL — ABNORMAL LOW (ref 13.0–17.0)
O2 Saturation: 99 %
Patient temperature: 99.8
Potassium: 3.4 mmol/L — ABNORMAL LOW (ref 3.5–5.1)
Sodium: 150 mmol/L — ABNORMAL HIGH (ref 135–145)
TCO2: 21 mmol/L — ABNORMAL LOW (ref 22–32)
pCO2 arterial: 27.8 mmHg — ABNORMAL LOW (ref 32.0–48.0)
pH, Arterial: 7.469 — ABNORMAL HIGH (ref 7.350–7.450)
pO2, Arterial: 138 mmHg — ABNORMAL HIGH (ref 83.0–108.0)

## 2019-10-24 LAB — CBC WITH DIFFERENTIAL/PLATELET
Abs Immature Granulocytes: 0.21 10*3/uL — ABNORMAL HIGH (ref 0.00–0.07)
Basophils Absolute: 0.1 10*3/uL (ref 0.0–0.1)
Basophils Relative: 0 %
Eosinophils Absolute: 0.4 10*3/uL (ref 0.0–0.5)
Eosinophils Relative: 2 %
HCT: 33 % — ABNORMAL LOW (ref 39.0–52.0)
Hemoglobin: 10.6 g/dL — ABNORMAL LOW (ref 13.0–17.0)
Immature Granulocytes: 1 %
Lymphocytes Relative: 9 %
Lymphs Abs: 1.6 10*3/uL (ref 0.7–4.0)
MCH: 30.3 pg (ref 26.0–34.0)
MCHC: 32.1 g/dL (ref 30.0–36.0)
MCV: 94.3 fL (ref 80.0–100.0)
Monocytes Absolute: 1.5 10*3/uL — ABNORMAL HIGH (ref 0.1–1.0)
Monocytes Relative: 8 %
Neutro Abs: 14.2 10*3/uL — ABNORMAL HIGH (ref 1.7–7.7)
Neutrophils Relative %: 80 %
Platelets: 551 10*3/uL — ABNORMAL HIGH (ref 150–400)
RBC: 3.5 MIL/uL — ABNORMAL LOW (ref 4.22–5.81)
RDW: 15.3 % (ref 11.5–15.5)
WBC: 17.9 10*3/uL — ABNORMAL HIGH (ref 4.0–10.5)
nRBC: 0.1 % (ref 0.0–0.2)

## 2019-10-24 LAB — CULTURE, RESPIRATORY W GRAM STAIN: Culture: NORMAL

## 2019-10-24 LAB — CULTURE, BLOOD (ROUTINE X 2): Special Requests: ADEQUATE

## 2019-10-24 NOTE — Progress Notes (Signed)
SLP Cancellation Note  Patient Details Name: Giovanie Lefebre MRN: [id number] DOB: [birth year]   Cancelled treatment:       Reason Eval/Treat Not Completed: Medical issues which prohibited therapy; pt on the vent. Will continue to follow.     Osie Bond., M.A. [facility] Pager [phone] Office [phone]  10/24/2019, 8:09 AM

## 2019-10-24 NOTE — Progress Notes (Signed)
NAME:  Riley Wagner, MRN:  [id number], DOB:  06-25-1955, LOS: 4 ADMISSION DATE:  10/02/2019, CONSULTATION DATE:  10/02/2019 REFERRING MD:  Dr. Debbrah Alar, neuro IR, CHIEF COMPLAINT:  Altered mental status   Brief History   64 y/o male former smoker brought to ER with hypertensive crisis (BP 212/110), Rt sided weakness and aphasia.  Found to have Lt terminal ICA occlusion.  Intubated for airway protection. Noted to have intraparenchymal bleed along posterior limb of IC as well as Rt sided bleed.  Had embolization of Lt ICA terminus.  Past Medical History  HTN, [place] [facility] Events   5/16 Admit. To IR for embolization of Left ICA. Back to ICU intubated. BP goal 120-140 5/17 still hypertensive in spite of maximum dose Cleviprex.  Continues to exhibit right-sided hemiparesis 5/18 CT brain showing worsening midline shift and cerebral edema.  Still having difficulty with blood pressure control, requiring titration up of oral regimen.  Of note was made DO NOT RESUSCITATE by family on 5/17 5/20 bp under control. Moving all ext except RUE. Family; leaning towards attempt at extubation and trach if fails.  5/21 Extubated  5/28 Planning for placement of PEG today  6/02 Increased respiratory distress yesterday. Brought to ICU and intubated.  Hypotensive overnight.  6/5 aspiration event  Consults:  Neuro IR  Procedures:  ETT 5/16 >> 5/21 Radial aline 5/16 >>  Significant Diagnostic Tests:   UDS 5/16 >> positive for cocaine, THC  CT head 5/16 >> acute cytotoxic edema in Lt insula and frontal operculum, extensive chronic small vessel ischemic changes  Echo 5/17 >> Left Ventricle: Left ventricular ejection fraction, by estimation, is 55 to 60%. The left ventricle has normal function. The left ventricle has no regional wall motion abnormalities. The left ventricular internal cavity size was normal in size. There is mild concentric left ventricular hypertrophy. Left ventricular diastolic  parameters are consistent with Grade II diastolic dysfunction (pseudonormalization). Elevated left ventricular end-diastolic pressure.  CT brain 5/18 >> small acute infarct in the right parietal cortex, no new hemorrhage, left MCA infarct with progressive cytotoxic edema/swelling in 6 mm midline shif  CT brain 5/21 >> Left more than right cerebral infarcts without progression from brain MRI 2 days ago. Cytotoxic edema causes 1 cm of rightward shift.2. No interval hemorrhage.  CT brain 6/1 >> unchanged. Note paranasal sinus disease, mucosal thickening/frothy secretions in right maxillary sinus  CXR 6/7> low lung volumes, bibasilar atelectatic changes. Tracheostomy tube in mid-trachea   Micro Data:  SARS CoV2 PCR 5/16 >> negative MRSA PCR 5/16 > negative Respiratory culture 5/18 >> nml flora  Blood culture 5/22 >> negative Tracheal aspirte 5/29 >> normal flora  BCx2 5/29 >> negative  UA 6/1 >> negative  Blood culture 6x4--1 out of 4, likely contaminant  Antimicrobials:  Unasyn 5/22 > 5/29 Cefepime 5/29 >6/5  Interim history/subjective:  Emesis overnight x1, EN held Temperature increasing, not yet febrile   Resuming EN and FWF this morning PSV wean > 30 minutes, ongoing and tolerating well   Objective   Blood pressure (!) 131/91, pulse 92, temperature 99.8 F (37.7 C), temperature source Axillary, resp. rate 18, height 5\' 7"  (1.702 m), weight 77.9 kg, SpO2 100 %.    Vent Mode: PRVC FiO2 (%):  [40 %] 40 % Set Rate:  [16 bmp] 16 bmp Vt Set:  [520 mL] 520 mL PEEP:  [5 cmH20] 5 cmH20 Plateau Pressure:  [16 cmH20-18 cmH20] 18 cmH20   Intake/Output Summary (Last 24 hours)  at 10/24/2019 0721 Last data filed at 10/24/2019 0600 Gross per 24 hour  Intake 2491.57 ml  Output 525 ml  Net 1966.57 ml   Filed Weights   10/21/19 0500 10/23/19 0500 10/24/19 0500  Weight: 72.7 kg 77.8 kg 77.9 kg    Examination: General: Chronically ill appearing older adult M, trach/vent, NAD  HEENT:  NCAT pink mmm trach secure anicteric sclera Neuro: Awake, tracking. Does not follow commands. Spontaneous L sided movement.  CV: RRR s1s2 no rgm. Cap refil < 3 seconds  PULM: CTA bilaterally. No accessory muscle use on PSV.  GI: Soft, round, ndnt.  GU: Catheter with yellow urine  MSK: R hand brace. No obvious joint deformity. No cyanosis or clubbing  Skin: c/d/warm without rash   Resolved Hospital Problem list   Non-anion anion gap metabolic acidosis Acute hypoxic respiratory insufficiency with compromised airway-Extubated 5/21 Hypertensive emergency Therapeutic hypernatremia, mild hyperchloremic  Aspiration PNA  T-max 101.6  Assessment & Plan:   L MCA infarcts due to L M1 occlusion, s/p unsuccessful L MCA thrombectomy; L ICA large vessel disease R MCA infarcts in setting of cocaine abuse R ICA , R MCA large vessel stenoses Cerebral edema  -MRI brain showed evolving bilateral cerebral infarcts, large left MCA infarct, unchanged cytotoxic edema with 8 mm of rightward midline shift.  There is negative flow in the intracranial left ICA left MCA or left A1 segment P -post- CVA care per neuro  -prognosis for meaningful recovery is guarded, family wishes for more time to allow for possible recovery -Will likely need LTAC    Acute respiratory failure due to inability to protect airway  Recurrent aspiration Atelectasis -Had aspiration PNA, completed abx.  S/P trach 6/4.-trach care per protocol  -s/p course of cefepime, completed 6/5  -CXR 6/7 w low lung volumes, atelectasis  P -Continue PSV wean as tolerated -VAP bundle  -Routine trach care -3% nebs BID -AM CXR  Fever, intermittent  Leukocytosis  Potential for multiple sources > sinus disease on CT, neuro from CVA / edema, aspiration PNA / effusion on left, diarrhea, drug fever  -Antibiotics completed at 14 days on 6/5 P -Trend fever curve, WBC  -monitoring off abx  -Continue to follow BCx (likely contaminant) and resp cx   (so far consistent with normal flora)  Hx of hypertension  Hx of HLD. P -Continue tele monitoring -PRN labetalol   CKD 2 -baseline cr about 1.2  -interval down-trend in Cr from 1.33 to 1.17  Hypernatremia, stable  P -Continue FWF 283ml  q4hr -- consider increase 6/8 if no improvement, but for 6/7 will resume at 22ml q4hr (held overnight due to 1x emesis) -trend UOP, renal indices  -minimize nephrotoxic agents  Hyperglycemia -trend, no indication for SSI at this time   Diarrhea  -External BMS  -trend output -holding motility agents  Goals of Care -will likely need LTAC placement -Limited code blue   Daily Goals Checklist  Pain/Anxiety/Delirium protocol (if indicated): PRN fent  VAP protocol (if indicated): yes DVT prophylaxis: lovenox  Nutrition Status: EN , FWF  GI prophylaxis: PPI  Glucose control: monitor Code Status: LCB Family Communication: pending 6/7  Disposition: ICU   Labs   CBC: Recent Labs  Lab 10/18/19 1436 10/19/19 0511 10/20/19 1914 10/20/19 7829 10/21/19 0709 10/21/19 0709 10/22/19 0353 10/22/19 0446 10/23/19 0425 10/24/19 0414 10/24/19 0559  WBC 17.0*   < > 19.6*  --  21.6*  --   --  28.5* 20.6*  --  17.9*  NEUTROABS 12.9*  --   --   --   --   --   --   --   --   --  14.2*  HGB 13.3   < > 12.3*   < > 11.1*   < > 11.6* 11.9* 11.6* 10.2* 10.6*  HCT 41.1   < > 38.4*   < > 34.9*   < > 34.0* 36.6* 35.5* 30.0* 33.0*  MCV 93.6   < > 94.8  --  94.6  --   --  94.6 94.9  --  94.3  PLT 676*   < > 632*  --  637*  --   --  682* 591*  --  551*   < > = values in this interval not displayed.    Basic Metabolic Panel: Recent Labs  Lab 10/20/19 3382 10/20/19 5053 10/21/19 0709 10/21/19 0709 10/22/19 0353 10/22/19 0446 10/23/19 0425 10/24/19 0414 10/24/19 0559  NA 150*   < > 149*   < > 152* 150* 148* 150* 148*  K 4.3   < > 3.8   < > 3.8 4.8 5.1 3.4* 3.7  CL 121*  --  120*  --   --  120* 118*  --  119*  CO2 18*  --  18*  --   --  19* 21*   --  19*  GLUCOSE 204*  --  192*  --   --  146* 124*  --  120*  BUN 70*  --  51*  --   --  40* 38*  --  30*  CREATININE 2.65*  --  1.58*  --   --  1.42* 1.33*  --  1.17  CALCIUM 8.5*  --  8.1*  --   --  8.0* 7.9*  --  8.2*   < > = values in this interval not displayed.   GFR: Estimated Creatinine Clearance: 58.8 mL/min (by C-G formula based on SCr of 1.17 mg/dL). Recent Labs  Lab 10/21/19 0709 10/22/19 0446 10/23/19 0425 10/24/19 0559  WBC 21.6* 28.5* 20.6* 17.9*    Liver Function Tests: Recent Labs  Lab 10/17/19 0937 10/18/19 0705 10/19/19 0511 10/22/19 0446  AST 128* 163* 169* 115*  ALT 282* 327* 414* 236*  ALKPHOS 106 112 135* 126  BILITOT 0.5 0.4 0.7 0.9  PROT 6.8 6.5 7.4 5.8*  ALBUMIN 2.3* 2.1* 2.4* 1.7*   No results for input(s): LIPASE, AMYLASE in the last 168 hours. No results for input(s): AMMONIA in the last 168 hours.  ABG    Component Value Date/Time   PHART 7.469 (H) 10/24/2019 0414   PCO2ART 27.8 (L) 10/24/2019 0414   PO2ART 138 (H) 10/24/2019 0414   HCO3 20.0 10/24/2019 0414   TCO2 21 (L) 10/24/2019 0414   ACIDBASEDEF 3.0 (H) 10/24/2019 0414   O2SAT 99.0 10/24/2019 0414     Coagulation Profile: Recent Labs  Lab 10/20/19 1019  INR 1.4*    Cardiac Enzymes: No results for input(s): CKTOTAL, CKMB, CKMBINDEX, TROPONINI in the last 168 hours.  HbA1C: Hgb A1c MFr Bld  Date/Time Value Ref Range Status  10/03/2019 02:15 AM 5.9 (H) 4.8 - 5.6 % Final    Comment:    (NOTE)         Prediabetes: 5.7 - 6.4         Diabetes: >6.4         Glycemic control for adults with diabetes: <7.0     CBG: Recent Labs  Lab 10/23/19 1234  10/23/19 1613 10/23/19 1941 10/23/19 2319 10/24/19 0333  GLUCAP 139* 99 105* 106* 109*    CRITICAL CARE Performed by: Cristal Generous   Total critical care time: 40 minutes  Critical care time was exclusive of separately billable procedures and treating other patients.  Critical care was necessary to treat or  prevent imminent or life-threatening deterioration.  Critical care was time spent personally by me on the following activities: development of treatment plan with patient and/or surrogate as well as nursing, discussions with consultants, evaluation of patient's response to treatment, examination of patient, obtaining history from patient or surrogate, ordering and performing treatments and interventions, ordering and review of laboratory studies, ordering and review of radiographic studies, pulse oximetry and re-evaluation of patient's condition.   Eliseo Gum MSN, AGACNP-BC [place] [phone] If no answer, [phone] 10/24/2019, 7:21 AM

## 2019-10-24 NOTE — Progress Notes (Cosign Needed)
FOR EDUCATIONAL USE ONLY - NOT OFFICIAL PART OF MEDICAL RECORD    NAME:  Riley Wagner, MRN:  [id number], DOB:  March 29, 1956, LOS: 43 ADMISSION DATE:  10/02/2019, CONSULTATION DATE:  10/02/19 REFERRING MD:  Norma Fredrickson, CHIEF COMPLAINT:  AMS   Brief History   64 year old male who presented to the ED on 10/02/19 with hypertensive crisis, right sided weakness, and aphasia and was found to have a left terminal ICA occlusion. He was noted to have an IPH along posterior limb of internal carotid. He underwent coil embolization on 5/16.  History of present illness   Riley Wagner is a 64 year old male with a PMH significant for HTN and HLD who presented to [facility] on 10/02/19 following hypertensive crisis and right sided weakness and aphasia, found to have left terminal ICA occlusion. He underwent coil embolization and was left intubated and transferred to the ICU on 10/02/19. Of note, he was extubated on 5/21 but required re-intubation on 6/2 for worsening respiratory distress and difficulty clearing secretions. He had a tracheostomy placed on 6/3. His hospital course has been complicated by   Past Medical History  HTN [place] Hospital Events   5/16 Admitted to [facility]. Embolization of left ICA in IR. Returned to ICU intubated. 5/17 Exhibiting right sided hemiparesis and persistent HTN despite clevidipine drip. DNR status 5/18 Worsening midline shift and cerebral edema, persistent HTN.  5/20 Improvement in blood pressure control 5/21 Extubated 6/2 Increased respiratory distress. Reintubated 6/3 Trach 6/5 Aspiration event  Consults:  Neuro IR   Procedures:  Trach 6/3 >> ETT 5/16 >> 5/21 ETT 6/1 >> 6/3 Radial Art line 5/16 >>  Significant Diagnostic Tests:  6/1 CT Head >>Continued interval evolution of a large infarct involving the majority of the left MCA vascular territory and portions of the right ACA territory. Previously demonstrated petechial hemorrhage and/or contrast staining within the  left basal ganglia has become less conspicuous. Additional multifocal hyperdensity within the infarct territory which may reflect spared cortex or petechial hemorrhage. Persistent although decreased mass effect with now 8 mm rightward midline shift.   Expected evolution of multiple additional known infarcts in the right cerebral hemisphere as described.   No interval intracranial abnormality is identified.   Paranasal sinus disease as described. Correlate for acute sinusitis.  6/7 CXR >> Low lung volumes with some streaky basilar atelectatic changes. No new focal consolidative opacity. Support devices as above.   Micro Data:  5/16 SARS CoV2 >> Negative 6/2 Hep panel >> Negative 5/16 HIV >> Negative 6/4 Blood cultures >> Staph, MR 2/4 bottles 6/5 Trach asp >> GNR, GPC chains/clusters  Antimicrobials:  Cefepime 5/29 >> 6/5 Unasyn 5/22 >> 5/29 Vanc 5/29 >> 5/31  Interim history/subjective:  Failed weaning yesterday after 6 minutes PS.  Still with improving low-grade temperatures and improving leukocytosis however there was concern for aspiration event on 6/5 He has been off antibiotics since 6/5 but received 14-day course of broad-spectrum abx  Objective   Blood pressure (!) 156/90, pulse 89, temperature 100.2 F (37.9 C), resp. rate (!) 27, height 5\' 7"  (1.702 m), weight 77.9 kg, SpO2 100 %.    Vent Mode: CPAP;PSV FiO2 (%):  [40 %] 40 % Set Rate:  [16 bmp] 16 bmp Vt Set:  [520 mL] 520 mL PEEP:  [5 cmH20] 5 cmH20 Pressure Support:  [10 cmH20] 10 cmH20 Plateau Pressure:  [16 cmH20-18 cmH20] 18 cmH20   Intake/Output Summary (Last 24 hours) at 10/24/2019 7078 Last data filed  at 10/24/2019 0800 Gross per 24 hour  Intake 2398.84 ml  Output 525 ml  Net 1873.84 ml   Filed Weights   10/21/19 0500 10/23/19 0500 10/24/19 0500  Weight: 72.7 kg 77.8 kg 77.9 kg    Examination: General: African american male laying in bed in no obvious distress and on MV HENT: Non-icteric sclera.  MMM. Trach intact and secured Lungs: Lungs CTAB. No accessory muscle use or signs of respiratory distress. PS 15/5 40% Cardiovascular: Normal S1/S2. NSR on monitor. Radial and DP pulses 2+ Abdomen: Soft, non-tender, not distended. PEG tube present Extremities: No peripheral edema. Normal muscle bulk and tone. No obvious joint deformities Neuro: Does not follow commands. Localizes to pain with LUE. Opens eyes to painful stimuli GU: Condom catheter in place draining clear yellow urine  Resolved Hospital Problem list     Assessment & Plan:  Acute hypoxic and hypercarbic respiratory failure  Reintubated on 6/2 for respiratory distress. Trach on 6/3. ABG today demonstrates mild uncompensated respiratory alkalosis.   Plan: -Wean PSV as tolerated. Currently on PS 15/5 40% -VAP prevention bundle -Pulmonary hygiene  Left MCA CVA secondary to Left ICA occlusion, s/p coil embolization Cerebral edema CT Head 6/1 with expected evolution of left MCA cytotoxic edema with decreased mass effect. No significant neuro changes.   Plan: -Per Neurology -PT/OT -ASA/Plavix  AKI Oliguric overnight. Cr 1.33 >> 1.17. Suspect etiology pre-renal from GI volume losses.  Plan: -mIVF LR @ 136mL/h -Trend renal indices -Trend urine output  Hypertensive emergency, improved Home meds lisinopril 40mg  daily, amlodipine 10mg  daily, carvedilol 3.125mg  BID, and HCTZ 25mg  daily. Blood pressure has been stable.  Plan: -SBP < 180 -Labetalol PRN if SBP > 180  Hyperlipidemia LDL 95.3. Clinical ASCVD goal < 70mg /dL  Plan: -Elevated LFT's  -Add statin prior to D/C once transaminitis resolves  Hypernatremia, mild Appears dry on exam. Oliguric (325 urine in 24 hours). He also has had GI losses including liquid stool and vomiting episode overnight.  Plan: -Discontinue 0.9% NaCl and switch to LR @ 135mL/h mIVF for hydration and lower salt content. -Continue to trend Na and UO -Continue FWF  232mL/q4h  Leukocytosis and low-grade fever Finished 14-day course of antibiotics on 6/5 (Unasyn >> Cefepime) for suspected aspiration PNA. Blood cultures with MR staph in 2/4 bottles. Possibly contaminant. CXR unremarkable with improving aeration of left lung. Still some atelectasis but did have a reported aspiration event on 6/5.  Plan: -Continue to trend WBC and fever curve -Doubtful need to add antibiotics at this time -CXR AM  Best practice:  Diet: Osmolite 1.5 @ 53mL/h, per nutrition Pain/Anxiety/Delirium protocol (if indicated): APAP, Fentanyl pushes VAP protocol (if indicated): Yes DVT prophylaxis: Enoxaparin GI prophylaxis: PPI Glucose control: Not requiring SSI, Euglycemic Mobility: BR Code Status: LCB Family Communication: Pending Disposition: Neuro ICU  Labs   CBC: Recent Labs  Lab 10/18/19 1436 10/19/19 0511 10/20/19 5093 10/20/19 2671 10/21/19 0709 10/21/19 0709 10/22/19 0353 10/22/19 0446 10/23/19 0425 10/24/19 0414 10/24/19 0559  WBC 17.0*   < > 19.6*  --  21.6*  --   --  28.5* 20.6*  --  17.9*  NEUTROABS 12.9*  --   --   --   --   --   --   --   --   --  14.2*  HGB 13.3   < > 12.3*   < > 11.1*   < > 11.6* 11.9* 11.6* 10.2* 10.6*  HCT 41.1   < > 38.4*   < >  34.9*   < > 34.0* 36.6* 35.5* 30.0* 33.0*  MCV 93.6   < > 94.8  --  94.6  --   --  94.6 94.9  --  94.3  PLT 676*   < > 632*  --  637*  --   --  682* 591*  --  551*   < > = values in this interval not displayed.    Basic Metabolic Panel: Recent Labs  Lab 10/20/19 9242 10/20/19 6834 10/21/19 0709 10/21/19 0709 10/22/19 0353 10/22/19 0446 10/23/19 0425 10/24/19 0414 10/24/19 0559  NA 150*   < > 149*   < > 152* 150* 148* 150* 148*  K 4.3   < > 3.8   < > 3.8 4.8 5.1 3.4* 3.7  CL 121*  --  120*  --   --  120* 118*  --  119*  CO2 18*  --  18*  --   --  19* 21*  --  19*  GLUCOSE 204*  --  192*  --   --  146* 124*  --  120*  BUN 70*  --  51*  --   --  40* 38*  --  30*  CREATININE 2.65*  --   1.58*  --   --  1.42* 1.33*  --  1.17  CALCIUM 8.5*  --  8.1*  --   --  8.0* 7.9*  --  8.2*   < > = values in this interval not displayed.   GFR: Estimated Creatinine Clearance: 58.8 mL/min (by C-G formula based on SCr of 1.17 mg/dL). Recent Labs  Lab 10/21/19 0709 10/22/19 0446 10/23/19 0425 10/24/19 0559  WBC 21.6* 28.5* 20.6* 17.9*    Liver Function Tests: Recent Labs  Lab 10/18/19 0705 10/19/19 0511 10/22/19 0446  AST 163* 169* 115*  ALT 327* 414* 236*  ALKPHOS 112 135* 126  BILITOT 0.4 0.7 0.9  PROT 6.5 7.4 5.8*  ALBUMIN 2.1* 2.4* 1.7*   No results for input(s): LIPASE, AMYLASE in the last 168 hours. No results for input(s): AMMONIA in the last 168 hours.  ABG    Component Value Date/Time   PHART 7.469 (H) 10/24/2019 0414   PCO2ART 27.8 (L) 10/24/2019 0414   PO2ART 138 (H) 10/24/2019 0414   HCO3 20.0 10/24/2019 0414   TCO2 21 (L) 10/24/2019 0414   ACIDBASEDEF 3.0 (H) 10/24/2019 0414   O2SAT 99.0 10/24/2019 0414     Coagulation Profile: Recent Labs  Lab 10/20/19 1019  INR 1.4*    Cardiac Enzymes: No results for input(s): CKTOTAL, CKMB, CKMBINDEX, TROPONINI in the last 168 hours.  HbA1C: Hgb A1c MFr Bld  Date/Time Value Ref Range Status  10/03/2019 02:15 AM 5.9 (H) 4.8 - 5.6 % Final    Comment:    (NOTE)         Prediabetes: 5.7 - 6.4         Diabetes: >6.4         Glycemic control for adults with diabetes: <7.0     CBG: Recent Labs  Lab 10/23/19 1613 10/23/19 1941 10/23/19 2319 10/24/19 0333 10/24/19 0812  GLUCAP 99 105* 106* 109* 122*    Review of Systems:   Unable to obtain due to encephalopathy  Past Medical History  He,  has a past medical history of Hypertension and MI (myocardial infarction) ([place]).   Surgical History    Past Surgical History:  Procedure Laterality Date   BACK SURGERY     2004  ESOPHAGOGASTRODUODENOSCOPY N/A 10/14/2019   Procedure: ESOPHAGOGASTRODUODENOSCOPY (EGD);  Surgeon: Jesusita Oka, MD;   Location: [place];  Service: General;  Laterality: N/A;   IR ANGIOGRAM FOLLOW UP STUDY  10/02/2019   IR CT HEAD LTD  10/02/2019   IR CT HEAD LTD  10/02/2019   IR PERCUTANEOUS ART THROMBECTOMY/INFUSION INTRACRANIAL INC DIAG ANGIO  10/02/2019   IR PTA INTRACRANIAL  10/02/2019   IR TRANSCATH/EMBOLIZ  10/02/2019   IR US GUIDE VASC ACCESS RIGHT  10/02/2019   PEG PLACEMENT N/A 10/14/2019   Procedure: PERCUTANEOUS ENDOSCOPIC GASTROSTOMY (PEG) PLACEMENT;  Surgeon: Jesusita Oka, MD;  Location: [place];  Service: General;  Laterality: N/A;   RADIOLOGY WITH ANESTHESIA N/A 10/02/2019   Procedure: IR WITH ANESTHESIA;  Surgeon: Radiologist, Medication, MD;  Location: [place];  Service: Radiology;  Laterality: N/A;     Social History   reports that he quit smoking about 5 years ago. His smoking use included cigarettes. He has a 5.00 pack-year smoking history. He has never used smokeless tobacco. He reports current alcohol use. He reports that he does not use drugs.   Family History   His family history includes Diabetes in his mother; Hypertension in his mother; Stroke in his mother.   Allergies No Known Allergies   Home Medications  Prior to Admission medications   Medication Sig Start Date End Date Taking? Authorizing Provider  lisinopril (ZESTRIL) 40 MG tablet Take 1 tablet (40 mg total) by mouth daily. To help lower blood pressure Patient taking differently: Take 40 mg by mouth daily.  03/23/19  Yes Fulp, Cammie, MD  acetaminophen (TYLENOL) 500 MG tablet Take 2 tablets (1,000 mg total) by mouth every 6 (six) hours as needed. Patient not taking: Reported on 10/02/2019 02/13/17   Alfonse Spruce, FNP  amLODipine (NORVASC) 10 MG tablet Take one pill one time daily to help lower blood pressure Patient not taking: Reported on 10/02/2019 03/23/19   Antony Blackbird, MD  aspirin EC 81 MG tablet Take 1 tablet (81 mg total) by mouth daily. Patient not taking: Reported on 10/02/2019 02/13/17   Alfonse Spruce, FNP  atorvastatin (LIPITOR) 40 MG tablet Take 1 tablet (40 mg total) by mouth daily. To help lower cholesterol Patient not taking: Reported on 10/02/2019 03/23/19   Fulp, Cammie, MD  carvedilol (COREG) 3.125 MG tablet TAKE ONE TABLET BY MOUTH TWICE DAY to help lower blood pressure Patient not taking: Reported on 10/02/2019 03/23/19   Fulp, Cammie, MD  cyclobenzaprine (FLEXERIL) 5 MG tablet Take 1 tablet (5 mg total) by mouth every 8 (eight) hours as needed for muscle spasms. Patient not taking: Reported on 10/02/2019 02/13/17   Alfonse Spruce, FNP  hydrochlorothiazide (HYDRODIURIL) 25 MG tablet Take 1 tablet (25 mg total) by mouth daily. To help lower blood pressure Patient not taking: Reported on 10/02/2019 03/23/19   Antony Blackbird, MD     Critical care time: 70 minutes    Electronically signed by: Adonis Brook, [place], 10/24/19 11:18

## 2019-10-24 NOTE — TOC Progression Note (Signed)
Transition of Care [facility]) - Progression Note    Patient Details  Name: Riley Wagner MRN: [id number] Date of Birth: 02/09/1956  Transition of Care [facility] CM/SW Contact  Oren Section Cleta Alberts, RN Phone Number: 10/24/2019, 5:31 PM  Clinical Narrative:  Pt has been offered beds at both area [place].  Spoke with daughter, Judson Roch, she and family prefer [facility] for [facility]AC needs.  No bed available today, but this changes daily, per admissions coordinator. Will provide updates on bed availability.       Expected Discharge Plan: Long Term Acute Care (LTAC) Barriers to Discharge: Continued Medical Work up, SNF Pending bed offer  Expected Discharge Plan and Services Expected Discharge Plan: Long Term Acute Care (LTAC)                                               Social Determinants of Health (SDOH) Interventions    Readmission Risk Interventions No flowsheet data found. Reinaldo Raddle, RN, BSN  Trauma/Neuro ICU Case Manager [phone]

## 2019-10-24 NOTE — Progress Notes (Signed)
STROKE TEAM PROGRESS NOTE   INTERVAL HISTORY  Patient remains critically ill in resp failure  on ventilatory support on tracheostomy.  He is barely arousable opens eyes but does not follow any commands.  I he continues to be febrile off and on and is on cefepime antibiotic since 10/15/2019 White count is improved to 17.9.  He is being weaned today for the last 2 hours and so far seems to doing okay plan is to refer to LTAC   Vitals:   10/24/19 1300 10/24/19 1400 10/24/19 1500 10/24/19 1519  BP: 138/89 (!) 150/83 132/80 132/80  Pulse: 91 93 88   Resp: 19 (!) 21 16   Temp:      TempSrc:      SpO2: 100% 100% 100%   Weight:      Height:       CBC:  Recent Labs  Lab 10/18/19 1436 10/19/19 0511 10/23/19 0425 10/23/19 0425 10/24/19 0414 10/24/19 0559  WBC 17.0*   < > 20.6*  --   --  17.9*  NEUTROABS 12.9*  --   --   --   --  14.2*  HGB 13.3   < > 11.6*   < > 10.2* 10.6*  HCT 41.1   < > 35.5*   < > 30.0* 33.0*  MCV 93.6   < > 94.9  --   --  94.3  PLT 676*   < > 591*  --   --  551*   < > = values in this interval not displayed.   Basic Metabolic Panel:  Recent Labs  Lab 10/23/19 0425 10/23/19 0425 10/24/19 0414 10/24/19 0559  NA 148*   < > 150* 148*  K 5.1   < > 3.4* 3.7  CL 118*  --   --  119*  CO2 21*  --   --  19*  GLUCOSE 124*  --   --  120*  BUN 38*  --   --  30*  CREATININE 1.33*  --   --  1.17  CALCIUM 7.9*  --   --  8.2*   < > = values in this interval not displayed.   IMAGING past 24 hours  DG Chest Port 1 View  Result Date: 10/24/2019 CLINICAL DATA:  Respiratory failure, tracheostomy EXAM: PORTABLE CHEST 1 VIEW COMPARISON:  Radiograph 10/22/2019 FINDINGS: Tracheostomy tube remains in the mid trachea. Telemetry leads and additional support devices overlie the chest. Low lung volumes slightly diminished from comparison exam with some streaky basilar atelectatic changes. No new focal consolidative opacity. No visible pneumothorax or effusion. Stable  cardiomediastinal contours. No acute osseous or soft tissue abnormality. IMPRESSION: Low lung volumes with some streaky basilar atelectatic changes. No new focal consolidative opacity. Support devices as above. Electronically Signed   By: Lovena Le M.D.   On: 10/24/2019 06:07    PHYSICAL EXAM    Temp:  [99.5 F (37.5 C)-101.5 F (38.6 C)] 101.5 F (38.6 C) (06/07 1200) Pulse Rate:  [79-98] 88 (06/07 1500) Resp:  [16-39] 16 (06/07 1500) BP: (116-177)/(73-104) 132/80 (06/07 1519) SpO2:  [100 %] 100 % (06/07 1500) FiO2 (%):  [40 %] 40 % (06/07 1519) Weight:  [77.9 kg] 77.9 kg (06/07 0500)  General - Well nourished, well developed middle-aged African-American male s/p tracheostomy, on ventilator off precedex  Ophthalmologic - fundi not visualized due to noncooperation.  Cardiovascular - Regular rate and rhythm.  Neuro - on trach off precedex, eyes open, globally aphasic not following commands.  Eyes in left gaze position, not blinking to visual threat on the right, doll's eyes present, not tracking in right visual field but tracking on the left, PERRL. Corneal reflex present, gag and cough present. Facial symmetry not able to test due to ET tube.  Tongue protrusion not cooperative. On pain stimulation, LUE at least 4/5 and LLE 2+/5, but no movement on the RUE and slight withdraw at RLE, increased muscle tone on the right. Sensation, coordination and gait not tested.     ASSESSMENT/PLAN Mr. Thorne Wirz is a 64 y.o. male with history of HTN who fell the night prior to admission, presenting with right sided weakness and aphasia. Taken to IR for terminus Left ICA occlusion.  Stroke:   L MCA infarcts due to left M1 occlusion s/p unsuccessful L MCA thrombectomy - L MCA infarct likely secondary to L ICA large vessel disease.  Stroke:   Right MCA infarcts in setting of cocaine use and right ICA and MCA large vessel stenoses     Code Stroke CT head cytotoxic edema L insula and frontal  operculum. Likely old L parietal cortical and subcortical abnormality. ASPECTS 8    CTA head & neck L ICA occlusion. Widespread atherosclerosis: B CCA to ICA bifurcation R 40%, L 50% w/ extensive soft plaques at bifurcation and bulbs, L 30-40%. Severe B ICA siphon stenoses. R MCA and B PCA atherosclerosis w/ narrowing & irregularity.  CT perfusion 105 penumbra L MCA territory.   Cerebral angio - 10/02/19 - [name], MD - diffuse and severe intracranial atherosclerosis. Proximal L M1 occlusion. Complete recanalization L M1 w/ embotrap w/ poor distal flow d/t severe L ICA stenosis s/p angioplasty. L ICA terminus coil embolization w/ sparing L anterior choroidal.  CT head repeat 5/16 large L basal ganglia hemorrhage/contrast posterior SDH and SAH. Large cytotoxic edema L MCA w/ 74mm midline shift.   CT head 5/17 diminishing SAH contrast. L MCA infarct w/o significant mass effect  CT head 5/18 diminishing SAH. L MCA infarct increased edema, MLS 74mm. Right parietal small new infarct.   MRI evolving B cerebral infarcts including large L MCA infarct w/ edema and 70mm R midline shift, right parietal and frontal infarcts. No significant hemorrhage.  MRA No flow L carotid systeme following thrombectomy. Advanced intracranial atherosclerosis including moderate R ICA, mild R M1, severe proximal R M2 and severe L V4 stenoses.    CT repeat 5/21 L>R infarct w/o progression. 1cm R midline shift. No hemorrhage   CT repeat 5/23 - Continued interval evolution of left greater than right ischemic infarcts with unchanged 1cm of left-to-right shift.   CT repeat 6/1 - continued interval evolution of left large infarct, MLS at 10mm  2D Echo EF 55-60%   TG 471 and direct LDL 95.3, goal < 70  HgbA1c 5.9   UDS + cocaine and THC  lovenox 40 for VTE prophylaxis  No antithrombotic prior to admission, now on ASA 325mg  and plavix for B ICA severe stenoses. Continue DAPT x 3 months then aspirin  alone.   Therapy recommendations:  SNF   Disposition: pending   Cerebral Edema, resolving  Present on admission scan  Repeat CT head 5/17 large left MCA infarct with edema but no significant midline shift  CT 5/18 repeat L MCA infarct increased edema, MLS 47mm. Right parietal small new infarct.  MRI 5/19 - large L MCA infarct with MLS 22mm  CT 5/21 - stable b/l infarct with MLS 1cm  CT 5/23 -  unchanged 10 mm of left-to-right shift.   CT 6/1 - 62mm left to right MLS  Na 149-146-151-150-148  Acute hypoxic respiratory failure with compromised airway  Intubated for IR, continued d/t compromised airway  CXR 5/21 - increased left lung base consolidation/atelectasis. pulm vasc congestion  CXR 6/1 - Low volume chest with atelectasis or mild infiltrate at the left base.  Extubated 5/21  Still has difficulty with oral secretions  On CPT, neb, NT suction -> developed acute respiratory failure -> BiPAP -> Reintubated 6/2 ->Trach 6/3  Sepsis, septic shock Fever  leukocytosis Pneumonia due to aspiration  Tmax 100.8->100.5->100.6->101.6->102.9->103.3->99.7 - cooling blanket  Leukocytosis - 16.8->17.0->22.0->19.6->21.6->28.5->20.6  UA - 10/18/19 neg, repeat 10/21/19 neg  CXR 6/1 - Low volume chest with atelectasis or mild infiltrate at the left base.  CXR 6/2 - Hazy airspace opacity at the left lung base which could be due to atelectasis and/or infectious etiology.  Sputum culture 5/18 normal respiratory flora  PICC out d/t temp. Has PIV  Blood culture 5/29  NGTD - repeat culture 10/21/19 - Staph - Methicillin resistance detected - critical result called to pharmacy - Sherlon Handing, PharmD, BCPS - continue to follow cultures - MD notified  Resp Cx 6/5 - ABUNDANT GRAM NEGATIVE RODS ; MODERATE GRAM POSITIVE COCCI IN CHAINS IN CLUSTERS ; RARE GRAM POSITIVE RODS - final pending  E-Link recommends Cdiff evaluation  Tylenol / motrin prn   Unasyn - 5/22>>5/29  Cefepime 5/29>>  6/4  VANC 5/29>>5/31  Currently on no antibiotics  CCM on board now  Hypertensive Emergency Hypotension, septic shock and medication effect  SBP > 220 on arrival  Home meds:  Amlodipine 10, coreg 3.125 bid, HCTZ 25, lisinopril 40, holding at this time due to hypotension, has labetalol prn  Off cleviprex now  On amlodipine 10, clonidine 0.1 tid, hydralazine 100 q8, labetalol 300 Q8, lisinopril 40, hygroton 25 - all anti-htn meds d/c'ed 5/29 for significant hypotension  . Hypotensive today - 104/54 -> 89/51 . SBP goal < 180  . Long-term BP goal normotensive . NS IV at 75 cc / hr ->  Will give 250 cc NS bolus  Hyperlipidemia  Home meds:  lipitor 40, resumed in hospital  Direct LDL 95.3, goal < 70  On lipitor 40 - d/c'ed 10/18/19 due to elevated LFTs  Consider statin at discharge  Dysphagia . Secondary to stroke . NPO . PEG placement 5/28 -> on tube feedings -> now on hold due to aspiration 6/5 . on free water 200 q4h  . IVF @ 50 -> 75 . Speech on board  CKD stage III  Cre 1.39->1.33->1.36->1.38->2.65->1.58->1.42->1.33  Likely due to hypotension and sepsis  CCM on board  On IVF and FW  Close monitoring  Elevated LFTs, improving  AST/ALT -128/282 -> 163/327->169/414 ->115/236  Hepatitis panel neg  Likely related to infection and sepsis  Statin discontinued for now  Cocaine abuse  UDS positive for cocaine  Cessation education will be provided   Other Stroke Risk Factors  Advanced age  Former Cigarette smoker, quit 5 yrs ago  ETOH use, advised to drink no more than 2 drink(s) a day  THC abuse - UDS:  THC POSITIVE, cessation education will be provided.  Family hx stroke (mother)  Coronary artery disease, MI  Other Active Problems  Left eye conjunctivitis and subconjunctival hemorrhage - Ciprofloxacin drops started 5/21>>5/26 - improved  Constipation - Dulcolax sppositories  Hyperglycemia -> SSI  Lethargy. Amantadine 100 bid added  10/13/19  Hypokalemia -  3.4 ->4.9->4.3->5.[facility] day # 22   Patient remains critically ill due to respiratory failure requiring ventilatory support despite tracheostomy.  Continue weaning of ventilatory support and antibiotics for infection as per critical care team.  He will likely need LTAC for placement and weaning of ventilator support.  No family available for discussion at the bedside.  Discussed with Dr. Elsworth Soho critical care medicine.  Stroke team will sign off.  Kindly call for questions. This patient is critically ill and at significant risk of neurological worsening, death and care requires constant monitoring of vital signs, hemodynamics,respiratory and cardiac monitoring, extensive review of multiple databases, frequent neurological assessment, discussion with family, other specialists and medical decision making of high complexity.I have made any additions or clarifications directly to the above note.This critical care time does not reflect procedure time, or teaching time or supervisory time of PA/NP/Med Resident etc but could involve care discussion time.  I spent 30 minutes of neurocritical care time  in the care of  this patient.    Antony Contras, MD Medical Director [facility] St[facility] Pager: [phone] 10/24/2019 3:27 PM To contact Stroke Continuity provider, please refer to [email or web address] After hours, contact General Neurology

## 2019-10-24 NOTE — TOC Progression Note (Signed)
Transition of Care [facility]) - Progression Note    Patient Details  Name: Riley Wagner MRN: [id number] Date of Birth: Aug 14, 1955  Transition of Care [facility]) CM/SW Contact  Ella Bodo, RN Phone Number: 10/24/2019, 12:34 PM  Clinical Narrative:  Pt s/p tracheostomy on 6/4; not weaning to trach collar.  Spoke with provider about possible LTAC referral and consult requested.  Referral to both area LTAC hospitals to screen pt for eligibility.      Expected Discharge Plan: Long Term Acute Care (LTAC) Barriers to Discharge: Continued Medical Work up, SNF Pending bed offer  Expected Discharge Plan and Services Expected Discharge Plan: Long Term Acute Care (LTAC)                                               Social Determinants of Health (SDOH) Interventions    Readmission Risk Interventions No flowsheet data found.  Reinaldo Raddle, RN, BSN  Trauma/Neuro ICU Case Manager [phone]

## 2019-10-25 ENCOUNTER — Inpatient Hospital Stay (HOSPITAL_COMMUNITY): Payer: Medicare Other

## 2019-10-25 LAB — GLUCOSE, CAPILLARY
Glucose-Capillary: 116 mg/dL — ABNORMAL HIGH (ref 70–99)
Glucose-Capillary: 122 mg/dL — ABNORMAL HIGH (ref 70–99)
Glucose-Capillary: 127 mg/dL — ABNORMAL HIGH (ref 70–99)
Glucose-Capillary: 130 mg/dL — ABNORMAL HIGH (ref 70–99)
Glucose-Capillary: 134 mg/dL — ABNORMAL HIGH (ref 70–99)
Glucose-Capillary: 142 mg/dL — ABNORMAL HIGH (ref 70–99)

## 2019-10-25 LAB — BASIC METABOLIC PANEL
Anion gap: 10 (ref 5–15)
BUN: 28 mg/dL — ABNORMAL HIGH (ref 8–23)
CO2: 21 mmol/L — ABNORMAL LOW (ref 22–32)
Calcium: 8.1 mg/dL — ABNORMAL LOW (ref 8.9–10.3)
Chloride: 120 mmol/L — ABNORMAL HIGH (ref 98–111)
Creatinine, Ser: 1.11 mg/dL (ref 0.61–1.24)
GFR calc Af Amer: >60 mL/min (ref >60)
GFR calc non Af Amer: >60 mL/min (ref >60)
Glucose, Bld: 131 mg/dL — ABNORMAL HIGH (ref 70–99)
Potassium: 4.1 mmol/L (ref 3.5–5.1)
Sodium: 151 mmol/L — ABNORMAL HIGH (ref 135–145)

## 2019-10-25 LAB — CBC
HCT: 32.5 % — ABNORMAL LOW (ref 39.0–52.0)
Hemoglobin: 10.4 g/dL — ABNORMAL LOW (ref 13.0–17.0)
MCH: 30.4 pg (ref 26.0–34.0)
MCHC: 32 g/dL (ref 30.0–36.0)
MCV: 95 fL (ref 80.0–100.0)
Platelets: 520 10*3/uL — ABNORMAL HIGH (ref 150–400)
RBC: 3.42 MIL/uL — ABNORMAL LOW (ref 4.22–5.81)
RDW: 15.6 % — ABNORMAL HIGH (ref 11.5–15.5)
WBC: 18.1 10*3/uL — ABNORMAL HIGH (ref 4.0–10.5)
nRBC: 0.2 % (ref 0.0–0.2)

## 2019-10-25 MED ORDER — FREE WATER
250.0000 mL | Status: DC
  Administered 2019-10-25 – 2019-10-26 (×11): 250 mL

## 2019-10-25 NOTE — Progress Notes (Signed)
NAME:  Riley Wagner, MRN:  [id number], DOB:  06/07/1955, LOS: 23 ADMISSION DATE:  10/02/2019, CONSULTATION DATE:  10/02/2019 REFERRING MD:  Dr. Debbrah Alar, neuro IR, CHIEF COMPLAINT:  Altered mental status   Brief History   64 y/o male former smoker brought to ER with hypertensive crisis (BP 212/110), Rt sided weakness and aphasia.  Found to have Lt terminal ICA occlusion.  Intubated for airway protection. Noted to have intraparenchymal bleed along posterior limb of IC as well as Rt sided bleed.  Had embolization of Lt ICA terminus.  Past Medical History  HTN, [facility] Events   5/16 Admit. To IR for embolization of Left ICA. Back to ICU intubated. BP goal 120-140 5/17 still hypertensive in spite of maximum dose Cleviprex.  Continues to exhibit right-sided hemiparesis 5/18 CT brain showing worsening midline shift and cerebral edema.  Still having difficulty with blood pressure control, requiring titration up of oral regimen.  Of note was made DO NOT RESUSCITATE by family on 5/17 5/20 bp under control. Moving all ext except RUE. Family; leaning towards attempt at extubation and trach if fails.  5/21 Extubated  5/28 Planning for placement of PEG today  6/02 Increased respiratory distress yesterday. Brought to ICU and intubated.  Hypotensive overnight.  6/5 aspiration event  Consults:  Neuro IR  Procedures:  ETT 5/16 >> 5/21   Significant Diagnostic Tests:   UDS 5/16 >> positive for cocaine, THC  CT head 5/16 >> acute cytotoxic edema in Lt insula and frontal operculum, extensive chronic small vessel ischemic changes  Echo 5/17 >> Left Ventricle: Left ventricular ejection fraction, by estimation, is 55 to 60%. The left ventricle has normal function. The left ventricle has no regional wall motion abnormalities. The left ventricular internal cavity size was normal in size. There is mild concentric left ventricular hypertrophy. Left ventricular diastolic parameters are  consistent with Grade II diastolic dysfunction (pseudonormalization). Elevated left ventricular end-diastolic pressure.  CT brain 5/18 >> small acute infarct in the right parietal cortex, no new hemorrhage, left MCA infarct with progressive cytotoxic edema/swelling in 6 mm midline shif  CT brain 5/21 >> Left more than right cerebral infarcts without progression from brain MRI 2 days ago. Cytotoxic edema causes 1 cm of rightward shift.2. No interval hemorrhage.  CT brain 6/1 >> unchanged. Note paranasal sinus disease, mucosal thickening/frothy secretions in right maxillary sinus  CXR 6/7> low lung volumes, bibasilar atelectatic changes. Tracheostomy tube in mid-trachea   Micro Data:  SARS CoV2 PCR 5/16 >> negative MRSA PCR 5/16 > negative Respiratory culture 5/18 >> nml flora  Blood culture 5/22 >> negative Tracheal aspirte 5/29 >> normal flora  BCx2 5/29 >> negative  UA 6/1 >> negative  Blood culture 6x4--1 out of 4, likely contaminant, CoNS resp cx 6/5: normal flora  Antimicrobials:  Unasyn 5/22 > 5/29 Cefepime 5/29 >6/5  Interim history/subjective:  6/8: sodium up today 151, wbc 18.1, tmax 101.5/24h mental status remains the same per chart review. Not following commands, tracks to verbal stim. On ps at this time.  6/7:Emesis overnight x1, EN held Temperature increasing, not yet febrile   Resuming EN and FWF this morning PSV wean > 30 minutes, ongoing and tolerating well   Objective   Blood pressure 139/85, pulse 83, temperature 99.2 F (37.3 C), resp. rate 16, height 5\' 7"  (1.702 m), weight 77.8 kg, SpO2 100 %.    Vent Mode: PRVC FiO2 (%):  [40 %] 40 % Set Rate:  [16 bmp] 16  bmp Vt Set:  [520 mL] 520 mL PEEP:  [5 cmH20] 5 cmH20 Pressure Support:  [10 cmH20] 10 cmH20 Plateau Pressure:  [15 cmH20-18 cmH20] 15 cmH20   Intake/Output Summary (Last 24 hours) at 10/25/2019 0734 Last data filed at 10/25/2019 0600 Gross per 24 hour  Intake 1082.57 ml  Output 2050 ml  Net  -967.43 ml   Filed Weights   10/23/19 0500 10/24/19 0500 10/25/19 0500  Weight: 77.8 kg 77.9 kg 77.8 kg   cxr 6/8: no acute disease, personally reviewed by me.   Examination: General: Chronically ill appearing older adult M, trach/vent, NAD  HEENT: NCAT pink mmm trach secure anicteric sclera but injected Neuro: Awake, tracking. Does not follow commands. Spontaneous L sided movement. Unable to examine L pupil 2/2 pt refusing to open eyelid CV: RRR s1s2 no rgm. Cap refil < 3 seconds  PULM: CTA bilaterally. No accessory muscle use on PSV.  GI: Soft, ndnt. bs + GU: Catheter with yellow urine  MSK: R hand brace. No obvious joint deformity. No cyanosis or clubbing  Skin: c/d/warm without rash   Resolved Hospital Problem list   Non-anion anion gap metabolic acidosis Acute hypoxic respiratory insufficiency with compromised airway-Extubated 5/21 Hypertensive emergency Therapeutic hypernatremia, mild hyperchloremic  Aspiration PNA  T-max 101.6  Assessment & Plan:   L MCA infarcts due to L M1 occlusion, s/p unsuccessful L MCA thrombectomy; L ICA large vessel disease R MCA infarcts in setting of cocaine abuse R ICA , R MCA large vessel stenoses Cerebral edema  -MRI brain showed evolving bilateral cerebral infarcts, large left MCA infarct, unchanged cytotoxic edema with 8 mm of rightward midline shift.  There is negative flow in the intracranial left ICA left MCA or left A1 segment P -post- CVA care per neuro  -prognosis for meaningful recovery is guarded, family wishes for more time to allow for possible recovery -Will likely need LTAC    Acute respiratory failure due to inability to protect airway  Recurrent aspiration Atelectasis -Had aspiration PNA, completed abx.  S/P trach 6/4.-trach care per protocol  -s/p course of cefepime, completed 6/5  -CXR 6/8 with left basilar infiltrate, atelectasis  P -Continue PSV wean as tolerated to ATC -VAP bundle  -Routine trach care -3% nebs  BID  Fever, intermittent  Leukocytosis  Potential for multiple sources > sinus disease on CT, neuro from CVA / edema, aspiration PNA / effusion on left, diarrhea, drug fever  -Antibiotics completed at 14 days on 6/5 P -Trend fever curve, WBC  -monitoring off abx  -cultures negative. -warrants recx if cont to spike temp and wbc rises further.    Hx of hypertension  Hx of HLD. P -Continue tele monitoring -PRN labetalol   CKD 2 -baseline cr about 1.2  -stable Hypernatremia, worsening P -increase fwf to 250q3h -may req d5w if not improved tomorrow.  -trend UOP, renal indices: relatively stable  -minimize nephrotoxic agents -repeat bmp in am.   Hyperglycemia -trend, no indication for SSI at this time   Diarrhea  -trend output -holding motility agents  Goals of Care -will likely need LTAC placement -appreciate cm assistance -Limited code blue   Daily Goals Checklist  Pain/Anxiety/Delirium protocol (if indicated): PRN fent  VAP protocol (if indicated): yes DVT prophylaxis: lovenox  Nutrition Status: EN , FWF  GI prophylaxis: PPI  Glucose control: monitor Code Status: Limited Family Communication: 6/8 Disposition: ICU   Labs   CBC: Recent Labs  Lab 10/18/19 1436 10/19/19 0511 10/21/19 0709 10/22/19 0353  10/22/19 0446 10/23/19 0425 10/24/19 0414 10/24/19 0559 10/25/19 0615  WBC 17.0*   < > 21.6*  --  28.5* 20.6*  --  17.9* 18.1*  NEUTROABS 12.9*  --   --   --   --   --   --  14.2*  --   HGB 13.3   < > 11.1*   < > 11.9* 11.6* 10.2* 10.6* 10.4*  HCT 41.1   < > 34.9*   < > 36.6* 35.5* 30.0* 33.0* 32.5*  MCV 93.6   < > 94.6  --  94.6 94.9  --  94.3 95.0  PLT 676*   < > 637*  --  682* 591*  --  551* 520*   < > = values in this interval not displayed.    Basic Metabolic Panel: Recent Labs  Lab 10/21/19 0709 10/22/19 0353 10/22/19 0446 10/23/19 0425 10/24/19 0414 10/24/19 0559 10/25/19 0615  NA 149*   < > 150* 148* 150* 148* 151*  K 3.8   < > 4.8  5.1 3.4* 3.7 4.1  CL 120*  --  120* 118*  --  119* 120*  CO2 18*  --  19* 21*  --  19* 21*  GLUCOSE 192*  --  146* 124*  --  120* 131*  BUN 51*  --  40* 38*  --  30* 28*  CREATININE 1.58*  --  1.42* 1.33*  --  1.17 1.11  CALCIUM 8.1*  --  8.0* 7.9*  --  8.2* 8.1*   < > = values in this interval not displayed.   GFR: Estimated Creatinine Clearance: 62 mL/min (by C-G formula based on SCr of 1.11 mg/dL). Recent Labs  Lab 10/22/19 0446 10/23/19 0425 10/24/19 0559 10/25/19 0615  WBC 28.5* 20.6* 17.9* 18.1*    Liver Function Tests: Recent Labs  Lab 10/19/19 0511 10/22/19 0446  AST 169* 115*  ALT 414* 236*  ALKPHOS 135* 126  BILITOT 0.7 0.9  PROT 7.4 5.8*  ALBUMIN 2.4* 1.7*   No results for input(s): LIPASE, AMYLASE in the last 168 hours. No results for input(s): AMMONIA in the last 168 hours.  ABG    Component Value Date/Time   PHART 7.469 (H) 10/24/2019 0414   PCO2ART 27.8 (L) 10/24/2019 0414   PO2ART 138 (H) 10/24/2019 0414   HCO3 20.0 10/24/2019 0414   TCO2 21 (L) 10/24/2019 0414   ACIDBASEDEF 3.0 (H) 10/24/2019 0414   O2SAT 99.0 10/24/2019 0414     Coagulation Profile: Recent Labs  Lab 10/20/19 1019  INR 1.4*    Cardiac Enzymes: No results for input(s): CKTOTAL, CKMB, CKMBINDEX, TROPONINI in the last 168 hours.  HbA1C: Hgb A1c MFr Bld  Date/Time Value Ref Range Status  10/03/2019 02:15 AM 5.9 (H) 4.8 - 5.6 % Final    Comment:    (NOTE)         Prediabetes: 5.7 - 6.4         Diabetes: >6.4         Glycemic control for adults with diabetes: <7.0     CBG: Recent Labs  Lab 10/24/19 1147 10/24/19 1528 10/24/19 1942 10/24/19 2318 10/25/19 0321  GLUCAP 137* 123* 139* 129* 127*    Critical care time: The patient is critically ill with multiple organ systems failure and requires high complexity decision making for assessment and support, frequent evaluation and titration of therapies, application of advanced monitoring technologies and extensive  interpretation of multiple databases.  Critical care time 40 mins.  This represents my time independent of the NPs time taking care of the pt. This is excluding procedures.    [facility] 10/25/2019, 7:34 AM

## 2019-10-25 NOTE — Progress Notes (Signed)
Physical Therapy Treatment Patient Details Name: Riley Wagner MRN: [id number] DOB: 26-Feb-1956 Today's Date: 10/25/2019    History of Present Illness 64 y.o. male with a history of hypertension who was last known well prior to bed 5/15. He lost conciousness last night and fell, but refused transport with BP of 277 systolic and negative stroke screen by EMS. On 5/16 pt with R weakness and apahasia on awakening. Pt found to have large penumbra on CT and taken for emergent IR thrombectomy of L MCA followed by L ICA coil embolization 2/2 bleeding. Pt extubated on 5/21. PEG placed 5/28, re-intubated 6/2 and tracheostomy performed 6/3.    PT Comments    Patient progressing very slowly, but tolerating longer sitting at EOB and able to attend a few seconds to face and item at midline with cues.  Goals assessed and updated this session with pt unable to achieve goals though sitting balance is improving.  Still appropriate for SNF level rehab upon d/c.  PT to follow acutely.   Follow Up Recommendations  SNF;Supervision/Assistance - 24 hour     Equipment Recommendations  Wheelchair (measurements PT);Wheelchair cushion (measurements PT);Hospital bed    Recommendations for Other Services       Precautions / Restrictions Precautions Precautions: Fall Precaution Comments: AJ<287 systolically; R HP, trach, PEG    Mobility  Bed Mobility Overal bed mobility: Needs Assistance Bed Mobility: Rolling;Sidelying to Sit;Sit to Supine Rolling: Max assist;+2 for safety/equipment Sidelying to sit: Max assist;+2 for physical assistance;HOB elevated   Sit to supine: Max assist;+2 for physical assistance   General bed mobility comments: rolling to L with A for R UE/LE, assist for legs off bed and to lift trunk, sit to supine assist for legs and trunk and for positioning in supine  Transfers                 General transfer comment: NT  Ambulation/Gait                 Stairs              Wheelchair Mobility    Modified Rankin (Stroke Patients Only) Modified Rankin (Stroke Patients Only) Pre-Morbid Rankin Score: No symptoms Modified Rankin: Severe disability     Balance Overall balance assessment: Needs assistance Sitting-balance support: Feet supported;No upper extremity supported Sitting balance-Leahy Scale: Poor Sitting balance - Comments: min to mod A overall at EOB, sitting about 12 minutes; at one point sat briefly with S, then posterior LOB max A recovery; seated to perform trunk flexion, rotation and cervical rotation, attempting to engage in R side attention with max multimodal cues and assist to turn his head                                    Cognition Arousal/Alertness: Awake/alert Behavior During Therapy: Flat affect Overall Cognitive Status: Difficult to assess Area of Impairment: Safety/judgement;Following commands;Awareness;Problem solving                   Current Attention Level: Focused   Following Commands: Follows one step commands inconsistently Safety/Judgement: Decreased awareness of safety;Decreased awareness of deficits   Problem Solving: Slow processing;Decreased initiation;Requires verbal cues;Requires tactile cues        Exercises Other Exercises Other Exercises: PROM R UE/LE with stretch to hamstrings heel cords, AROM L UE, PROM R LE    General Comments General comments (skin integrity, edema, etc.): on  vent via trach PS/CPAP 40% FiO2 PEEP 5      Pertinent Vitals/Pain Faces Pain Scale: No hurt    Home Living                      Prior Function            PT Goals (current goals can now be found in the care plan section) Acute Rehab PT Goals Patient Stated Goal: none stated PT Goal Formulation: Patient unable to participate in goal setting Time For Goal Achievement: 11/08/19 Potential to Achieve Goals: Fair Progress towards PT goals: Progressing toward goals    Frequency     Min 3X/week      PT Plan Current plan remains appropriate    Co-evaluation              AM-PAC PT "6 Clicks" Mobility   Outcome Measure  Help needed turning from your back to your side while in a flat bed without using bedrails?: Total Help needed moving from lying on your back to sitting on the side of a flat bed without using bedrails?: Total Help needed moving to and from a bed to a chair (including a wheelchair)?: Total Help needed standing up from a chair using your arms (e.g., wheelchair or bedside chair)?: Total Help needed to walk in hospital room?: Total Help needed climbing 3-5 steps with a railing? : Total 6 Click Score: 6    End of Session Equipment Utilized During Treatment: Other (comment)(vent) Activity Tolerance: Patient limited by fatigue Patient left: in bed;with call bell/phone within reach;with bed alarm set   PT Visit Diagnosis: Other abnormalities of gait and mobility (R26.89);Muscle weakness (generalized) (M62.81);Hemiplegia and hemiparesis;Other symptoms and signs involving the nervous system (R29.898) Hemiplegia - Right/Left: Right Hemiplegia - dominant/non-dominant: (unknown) Hemiplegia - caused by: Cerebral infarction     Time: 0915-0949 PT Time Calculation (min) (ACUTE ONLY): 34 min  Charges:  $Therapeutic Exercise: 8-22 mins $Therapeutic Activity: 8-22 mins                     Magda Kiel, PT Acute Rehabilitation Services [phone] 10/25/2019    Reginia Naas 10/25/2019, 2:07 PM

## 2019-10-26 ENCOUNTER — Other Ambulatory Visit (HOSPITAL_COMMUNITY): Payer: Medicare Other

## 2019-10-26 ENCOUNTER — Inpatient Hospital Stay
Admission: RE | Admit: 2019-10-26 | Discharge: 2019-11-29 | Disposition: A | Discharge status: Skilled Nursing Facility | Payer: Medicare Other | Source: Ambulatory Visit | Attending: Internal Medicine | Admitting: Internal Medicine

## 2019-10-26 DIAGNOSIS — J9811 Atelectasis: Secondary | ICD-10-CM | POA: Diagnosis present

## 2019-10-26 DIAGNOSIS — K567 Ileus, unspecified: Secondary | ICD-10-CM | POA: Diagnosis not present

## 2019-10-26 DIAGNOSIS — R1310 Dysphagia, unspecified: Secondary | ICD-10-CM | POA: Diagnosis not present

## 2019-10-26 DIAGNOSIS — K8050 Calculus of bile duct without cholangitis or cholecystitis without obstruction: Secondary | ICD-10-CM

## 2019-10-26 DIAGNOSIS — J9690 Respiratory failure, unspecified, unspecified whether with hypoxia or hypercapnia: Secondary | ICD-10-CM

## 2019-10-26 DIAGNOSIS — J9621 Acute and chronic respiratory failure with hypoxia: Secondary | ICD-10-CM | POA: Diagnosis present

## 2019-10-26 DIAGNOSIS — Z20822 Contact with and (suspected) exposure to covid-19: Secondary | ICD-10-CM | POA: Diagnosis not present

## 2019-10-26 DIAGNOSIS — A414 Sepsis due to anaerobes: Secondary | ICD-10-CM | POA: Diagnosis not present

## 2019-10-26 DIAGNOSIS — I10 Essential (primary) hypertension: Secondary | ICD-10-CM | POA: Diagnosis not present

## 2019-10-26 DIAGNOSIS — I69298 Other sequelae of other nontraumatic intracranial hemorrhage: Secondary | ICD-10-CM | POA: Diagnosis not present

## 2019-10-26 DIAGNOSIS — Z6826 Body mass index (BMI) 26.0-26.9, adult: Secondary | ICD-10-CM | POA: Diagnosis not present

## 2019-10-26 DIAGNOSIS — R509 Fever, unspecified: Secondary | ICD-10-CM

## 2019-10-26 DIAGNOSIS — J9601 Acute respiratory failure with hypoxia: Secondary | ICD-10-CM | POA: Diagnosis not present

## 2019-10-26 DIAGNOSIS — N182 Chronic kidney disease, stage 2 (mild): Secondary | ICD-10-CM | POA: Diagnosis present

## 2019-10-26 DIAGNOSIS — I129 Hypertensive chronic kidney disease with stage 1 through stage 4 chronic kidney disease, or unspecified chronic kidney disease: Secondary | ICD-10-CM | POA: Diagnosis present

## 2019-10-26 DIAGNOSIS — J9600 Acute respiratory failure, unspecified whether with hypoxia or hypercapnia: Secondary | ICD-10-CM | POA: Diagnosis not present

## 2019-10-26 DIAGNOSIS — K560 Paralytic ileus: Secondary | ICD-10-CM | POA: Diagnosis not present

## 2019-10-26 DIAGNOSIS — Z431 Encounter for attention to gastrostomy: Secondary | ICD-10-CM | POA: Diagnosis not present

## 2019-10-26 DIAGNOSIS — G9349 Other encephalopathy: Secondary | ICD-10-CM | POA: Diagnosis present

## 2019-10-26 DIAGNOSIS — Z87891 Personal history of nicotine dependence: Secondary | ICD-10-CM | POA: Diagnosis not present

## 2019-10-26 DIAGNOSIS — L89890 Pressure ulcer of other site, unstageable: Secondary | ICD-10-CM | POA: Diagnosis not present

## 2019-10-26 DIAGNOSIS — I6522 Occlusion and stenosis of left carotid artery: Secondary | ICD-10-CM | POA: Diagnosis not present

## 2019-10-26 DIAGNOSIS — E119 Type 2 diabetes mellitus without complications: Secondary | ICD-10-CM | POA: Diagnosis not present

## 2019-10-26 DIAGNOSIS — I69320 Aphasia following cerebral infarction: Secondary | ICD-10-CM | POA: Diagnosis not present

## 2019-10-26 DIAGNOSIS — I69151 Hemiplegia and hemiparesis following nontraumatic intracerebral hemorrhage affecting right dominant side: Secondary | ICD-10-CM | POA: Diagnosis not present

## 2019-10-26 DIAGNOSIS — E43 Unspecified severe protein-calorie malnutrition: Secondary | ICD-10-CM | POA: Diagnosis present

## 2019-10-26 DIAGNOSIS — B961 Klebsiella pneumoniae [K. pneumoniae] as the cause of diseases classified elsewhere: Secondary | ICD-10-CM | POA: Diagnosis not present

## 2019-10-26 DIAGNOSIS — G9340 Encephalopathy, unspecified: Secondary | ICD-10-CM | POA: Diagnosis not present

## 2019-10-26 DIAGNOSIS — R7881 Bacteremia: Secondary | ICD-10-CM | POA: Diagnosis not present

## 2019-10-26 DIAGNOSIS — I63512 Cerebral infarction due to unspecified occlusion or stenosis of left middle cerebral artery: Secondary | ICD-10-CM | POA: Diagnosis not present

## 2019-10-26 DIAGNOSIS — I69891 Dysphagia following other cerebrovascular disease: Secondary | ICD-10-CM | POA: Diagnosis not present

## 2019-10-26 DIAGNOSIS — Z931 Gastrostomy status: Secondary | ICD-10-CM | POA: Diagnosis not present

## 2019-10-26 DIAGNOSIS — Z4682 Encounter for fitting and adjustment of non-vascular catheter: Secondary | ICD-10-CM | POA: Diagnosis not present

## 2019-10-26 DIAGNOSIS — L89616 Pressure-induced deep tissue damage of right heel: Secondary | ICD-10-CM | POA: Diagnosis present

## 2019-10-26 DIAGNOSIS — E46 Unspecified protein-calorie malnutrition: Secondary | ICD-10-CM | POA: Diagnosis not present

## 2019-10-26 DIAGNOSIS — L02414 Cutaneous abscess of left upper limb: Secondary | ICD-10-CM | POA: Diagnosis not present

## 2019-10-26 DIAGNOSIS — L89156 Pressure-induced deep tissue damage of sacral region: Secondary | ICD-10-CM | POA: Diagnosis present

## 2019-10-26 DIAGNOSIS — L89626 Pressure-induced deep tissue damage of left heel: Secondary | ICD-10-CM | POA: Diagnosis present

## 2019-10-26 DIAGNOSIS — I69391 Dysphagia following cerebral infarction: Secondary | ICD-10-CM | POA: Diagnosis not present

## 2019-10-26 DIAGNOSIS — I69198 Other sequelae of nontraumatic intracerebral hemorrhage: Secondary | ICD-10-CM | POA: Diagnosis not present

## 2019-10-26 DIAGNOSIS — I69351 Hemiplegia and hemiparesis following cerebral infarction affecting right dominant side: Secondary | ICD-10-CM | POA: Diagnosis not present

## 2019-10-26 DIAGNOSIS — H109 Unspecified conjunctivitis: Secondary | ICD-10-CM | POA: Diagnosis not present

## 2019-10-26 DIAGNOSIS — R531 Weakness: Secondary | ICD-10-CM | POA: Diagnosis present

## 2019-10-26 DIAGNOSIS — I63542 Cerebral infarction due to unspecified occlusion or stenosis of left cerebellar artery: Secondary | ICD-10-CM | POA: Diagnosis not present

## 2019-10-26 DIAGNOSIS — Z66 Do not resuscitate: Secondary | ICD-10-CM | POA: Diagnosis not present

## 2019-10-26 DIAGNOSIS — I69120 Aphasia following nontraumatic intracerebral hemorrhage: Secondary | ICD-10-CM | POA: Diagnosis not present

## 2019-10-26 DIAGNOSIS — I639 Cerebral infarction, unspecified: Secondary | ICD-10-CM | POA: Diagnosis not present

## 2019-10-26 DIAGNOSIS — I69191 Dysphagia following nontraumatic intracerebral hemorrhage: Secondary | ICD-10-CM | POA: Diagnosis not present

## 2019-10-26 DIAGNOSIS — D473 Essential (hemorrhagic) thrombocythemia: Secondary | ICD-10-CM | POA: Diagnosis not present

## 2019-10-26 HISTORY — DX: Atelectasis: J98.11

## 2019-10-26 HISTORY — DX: Chronic kidney disease, stage 2 (mild): N18.2

## 2019-10-26 HISTORY — DX: Acute and chronic respiratory failure with hypoxia: J96.21

## 2019-10-26 HISTORY — DX: Cerebral infarction due to unspecified occlusion or stenosis of left middle cerebral artery: I63.512

## 2019-10-26 HISTORY — DX: Other sequelae of other nontraumatic intracranial hemorrhage: I69.298

## 2019-10-26 LAB — BASIC METABOLIC PANEL
Anion gap: 9 (ref 5–15)
BUN: 22 mg/dL (ref 8–23)
CO2: 21 mmol/L — ABNORMAL LOW (ref 22–32)
Calcium: 7.9 mg/dL — ABNORMAL LOW (ref 8.9–10.3)
Chloride: 116 mmol/L — ABNORMAL HIGH (ref 98–111)
Creatinine, Ser: 1.05 mg/dL (ref 0.61–1.24)
GFR calc Af Amer: >60 mL/min (ref >60)
GFR calc non Af Amer: >60 mL/min (ref >60)
Glucose, Bld: 132 mg/dL — ABNORMAL HIGH (ref 70–99)
Potassium: 3.6 mmol/L (ref 3.5–5.1)
Sodium: 146 mmol/L — ABNORMAL HIGH (ref 135–145)

## 2019-10-26 LAB — GLUCOSE, CAPILLARY
Glucose-Capillary: 117 mg/dL — ABNORMAL HIGH (ref 70–99)
Glucose-Capillary: 144 mg/dL — ABNORMAL HIGH (ref 70–99)
Glucose-Capillary: 128 mg/dL — ABNORMAL HIGH (ref 70–99)
Glucose-Capillary: 131 mg/dL — ABNORMAL HIGH (ref 70–99)

## 2019-10-26 LAB — CBC
HCT: 31.5 % — ABNORMAL LOW (ref 39.0–52.0)
Hemoglobin: 10.2 g/dL — ABNORMAL LOW (ref 13.0–17.0)
MCH: 30.6 pg (ref 26.0–34.0)
MCHC: 32.4 g/dL (ref 30.0–36.0)
MCV: 94.6 fL (ref 80.0–100.0)
Platelets: 507 10*3/uL — ABNORMAL HIGH (ref 150–400)
RBC: 3.33 MIL/uL — ABNORMAL LOW (ref 4.22–5.81)
RDW: 15.5 % (ref 11.5–15.5)
WBC: 15.2 10*3/uL — ABNORMAL HIGH (ref 4.0–10.5)
nRBC: 0.2 % (ref 0.0–0.2)

## 2019-10-26 LAB — URINALYSIS, ROUTINE W REFLEX MICROSCOPIC
Bilirubin Urine: NEGATIVE
Glucose, UA: NEGATIVE mg/dL
Hgb urine dipstick: NEGATIVE
Ketones, ur: NEGATIVE mg/dL
Leukocytes,Ua: NEGATIVE
Nitrite: NEGATIVE
Protein, ur: 30 mg/dL — AB
Specific Gravity, Urine: 1.025 (ref 1.005–1.030)
pH: 5 (ref 5.0–8.0)

## 2019-10-26 LAB — CULTURE, BLOOD (ROUTINE X 2)
Culture: NO GROWTH
Special Requests: ADEQUATE

## 2019-10-26 LAB — MAGNESIUM: Magnesium: 2.3 mg/dL (ref 1.7–2.4)

## 2019-10-26 LAB — PROCALCITONIN: Procalcitonin: 0.89 ng/mL

## 2019-10-26 MED ORDER — PANTOPRAZOLE SODIUM 40 MG PO PACK: 40.0000 mg | PACK | Freq: Every day | ORAL | 0 refills | Status: AC

## 2019-10-26 MED ORDER — ADULT MULTIVITAMIN W/MINERALS CH: 1.0000 | ORAL_TABLET | Freq: Every day | ORAL | 0 refills | Status: AC

## 2019-10-26 MED ORDER — OSMOLITE 1.2 CAL PO LIQD: 1000.0000 mL | ORAL | 0 refills | Status: AC

## 2019-10-26 MED ORDER — AMANTADINE HCL 50 MG/5ML PO SYRP: 100.0000 mg | ORAL_SOLUTION | Freq: Two times a day (BID) | ORAL | 0 refills | Status: AC

## 2019-10-26 MED ORDER — CLOPIDOGREL BISULFATE 75 MG PO TABS: 75.0000 mg | ORAL_TABLET | Freq: Every day | ORAL | 0 refills | Status: AC

## 2019-10-26 MED ORDER — PIPERACILLIN-TAZOBACTAM 3.375 G IVPB
3.3750 g | Freq: Three times a day (TID) | INTRAVENOUS | Status: DC
  Administered 2019-10-26: 3.375 g via INTRAVENOUS
  Filled 2019-10-26: qty 50

## 2019-10-26 MED ORDER — PIPERACILLIN-TAZOBACTAM 3.375 G IVPB: 3.3750 g | Freq: Three times a day (TID) | INTRAVENOUS | 0 refills | Status: AC

## 2019-10-26 MED ORDER — OSMOLITE 1.2 CAL PO LIQD
1000.0000 mL | ORAL | Status: DC
  Administered 2019-10-26: 1000 mL
  Filled 2019-10-26 (×2): qty 1000

## 2019-10-26 MED ORDER — ENOXAPARIN SODIUM 40 MG/0.4ML ~~LOC~~ SOLN: 40.0000 mg | Freq: Every day | SUBCUTANEOUS | 0 refills | Status: AC

## 2019-10-26 MED ORDER — FREE WATER: 250.0000 mL | 0 refills | Status: AC

## 2019-10-26 MED ORDER — ASPIRIN 325 MG PO TABS: 325.0000 mg | ORAL_TABLET | Freq: Every day | ORAL | 0 refills | Status: AC

## 2019-10-26 MED ORDER — IOPAMIDOL (ISOVUE-300) INJECTION 61%
30.0000 mL | Freq: Once | INTRAVENOUS | Status: AC | PRN
  Administered 2019-10-26: 30 mL

## 2019-10-26 NOTE — TOC Transition Note (Signed)
Transition of Care [facility]) - CM/SW Discharge Note   Patient Details  Name: Riley Wagner MRN: [id number] Date of Birth: 1955-10-18  [facility] CM/SW Contact:  Ella Bodo, RN Phone Number: 10/26/2019, 4:26 PM   Clinical Narrative:  Pt appropriate for discharge to [facility] hospital today, per attending MD, and family agreeable to transfer.  Pt going to Room 5E07 under care of Dr. Satira Sark.  Bedside nurse to call report to [phone].     Final Next Level of Care:  [facility]  Barriers to Discharge: Barriers Resolved   Patient Goals and CMS Choice   CMS Medicare.gov Compare Post Acute Care list provided to:: Patient Represenative (must comment) Choice offered to / list presented to : Adult Children                         Discharge Plan and Services   Discharge Planning Services: CM Consult Post Acute Care Choice: Long Term Acute Care (LTAC)                               Social Determinants of Health (SDOH) Interventions     Readmission Risk Interventions No flowsheet data found.  Reinaldo Raddle, RN, BSN  Trauma/Neuro ICU Case Manager [phone]

## 2019-10-26 NOTE — Discharge Summary (Signed)
Physician Discharge Summary  Patient ID: Riley Wagner MRN: [id number] DOB/AGE: 1955/08/11 64 y.o.  Admit date: 10/02/2019 Discharge date: 10/26/2019  Admission Diagnoses: hypertensive crisis   Discharge Diagnoses:  Active Problems:   Status post stroke   Acute embolic stroke ([place])   Acute respiratory failure ([place])   Hypertensive crisis   Endotracheal tube present   Discharged Condition: guarded  Hospital Course: Riley Wagner is a 64 yo black male admitted on 5/16 with hypertensive crisis, aphasia and R weakness. He was found to have L ICA occulusion s/p thrombectomy with subsequent L sided intraparenchymal bleed along (when reviewed original angiographic run was there prior to intervention as well) the posterior limb of the internal capsule as well as R sided intraparenchymal bleed. Decision was made to embolize the L ICA terminus.   Pt was admitted to [facility] for a total of 24 days of which 2 he was under my care. Please see progress notes for complete details. However briefly during his stay here neurology/trauma and ccm were consulted. He was successfully extubated on 5.21 but on 6/2 deteriorated requiring transfer to ICU for intubation. He received a trach (6/3) and peg (5/28) and has been slowly weaning on PST from a ventilator standpoint and tolerating tube feeds. He has completed a course of abx empiric therapy stopping on 6/5 and since then has developed some lgt that spiked today to 102 despite improving wbc. I ordered pct and pancx which are pending and started zosyn (recently finished cefepime) to broaden coverage.   His neuro exam and mental status have remained relatively stable for the most recent history, aphasic, purposeful with L but not following commands and flaccid R.   He is to cont DAPT x80mo and then transition to aspirin along Consults: pulmonary/intensive care, neurology and trauma for peg placement  Significant Diagnostic Studies: microbiology: blood culture: pending  6/9, sputum culture: pending 6/9 and urine culture: pending 6/9  Treatments: antibiotics: Zosyn  Discharge Exam: Blood pressure 126/80, pulse 91, temperature 100.1 F (37.8 C), temperature source Axillary, resp. rate 18, height 5\' 7"  (1.702 m), weight 77.5 kg, SpO2 100 %. See today's progress note  Disposition:  To LTAC for cont close monitoring and vent wean.   Allergies as of 10/26/2019   No Known Allergies     Medication List    STOP taking these medications   acetaminophen 500 MG tablet Commonly known as: TYLENOL   amLODipine 10 MG tablet Commonly known as: NORVASC   aspirin EC 81 MG tablet Replaced by: aspirin 325 MG tablet   atorvastatin 40 MG tablet Commonly known as: LIPITOR   carvedilol 3.125 MG tablet Commonly known as: COREG   cyclobenzaprine 5 MG tablet Commonly known as: Flexeril   hydrochlorothiazide 25 MG tablet Commonly known as: HYDRODIURIL   lisinopril 40 MG tablet Commonly known as: ZESTRIL     TAKE these medications   amantadine 50 MG/5ML solution Commonly known as: SYMMETREL Place 10 mLs (100 mg total) into feeding tube 2 (two) times daily. Start taking on: October 27, 2019   aspirin 325 MG tablet Place 1 tablet (325 mg total) into feeding tube daily. Start taking on: October 27, 2019 Replaces: aspirin EC 81 MG tablet   clopidogrel 75 MG tablet Commonly known as: PLAVIX Place 1 tablet (75 mg total) into feeding tube daily. Start taking on: October 27, 2019   enoxaparin 40 MG/0.4ML injection Commonly known as: LOVENOX Inject 0.4 mLs (40 mg total) into the skin daily. Start taking on: October 27, 2019   feeding supplement (OSMOLITE 1.2 CAL) Liqd Place 1,000 mLs into feeding tube continuous.   free water Soln Place 250 mLs into feeding tube every 3 (three) hours.   multivitamin with minerals Tabs tablet Place 1 tablet into feeding tube daily. Start taking on: October 27, 2019   pantoprazole sodium 40 mg/20 mL Pack Commonly known as:  PROTONIX Place 20 mLs (40 mg total) into feeding tube daily. Start taking on: October 27, 2019   piperacillin-tazobactam 3.375 GM/50ML IVPB Commonly known as: ZOSYN Inject 50 mLs (3.375 g total) into the vein every 8 (eight) hours.        Signed: Audria Nine 10/26/2019, 3:23 PM

## 2019-10-26 NOTE — Discharge Instructions (Signed)
Tf at 75ml/hr with fwf as ordered.

## 2019-10-26 NOTE — TOC Progression Note (Signed)
Transition of Care [facility]) - Progression Note    Patient Details  Name: Riley Wagner MRN: [id number] Date of Birth: [birth year]  [facility]) CM/SW Contact  Ella Bodo, RN Phone Number: 10/26/2019, 1100 Clinical Narrative:   Bed available today at [place] after 4pm per admissions coordinator.  Bedside nurse to check with attending MD to see if pt appropriate for discharge today.      Expected Discharge Plan: Long Term Acute Care (LTAC) Barriers to Discharge: Barriers Resolved  Expected Discharge Plan and Services Expected Discharge Plan: Long Term Acute Care (LTAC)   Discharge Planning Services: CM Consult Post Acute Care Choice: Long Term Acute Care (LTAC) Living arrangements for the past 2 months: Single Family Home Expected Discharge Date: 10/26/19                                     Social Determinants of Health (SDOH) Interventions    Readmission Risk Interventions No flowsheet data found.  Reinaldo Raddle, RN, BSN  Trauma/Neuro ICU Case Manager [phone]

## 2019-10-26 NOTE — Progress Notes (Signed)
Physical Therapy Treatment Patient Details Name: Riley Wagner MRN: [id number] DOB: 1955-12-03 Today's Date: 10/26/2019    History of Present Illness 64 y.o. male with a history of hypertension who was last known well prior to bed 5/15. He lost conciousness last night and fell, but refused transport with BP of 409 systolic and negative stroke screen by EMS. On 5/16 pt with R weakness and apahasia on awakening. Pt found to have large penumbra on CT and taken for emergent IR thrombectomy of L MCA followed by L ICA coil embolization 2/2 bleeding. Pt extubated on 5/21. PEG placed 5/28, re-intubated 6/2 and tracheostomy performed 6/3.    PT Comments    Patient limited to EOB again due to frequent stooling and possible transfer to [facility] today.  He was able to attend briefly to flashlight held at midline with max cues.  He is restless with the L UE and distracted unable to utilize for balance.  Needs constant supervision/assist when not wearing mitt to avoid pulling vent tubing.  Patient remains appropriate for LTACH/SNF with PT follow up.  Will follow if not d/c.    Follow Up Recommendations  SNF;Supervision/Assistance - 24 hour     Equipment Recommendations  Wheelchair (measurements PT);Wheelchair cushion (measurements PT);Hospital bed    Recommendations for Other Services       Precautions / Restrictions Precautions Precautions: Fall Precaution Comments: WJ<191 systolically; R HP, trach, PEG    Mobility  Bed Mobility Overal bed mobility: Needs Assistance Bed Mobility: Rolling;Sidelying to Sit;Sit to Supine Rolling: Max assist;+2 for physical assistance Sidelying to sit: Total assist;+2 for physical assistance   Sit to supine: Total assist;+2 for physical assistance   General bed mobility comments: rolling for hygiene prior to EOB due to stooling constantly and no flexiseal, patient not initiating to use L UE to assist; side to sit with A for trunk and legs, to supine assist for trunk  and legs  Transfers                 General transfer comment: NT, was going to try to chair, but RN reports likely to [facility] today and now without flexiseal  Ambulation/Gait                 Stairs             Wheelchair Mobility    Modified Rankin (Stroke Patients Only) Modified Rankin (Stroke Patients Only) Pre-Morbid Rankin Score: No symptoms Modified Rankin: Severe disability     Balance Overall balance assessment: Needs assistance Sitting-balance support: Feet supported Sitting balance-Leahy Scale: Poor Sitting balance - Comments: sat EOB about 10 minutes working on sitting balance, R side awareness, midline focus for oject placed in L hand assist for R hand on flashlight. Postural control: Posterior lean                                  Cognition Arousal/Alertness: Awake/alert Behavior During Therapy: Flat affect Overall Cognitive Status: Impaired/Different from baseline Area of Impairment: Attention;Following commands;Safety/judgement                   Current Attention Level: Focused   Following Commands: Follows one step commands inconsistently;Follows one step commands with increased time Safety/Judgement: Decreased awareness of safety;Decreased awareness of deficits   Problem Solving: Slow processing;Decreased initiation;Difficulty sequencing;Requires verbal cues;Requires tactile cues        Exercises      General Comments  General comments (skin integrity, edema, etc.): on vent via trach PRVC 40% FiO2 PEEP 5      Pertinent Vitals/Pain Faces Pain Scale: No hurt    Home Living                      Prior Function            PT Goals (current goals can now be found in the care plan section) Progress towards PT goals: Progressing toward goals(very slowly)    Frequency    Min 3X/week      PT Plan Current plan remains appropriate    Co-evaluation PT/OT/SLP Co-Evaluation/Treatment:  Yes Reason for Co-Treatment: For patient/therapist safety;To address functional/ADL transfers;Complexity of the patient's impairments (multi-system involvement) PT goals addressed during session: Mobility/safety with mobility;Balance;Other (comment)(R side awareness)        AM-PAC PT "6 Clicks" Mobility   Outcome Measure  Help needed turning from your back to your side while in a flat bed without using bedrails?: Total Help needed moving from lying on your back to sitting on the side of a flat bed without using bedrails?: Total Help needed moving to and from a bed to a chair (including a wheelchair)?: Total Help needed standing up from a chair using your arms (e.g., wheelchair or bedside chair)?: Total Help needed to walk in hospital room?: Total Help needed climbing 3-5 steps with a railing? : Total 6 Click Score: 6    End of Session Equipment Utilized During Treatment: Other (comment)(vent) Activity Tolerance: Patient tolerated treatment well Patient left: in bed;with call bell/phone within reach Nurse Communication: Mobility status;Other (comment)(cleaned after session, but still frequent stooling) PT Visit Diagnosis: Other abnormalities of gait and mobility (R26.89);Muscle weakness (generalized) (M62.81);Hemiplegia and hemiparesis;Other symptoms and signs involving the nervous system (R29.898) Hemiplegia - Right/Left: Right Hemiplegia - dominant/non-dominant: (unknown)     Time: 3567-0141 PT Time Calculation (min) (ACUTE ONLY): 32 min  Charges:  $Therapeutic Activity: 8-22 mins                     Magda Kiel, PT Acute Rehabilitation Services Pager:[phone] Office:[phone] 10/26/2019    Reginia Naas 10/26/2019, 2:35 PM

## 2019-10-26 NOTE — Progress Notes (Signed)
Pharmacy Antibiotic Note  Riley Wagner is a 64 y.o. male admitted on 10/02/2019 with hypertensive crisis, aphasia, and right sided weakness. Was found to have left terminal ICA occlusion and IP bleed along posterior limb of IC as well as rt sided bleed. S/p embolization of ICA terminus. Now with temp to 102.2. Recently completed a course of cefepime on 6/4 for leukocytosis and fever with concern for PNA. Pharmacy has been consulted for Zosyn dosing for empiric coverage with unknown source for infection.   Plan: Zosyn 3.375g IV q8h (4 hour infusion) x 3 days per MD Follow cultures and renal function  Height: 5\' 7"  (170.2 cm) Weight: 77.5 kg (170 lb 13.7 oz) IBW/kg (Calculated) : 66.1  Temp (24hrs), Avg:99.6 F (37.6 C), Min:97.3 F (36.3 C), Max:102.2 F (39 C)  Recent Labs  Lab 10/22/19 0446 10/23/19 0425 10/24/19 0559 10/25/19 0615 10/26/19 0519  WBC 28.5* 20.6* 17.9* 18.1* 15.2*  CREATININE 1.42* 1.33* 1.17 1.11 1.05    Estimated Creatinine Clearance: 65.6 mL/min (by C-G formula based on SCr of 1.05 mg/dL).    No Known Allergies  Antimicrobials this admission: Zosyn 6/9>> Cefepime 5/29>> 6/4 Vanc 5/29>>5/31 Unasyn 5/22 >>5/29 Cipro eye drops 5/21 >> 5/26  Microbiology results: 5/16 COVID: neg 5/16 MRSA PCR: neg 5/18 TA: normal flora 5/22 BCx: neg 5/29 TA: normal flora 5/29 BCx: neg 6/4 BCx: 1 of 2 coag neg staph (contaminant) 6/5 TA: normal flora 6/9 BCx: sent 6/9 TA: sent   Thank you for allowing pharmacy to be a part of this patient's care.  Vertis Kelch, PharmD, [facility] PGY2 Cardiology Pharmacy Resident Phone [phone] 10/26/2019       11:49 AM  Please check [email or web address] for unit-specific pharmacist phone numbers

## 2019-10-26 NOTE — Progress Notes (Signed)
NAME:  Riley Wagner, MRN:  [id number], DOB:  1956-01-13, LOS: 24 ADMISSION DATE:  10/02/2019, CONSULTATION DATE:  10/02/2019 REFERRING MD:  Dr. Debbrah Alar, neuro IR, CHIEF COMPLAINT:  Altered mental status   Brief History   64 y/o male former smoker brought to ER with hypertensive crisis (BP 212/110), Rt sided weakness and aphasia.  Found to have Lt terminal ICA occlusion.  Intubated for airway protection. Noted to have intraparenchymal bleed along posterior limb of IC as well as Rt sided bleed.  Had embolization of Lt ICA terminus.  Past Medical History  HTN, [place] [facility]   5/16 Admit. To IR for embolization of Left ICA. Back to ICU intubated. BP goal 120-140 5/17 still hypertensive in spite of maximum dose Cleviprex.  Continues to exhibit right-sided hemiparesis 5/18 CT brain showing worsening midline shift and cerebral edema.  Still having difficulty with blood pressure control, requiring titration up of oral regimen.  Of note was made DO NOT RESUSCITATE by family on 5/17 5/20 bp under control. Moving all ext except RUE. Family; leaning towards attempt at extubation and trach if fails.  5/21 Extubated  5/28 Planning for placement of PEG today  6/02 Increased respiratory distress yesterday. Brought to ICU and intubated.  Hypotensive overnight.  6/5 aspiration event  Consults:  Neuro IR  Procedures:  ETT 5/16 >> 5/21   Significant Diagnostic Tests:   UDS 5/16 >> positive for cocaine, THC  CT head 5/16 >> acute cytotoxic edema in Lt insula and frontal operculum, extensive chronic small vessel ischemic changes  Echo 5/17 >> Left Ventricle: Left ventricular ejection fraction, by estimation, is 55 to 60%. The left ventricle has normal function. The left ventricle has no regional wall motion abnormalities. The left ventricular internal cavity size was normal in size. There is mild concentric left ventricular hypertrophy. Left ventricular diastolic parameters are  consistent with Grade II diastolic dysfunction (pseudonormalization). Elevated left ventricular end-diastolic pressure.  CT brain 5/18 >> small acute infarct in the right parietal cortex, no new hemorrhage, left MCA infarct with progressive cytotoxic edema/swelling in 6 mm midline shif  CT brain 5/21 >> Left more than right cerebral infarcts without progression from brain MRI 2 days ago. Cytotoxic edema causes 1 cm of rightward shift.2. No interval hemorrhage.  CT brain 6/1 >> unchanged. Note paranasal sinus disease, mucosal thickening/frothy secretions in right maxillary sinus  CXR 6/7> low lung volumes, bibasilar atelectatic changes. Tracheostomy tube in mid-trachea   Micro Data:  SARS CoV2 PCR 5/16 >> negative MRSA PCR 5/16 > negative Respiratory culture 5/18 >> nml flora  Blood culture 5/22 >> negative Tracheal aspirte 5/29 >> normal flora  BCx2 5/29 >> negative  UA 6/1 >> negative  Blood culture 6x4--1 out of 4, likely contaminant, CoNS resp cx 6/5: normal flora  Antimicrobials:  Unasyn 5/22 > 5/29 Cefepime 5/29 >6/5  Interim history/subjective:  6/9: Na down to 146 today with fwf, wbc to 18->15, tmax 100.7 6/8: sodium up today 151, wbc 18.1, tmax 101.5/24h mental status remains the same per chart review. Not following commands, tracks to verbal stim. On ps at this time.  6/7:Emesis overnight x1, EN held Temperature increasing, not yet febrile    Objective   Blood pressure 127/76, pulse 92, temperature 99.2 F (37.3 C), temperature source Axillary, resp. rate 19, height 5\' 7"  (1.702 m), weight 77.5 kg, SpO2 100 %.    Vent Mode: PSV;CPAP FiO2 (%):  [40 %] 40 % Set Rate:  [16 bmp] 16  bmp Vt Set:  [520 mL] 520 mL PEEP:  [5 cmH20] 5 cmH20 Pressure Support:  [12 cmH20-15 cmH20] 15 cmH20 Plateau Pressure:  [16 cmH20-18 cmH20] 17 cmH20   Intake/Output Summary (Last 24 hours) at 10/26/2019 0748 Last data filed at 10/26/2019 0500 Gross per 24 hour  Intake 3944.33 ml  Output  900 ml  Net 3044.33 ml   Filed Weights   10/24/19 0500 10/25/19 0500 10/26/19 0500  Weight: 77.9 kg 77.8 kg 77.5 kg   cxr 6/8: no acute disease, personally reviewed by me.   Examination: General: Chronically ill appearing older adult M, trach/vent, NAD  HEENT: NCAT pink mmm trach secure anicteric sclera but injected Neuro: Awake, tracking. Does not follow commands. Spontaneous L sided movement. Unable to examine L pupil 2/2 pt refusing to open eyelid CV: RRR s1s2 no rgm. Cap refil < 3 seconds  PULM: CTA bilaterally. No accessory muscle use on PSV.  GI: Soft, ndnt. bs + GU: Catheter with yellow urine  MSK: R hand brace. No obvious joint deformity. No cyanosis or clubbing  Skin: c/d/warm without rash   Resolved Hospital Problem list   Non-anion anion gap metabolic acidosis Acute hypoxic respiratory insufficiency with compromised airway-Extubated 5/21 Hypertensive emergency Therapeutic hypernatremia, mild hyperchloremic  Aspiration PNA  T-max 101.6  Assessment & Plan:   L MCA infarcts due to L M1 occlusion, s/p unsuccessful L MCA thrombectomy; L ICA large vessel disease R MCA infarcts in setting of cocaine abuse R ICA , R MCA large vessel stenoses Cerebral edema  -MRI brain showed evolving bilateral cerebral infarcts, large left MCA infarct, unchanged cytotoxic edema with 8 mm of rightward midline shift.  There is negative flow in the intracranial left ICA left MCA or left A1 segment P -post- CVA care per neuro  -prognosis for meaningful recovery is guarded, family wishes for more time to allow for possible recovery -Will likely need LTAC    Acute respiratory failure due to inability to protect airway  Recurrent aspiration Atelectasis -Had aspiration PNA, completed abx.  S/P trach 6/4.-trach care per protocol  -s/p course of cefepime, completed 6/5  -CXR 6/8 with left basilar infiltrate, atelectasis  P -Continue PSV wean as tolerated to ATC -cxr in am  -VAP bundle   -Routine trach care -3% nebs BID  Fever, intermittent  Leukocytosis  Potential for multiple sources > sinus disease on CT, neuro from CVA / edema, aspiration PNA / effusion on left, diarrhea, drug fever  -Antibiotics completed at 14 days on 6/5 P -Trend fever curve, WBC, improving today  -monitoring off abx  -cultures negative. -warrants recx if cont to spike temp and wbc rises   Hx of hypertension  Hx of HLD. P -Continue tele monitoring -PRN labetalol   CKD 2 -baseline cr about 1.2  -stable Hypernatremia, stable to slight improvement P -cont fwf  250q3h for today -trend UOP, renal indices: relatively stable  -minimize nephrotoxic agents -repeat bmp in am.   Hyperglycemia -trend, no indication for SSI at this time   Diarrhea  -trend output -holding motility agents  Goals of Care -will likely need LTAC placement -appreciate cm assistance -Limited code blue   Daily Goals Checklist  Pain/Anxiety/Delirium protocol (if indicated): PRN fent  VAP protocol (if indicated): yes DVT prophylaxis: lovenox  Nutrition Status: EN , FWF  GI prophylaxis: PPI  Glucose control: monitor Code Status: Limited Family Communication: 6/9 via phone Disposition: ICU   Labs   CBC: Recent Labs  Lab 10/22/19 0446 10/22/19 0446  10/23/19 0425 10/24/19 0414 10/24/19 0559 10/25/19 0615 10/26/19 0519  WBC 28.5*  --  20.6*  --  17.9* 18.1* 15.2*  NEUTROABS  --   --   --   --  14.2*  --   --   HGB 11.9*   < > 11.6* 10.2* 10.6* 10.4* 10.2*  HCT 36.6*   < > 35.5* 30.0* 33.0* 32.5* 31.5*  MCV 94.6  --  94.9  --  94.3 95.0 94.6  PLT 682*  --  591*  --  551* 520* 507*   < > = values in this interval not displayed.    Basic Metabolic Panel: Recent Labs  Lab 10/22/19 0446 10/22/19 0446 10/23/19 0425 10/24/19 0414 10/24/19 0559 10/25/19 0615 10/26/19 0519  NA 150*   < > 148* 150* 148* 151* 146*  K 4.8   < > 5.1 3.4* 3.7 4.1 3.6  CL 120*  --  118*  --  119* 120* 116*  CO2 19*   --  21*  --  19* 21* 21*  GLUCOSE 146*  --  124*  --  120* 131* 132*  BUN 40*  --  38*  --  30* 28* 22  CREATININE 1.42*  --  1.33*  --  1.17 1.11 1.05  CALCIUM 8.0*  --  7.9*  --  8.2* 8.1* 7.9*  MG  --   --   --   --   --   --  2.3   < > = values in this interval not displayed.   GFR: Estimated Creatinine Clearance: 65.6 mL/min (by C-G formula based on SCr of 1.05 mg/dL). Recent Labs  Lab 10/23/19 0425 10/24/19 0559 10/25/19 0615 10/26/19 0519  WBC 20.6* 17.9* 18.1* 15.2*    Liver Function Tests: Recent Labs  Lab 10/22/19 0446  AST 115*  ALT 236*  ALKPHOS 126  BILITOT 0.9  PROT 5.8*  ALBUMIN 1.7*   No results for input(s): LIPASE, AMYLASE in the last 168 hours. No results for input(s): AMMONIA in the last 168 hours.  ABG    Component Value Date/Time   PHART 7.469 (H) 10/24/2019 0414   PCO2ART 27.8 (L) 10/24/2019 0414   PO2ART 138 (H) 10/24/2019 0414   HCO3 20.0 10/24/2019 0414   TCO2 21 (L) 10/24/2019 0414   ACIDBASEDEF 3.0 (H) 10/24/2019 0414   O2SAT 99.0 10/24/2019 0414     Coagulation Profile: Recent Labs  Lab 10/20/19 1019  INR 1.4*    Cardiac Enzymes: No results for input(s): CKTOTAL, CKMB, CKMBINDEX, TROPONINI in the last 168 hours.  HbA1C: Hgb A1c MFr Bld  Date/Time Value Ref Range Status  10/03/2019 02:15 AM 5.9 (H) 4.8 - 5.6 % Final    Comment:    (NOTE)         Prediabetes: 5.7 - 6.4         Diabetes: >6.4         Glycemic control for adults with diabetes: <7.0     CBG: Recent Labs  Lab 10/25/19 1154 10/25/19 1542 10/25/19 1937 10/25/19 2334 10/26/19 0332  GLUCAP 142* 122* 130* 116* 117*    Critical care time: The patient is critically ill with multiple organ systems failure and requires high complexity decision making for assessment and support, frequent evaluation and titration of therapies, application of advanced monitoring technologies and extensive interpretation of multiple databases.  Critical care time 39 mins. This  represents my time independent of the NPs time taking care of the pt. This is  excluding procedures.    [facility] 10/26/2019, 7:48 AM

## 2019-10-26 NOTE — Progress Notes (Signed)
New temp to 102. Will pan cx, send pct and start empiric zosyn (just completed cefepime and vanc 6/5)

## 2019-10-26 NOTE — Progress Notes (Signed)
Report given to Samule Ohm, RN on [place] cleaned up and transported onto new bed. Belongings packed. Family notified of transfer. Kaydynce Pat, Rande Brunt, RN

## 2019-10-26 NOTE — Progress Notes (Addendum)
Occupational Therapy Treatment Patient Details Name: Riley Wagner MRN: [id number] DOB: 1955-11-22 Today's Date: 10/26/2019    History of present illness 64 y.o. male with a history of hypertension who was last known well prior to bed 5/15. He lost conciousness last night and fell, but refused transport with BP of 169 systolic and negative stroke screen by EMS. On 5/16 pt with R weakness and apahasia on awakening. Pt found to have large penumbra on CT and taken for emergent IR thrombectomy of L MCA followed by L ICA coil embolization 2/2 bleeding. Pt extubated on 5/21. PEG placed 5/28, re-intubated 6/2 and tracheostomy performed 6/3.   OT comments  Pt tolerated sitting EOB with two person assist today; OOB attempts deferred as pt with rectal pouch removed and pt having frequent BMs today requiring cleanup/perciare often. Pt remains very distractible and much difficulty following simple commands. Pt briefly able to maintain sitting balance with close minguard, though often requiring up to maxA for balance due to posterior lean. Additional focus on RUE wt bearing, attempting to visually track and focus on item at midline and incorporating RUE into holding item (flashlight). Noted pt likely for d/c to [facility] today. Will continue to follow while he remains acutely admitted.    Follow Up Recommendations  LTACH    Equipment Recommendations  Wheelchair (measurements OT);Wheelchair cushion (measurements OT);Hospital bed    Recommendations for Other Services      Precautions / Restrictions Precautions Precautions: Fall Precaution Comments: CV<893 systolically; R HP, trach, PEG       Mobility Bed Mobility Overal bed mobility: Needs Assistance Bed Mobility: Rolling;Sidelying to Sit;Sit to Supine Rolling: Max assist;+2 for physical assistance Sidelying to sit: Total assist;+2 for physical assistance   Sit to supine: Total assist;+2 for physical assistance   General bed mobility comments: rolling  for hygiene prior to EOB due to stooling constantly and no flexiseal, patient not initiating to use L UE to assist; side to sit with A for trunk and legs, to supine assist for trunk and legs  Transfers                 General transfer comment: NT, was going to try to chair, but RN reports likely to [facility] today and now without flexiseal    Balance Overall balance assessment: Needs assistance Sitting-balance support: Feet supported Sitting balance-Leahy Scale: Poor Sitting balance - Comments: sat EOB about 10 minutes working on sitting balance, R side awareness, midline focus for oject placed in L hand assist for R hand on flashlight. Postural control: Posterior lean                                 ADL either performed or assessed with clinical judgement   ADL Overall ADL's : Needs assistance/impaired                                       General ADL Comments: totalA, pt with frequent diahrrea today     Vision       Perception     Praxis      Cognition Arousal/Alertness: Awake/alert Behavior During Therapy: Flat affect Overall Cognitive Status: Impaired/Different from baseline Area of Impairment: Attention;Following commands;Safety/judgement                   Current Attention Level: Focused   Following  Commands: Follows one step commands inconsistently;Follows one step commands with increased time Safety/Judgement: Decreased awareness of safety;Decreased awareness of deficits   Problem Solving: Slow processing;Decreased initiation;Difficulty sequencing;Requires verbal cues;Requires tactile cues          Exercises     Shoulder Instructions       General Comments on vent via trach PRVC 40% FiO2 PEEP 5    Pertinent Vitals/ Pain       Pain Assessment: Faces Faces Pain Scale: No hurt Pain Intervention(s): Monitored during session  Home Living                                          Prior  Functioning/Environment              Frequency  Min 2X/week        Progress Toward Goals  OT Goals(current goals can now be found in the care plan section)  Progress towards OT goals: Progressing toward goals  Acute Rehab OT Goals Patient Stated Goal: none stated OT Goal Formulation: Patient unable to participate in goal setting Time For Goal Achievement: 11/05/19 Potential to Achieve Goals: [place] Discharge plan needs to be updated    Co-evaluation    PT/OT/SLP Co-Evaluation/Treatment: Yes Reason for Co-Treatment: Complexity of the patient's impairments (multi-system involvement);For patient/therapist safety;To address functional/ADL transfers PT goals addressed during session: Mobility/safety with mobility;Balance;Other (comment)(R side awareness) OT goals addressed during session: ADL's and self-care      AM-PAC OT "6 Clicks" Daily Activity     Outcome Measure   Help from another person eating meals?: Total Help from another person taking care of personal grooming?: Total Help from another person toileting, which includes using toliet, bedpan, or urinal?: Total Help from another person bathing (including washing, rinsing, drying)?: Total Help from another person to put on and taking off regular upper body clothing?: Total Help from another person to put on and taking off regular lower body clothing?: Total 6 Click Score: 6    End of Session Equipment Utilized During Treatment: Oxygen  OT Visit Diagnosis: Cognitive communication deficit (R41.841);Other abnormalities of gait and mobility (R26.89);Muscle weakness (generalized) (M62.81) Hemiplegia - Right/Left: Right Hemiplegia - caused by: Cerebral infarction   Activity Tolerance Patient tolerated treatment well   Patient Left in bed;with call bell/phone within reach   Nurse Communication Mobility status        Time: 3335-4562 OT Time Calculation (min): 45 min  Charges: OT General Charges $OT  Visit: 1 Visit OT Treatments $Self Care/Home Management : 23-37 mins  Lou Cal, OT Acute Rehabilitation Services Pager [phone] Office [phone]   Raymondo Band 10/26/2019, 5:51 PM

## 2019-10-26 NOTE — Progress Notes (Signed)
Nutrition Follow-up  DOCUMENTATION CODES:   Not applicable  INTERVENTION:   Tube feeding via PEG:  Osmolite 1.2 at 75 ml/h (1800 ml per day) MVI with minerals daily   Provides 2160 kcal, 112 gm protein, 1459 ml free water daily  250 ml free water every 3 hours Total free water 3459 ml   D/C Osmolite 1.5 and Prostat  NUTRITION DIAGNOSIS:   Inadequate oral intake related to inability to eat as evidenced by NPO status. Ongoing.   GOAL:   Patient will meet greater than or equal to 90% of their needs Meeting with TF  MONITOR:   TF tolerance, Labs  REASON FOR ASSESSMENT:   Consult, Ventilator Enteral/tube feeding initiation and management  ASSESSMENT:   Pt with PMH of HTN, HLD, and former smoker now admitted with L MCA infarct due to hypertensive crisis. Pt s/p emergent thrombectomy of L MCA M1 occlusion, balloon angioplasty of R ICA stenosis followed by coil embolization of R ICA secondary to persistent bleeding.   Pt discussed during ICU rounds and with RN.   5/21 extubated; cortrak tube placed  5/28 s/p PEG 6/2 respiratory distress; re-intubated 6/3 s/p trach 6/7 emesis 6/8 TF resumed   Patient is currently intubated on ventilator support MV: 14 L/min Temp (24hrs), Avg:100.1 F (37.8 C), Min:98.4 F (36.9 C), Max:102.2 F (39 C)   Medications reviewed and include: colace, MVI Labs reviewed: Na 146 (H)    TF: Osmolite 1.5 at 55 ml/h with Pro-stat 30 ml BID Provides 2180 kcal, 112 gm protein, 1005 ml free water daily  Diet Order:   Diet Order    None      EDUCATION NEEDS:   No education needs have been identified at this time  Skin:  Skin Assessment: Reviewed RN Assessment  Last BM:  400 ml via rectal pouch  Height:   Ht Readings from Last 1 Encounters:  10/02/19 5\' 7"  (1.702 m)    Weight:   Wt Readings from Last 1 Encounters:  10/26/19 77.5 kg    Ideal Body Weight:  67.2 kg  BMI:  Body mass index is 26.76 kg/m.  Estimated  Nutritional Needs:   Kcal:  2200  Protein:  100-120 grams  Fluid:  > 1.9 L/day  Lockie Pares., RD, LDN, CNSC See AMiON for contact information

## 2019-10-27 ENCOUNTER — Other Ambulatory Visit (HOSPITAL_COMMUNITY): Payer: Medicare Other

## 2019-10-27 ENCOUNTER — Encounter: Payer: Self-pay | Admitting: Internal Medicine

## 2019-10-27 DIAGNOSIS — I63512 Cerebral infarction due to unspecified occlusion or stenosis of left middle cerebral artery: Secondary | ICD-10-CM | POA: Diagnosis present

## 2019-10-27 DIAGNOSIS — J9621 Acute and chronic respiratory failure with hypoxia: Secondary | ICD-10-CM | POA: Diagnosis present

## 2019-10-27 DIAGNOSIS — N182 Chronic kidney disease, stage 2 (mild): Secondary | ICD-10-CM | POA: Diagnosis present

## 2019-10-27 DIAGNOSIS — J9811 Atelectasis: Secondary | ICD-10-CM | POA: Diagnosis present

## 2019-10-27 DIAGNOSIS — I69298 Other sequelae of other nontraumatic intracranial hemorrhage: Secondary | ICD-10-CM

## 2019-10-27 LAB — CBC WITH DIFFERENTIAL/PLATELET
Abs Immature Granulocytes: 0.16 10*3/uL — ABNORMAL HIGH (ref 0.00–0.07)
Basophils Absolute: 0.1 10*3/uL (ref 0.0–0.1)
Basophils Relative: 0 %
Eosinophils Absolute: 0.3 10*3/uL (ref 0.0–0.5)
Eosinophils Relative: 2 %
HCT: 29.4 % — ABNORMAL LOW (ref 39.0–52.0)
Hemoglobin: 9.6 g/dL — ABNORMAL LOW (ref 13.0–17.0)
Immature Granulocytes: 1 %
Lymphocytes Relative: 11 %
Lymphs Abs: 1.8 10*3/uL (ref 0.7–4.0)
MCH: 30.4 pg (ref 26.0–34.0)
MCHC: 32.7 g/dL (ref 30.0–36.0)
MCV: 93 fL (ref 80.0–100.0)
Monocytes Absolute: 1.2 10*3/uL — ABNORMAL HIGH (ref 0.1–1.0)
Monocytes Relative: 8 %
Neutro Abs: 12.7 10*3/uL — ABNORMAL HIGH (ref 1.7–7.7)
Neutrophils Relative %: 78 %
Platelets: 456 10*3/uL — ABNORMAL HIGH (ref 150–400)
RBC: 3.16 MIL/uL — ABNORMAL LOW (ref 4.22–5.81)
RDW: 15.6 % — ABNORMAL HIGH (ref 11.5–15.5)
WBC: 16.2 10*3/uL — ABNORMAL HIGH (ref 4.0–10.5)
nRBC: 0 % (ref 0.0–0.2)

## 2019-10-27 LAB — COMPREHENSIVE METABOLIC PANEL
ALT: 153 U/L — ABNORMAL HIGH (ref 0–44)
AST: 88 U/L — ABNORMAL HIGH (ref 15–41)
Albumin: 1.5 g/dL — ABNORMAL LOW (ref 3.5–5.0)
Alkaline Phosphatase: 100 U/L (ref 38–126)
Anion gap: 7 (ref 5–15)
BUN: 19 mg/dL (ref 8–23)
CO2: 26 mmol/L (ref 22–32)
Calcium: 8 mg/dL — ABNORMAL LOW (ref 8.9–10.3)
Chloride: 112 mmol/L — ABNORMAL HIGH (ref 98–111)
Creatinine, Ser: 1.07 mg/dL (ref 0.61–1.24)
GFR calc Af Amer: >60 mL/min (ref >60)
GFR calc non Af Amer: >60 mL/min (ref >60)
Glucose, Bld: 124 mg/dL — ABNORMAL HIGH (ref 70–99)
Potassium: 3.6 mmol/L (ref 3.5–5.1)
Sodium: 145 mmol/L (ref 135–145)
Total Bilirubin: 0.6 mg/dL (ref 0.3–1.2)
Total Protein: 5.1 g/dL — ABNORMAL LOW (ref 6.5–8.1)

## 2019-10-27 LAB — PROTIME-INR
INR: 1.3 — ABNORMAL HIGH (ref 0.8–1.2)
Prothrombin Time: 15.8 seconds — ABNORMAL HIGH (ref 11.4–15.2)

## 2019-10-27 NOTE — Consult Note (Signed)
Pulmonary [place]  Date of Service: 10/27/2019  PULMONARY CRITICAL CARE Gustav Knueppel  CHE:[id number]  DOB: [birth year]   DOA: 10/26/2019  Referring Physician: Merton Border, MD  HPI: Riley Wagner is a 64 y.o. male seen for follow up of Acute on Chronic Respiratory Failure.  Patient has multiple medical problems including hypertension coronary artery disease came into the hospital because of an acute stroke.  Patient was in hypertensive crisis had right-sided weakness.  Was found to have a ICA occlusion underwent thrombectomy with subsequent intraparenchymal bleed.  Hospital course was prolonged.  Consultation with neurology and trauma was made he was extubated on the 21st but desaturated and required reintubation.  Patient subsequently had a tracheostomy done for failure to come off the ventilator.  Also had other issues with infection was treated empirically with antibiotics.  Patient has been having on and off fevers has been completed cefepime.  Transferred to our facility for further management weaning.  Right now is on assist control in order FiO2 of 30% tidal volume 540  Review of Systems:  ROS performed and is unremarkable other than noted above.  Past Medical History:  Diagnosis Date  . Hypertension   . MI (myocardial infarction) [facility])     Past Surgical History:  Procedure Laterality Date  . BACK SURGERY     2004  . ESOPHAGOGASTRODUODENOSCOPY N/A 10/14/2019   Procedure: ESOPHAGOGASTRODUODENOSCOPY (EGD);  Surgeon: Jesusita Oka, MD;  Location: [place];  Service: General;  Laterality: N/A;  . IR ANGIOGRAM FOLLOW UP STUDY  10/02/2019  . IR CT HEAD LTD  10/02/2019  . IR CT HEAD LTD  10/02/2019  . IR PERCUTANEOUS ART THROMBECTOMY/INFUSION INTRACRANIAL INC DIAG ANGIO  10/02/2019  . IR PTA INTRACRANIAL  10/02/2019  . IR TRANSCATH/EMBOLIZ  10/02/2019  . IR US GUIDE VASC ACCESS RIGHT  10/02/2019  . PEG PLACEMENT  N/A 10/14/2019   Procedure: PERCUTANEOUS ENDOSCOPIC GASTROSTOMY (PEG) PLACEMENT;  Surgeon: Jesusita Oka, MD;  Location: [place];  Service: General;  Laterality: N/A;  . RADIOLOGY WITH ANESTHESIA N/A 10/02/2019   Procedure: IR WITH ANESTHESIA;  Surgeon: Radiologist, Medication, MD;  Location: [place];  Service: Radiology;  Laterality: N/A;    Social History:    reports that he quit smoking about 5 years ago. His smoking use included cigarettes. He has a 5.00 pack-year smoking history. He has never used smokeless tobacco. He reports current alcohol use. He reports that he does not use drugs.  Family History: Non-Contributory to the present illness  No Known Allergies  Medications: Reviewed on Rounds  Physical Exam:  Vitals: Temperature 99.2 pulse 82 respiratory rate 20 blood pressure is 155/84 saturations 99%  Ventilator Settings on assist control FiO2 30% tidal volume 540 PEEP 5  . General: Comfortable at this time . Eyes: Grossly normal lids, irises & conjunctiva . ENT: grossly tongue is normal . Neck: no obvious mass . Cardiovascular: S1-S2 normal no gallop or rub . Respiratory: No rhonchi no rales noted at this time . Abdomen: Soft and nontender . Skin: no rash seen on limited exam . Musculoskeletal: not rigid . Psychiatric:unable to assess . Neurologic: no seizure no involuntary movements         Labs on Admission:  Basic Metabolic Panel: Recent Labs  Lab 10/23/19 0425 10/23/19 0425 10/24/19 0414 10/24/19 0559 10/25/19 0615 10/26/19 0519 10/27/19 0613  NA 148*   < > 150* 148* 151* 146* 145  K  5.1   < > 3.4* 3.7 4.1 3.6 3.6  CL 118*  --   --  119* 120* 116* 112*  CO2 21*  --   --  19* 21* 21* 26  GLUCOSE 124*  --   --  120* 131* 132* 124*  BUN 38*  --   --  30* 28* 22 19  CREATININE 1.33*  --   --  1.17 1.11 1.05 1.07  CALCIUM 7.9*  --   --  8.2* 8.1* 7.9* 8.0*  MG  --   --   --   --   --  2.3  --    < > = values in this interval not displayed.     Recent Labs  Lab 10/22/19 0353 10/24/19 0414  PHART 7.473* 7.469*  PCO2ART 25.4* 27.8*  PO2ART 81* 138*  HCO3 18.7* 20.0  O2SAT 97.0 99.0    Liver Function Tests: Recent Labs  Lab 10/22/19 0446 10/27/19 0613  AST 115* 88*  ALT 236* 153*  ALKPHOS 126 100  BILITOT 0.9 0.6  PROT 5.8* 5.1*  ALBUMIN 1.7* 1.5*   No results for input(s): LIPASE, AMYLASE in the last 168 hours. No results for input(s): AMMONIA in the last 168 hours.  CBC: Recent Labs  Lab 10/23/19 0425 10/23/19 0425 10/24/19 0414 10/24/19 0559 10/25/19 0615 10/26/19 0519 10/27/19 0613  WBC 20.6*  --   --  17.9* 18.1* 15.2* 16.2*  NEUTROABS  --   --   --  14.2*  --   --  12.7*  HGB 11.6*   < > 10.2* 10.6* 10.4* 10.2* 9.6*  HCT 35.5*   < > 30.0* 33.0* 32.5* 31.5* 29.4*  MCV 94.9  --   --  94.3 95.0 94.6 93.0  PLT 591*  --   --  551* 520* 507* 456*   < > = values in this interval not displayed.    Cardiac Enzymes: No results for input(s): CKTOTAL, CKMB, CKMBINDEX, TROPONINI in the last 168 hours.  BNP (last 3 results) No results for input(s): BNP in the last 8760 hours.  ProBNP (last 3 results) No results for input(s): PROBNP in the last 8760 hours.   Radiological Exams on Admission: DG Abd 1 View  Result Date: 10/26/2019 CLINICAL DATA:  Peg placement. EXAM: ABDOMEN - 1 VIEW COMPARISON:  10/15/2019 FINDINGS: Portable AP supine view after the installation of 30 cc Isovue 300 through indwelling gastrostomy tube. Contrast opacifies the distal stomach and duodenum. There is no evidence of extravasation or leak. Air-filled prominent small bowel in the central abdomen. No significant formed stool in the colon. IMPRESSION: 1. Gastrostomy tube in the stomach without evidence of extravasation or leak. 2. Air-filled prominent small bowel in the central abdomen is likely ileus. Electronically Signed   By: Keith Rake M.D.   On: 10/26/2019 19:25   DG Chest Port 1 View  Result Date: 10/27/2019 CLINICAL  DATA:  Respiratory failure EXAM: PORTABLE CHEST 1 VIEW COMPARISON:  Two days ago FINDINGS: Tracheostomy tube in place. Improved aeration of the left lung. Lung volumes remain low. Normal for technique heart size. Artifact from EKG leads IMPRESSION: Low lung volumes but improved aeration from prior. Electronically Signed   By: Monte Fantasia M.D.   On: 10/27/2019 06:50   DG CHEST PORT 1 VIEW  Result Date: 10/25/2019 CLINICAL DATA:  Respiratory failure EXAM: PORTABLE CHEST 1 VIEW COMPARISON:  October 24, 2019 FINDINGS: Tracheostomy catheter tip is 4.9 cm above the carina. No pneumothorax. There  is focal airspace opacity in the left lower lobe as well as linear atelectasis in the lateral left base. Lungs otherwise clear. Heart size and pulmonary vascularity are normal. No adenopathy. No bone lesions. IMPRESSION: Tracheostomy as described. No pneumothorax. Focal airspace opacity in the left base, likely developing pneumonia. There is also left base atelectasis. Lungs otherwise clear. Stable cardiac silhouette. Electronically Signed   By: Lowella Grip III M.D.   On: 10/25/2019 08:01   DG Chest Port 1 View  Result Date: 10/24/2019 CLINICAL DATA:  Respiratory failure, tracheostomy EXAM: PORTABLE CHEST 1 VIEW COMPARISON:  Radiograph 10/22/2019 FINDINGS: Tracheostomy tube remains in the mid trachea. Telemetry leads and additional support devices overlie the chest. Low lung volumes slightly diminished from comparison exam with some streaky basilar atelectatic changes. No new focal consolidative opacity. No visible pneumothorax or effusion. Stable cardiomediastinal contours. No acute osseous or soft tissue abnormality. IMPRESSION: Low lung volumes with some streaky basilar atelectatic changes. No new focal consolidative opacity. Support devices as above. Electronically Signed   By: Lovena Le M.D.   On: 10/24/2019 06:07    Assessment/Plan Active Problems:   Acute on chronic respiratory failure with hypoxia  (HCC)   Other sequelae of other nontraumatic intracranial hemorrhage   Arterial ischemic stroke, MCA (middle cerebral artery), left, acute (HCC)   Chronic kidney disease, stage II (mild)   Atelectasis pulmonary   1. Acute on chronic respiratory failure hypoxia patient's mechanics will be assessed and wean readiness will be assessed.  Right now is on full support on assist control mode on 30% FiO2 with good volumes.  We will check an RSB I mechanics 2. Left MCA stroke supportive care therapy as tolerated. 3. Aspiration pneumonia patient has had bouts of aspiration treated with antibiotics last course was with cefepime apparently 4. Chronic kidney disease stage II renal disease has been relatively stable continue to monitor labs 5. Atelectasis needs aggressive pulmonary toilet and supportive care  I have personally seen and evaluated the patient, evaluated laboratory and imaging results, formulated the assessment and plan and placed orders. The Patient requires high complexity decision making with multiple systems involvement.  Case was discussed on Rounds with the Respiratory Therapy Director and the Respiratory staff Time Spent 45minutes  Bland Rudzinski A Psalms Olarte, MD [facility] Pulmonary Critical Care Medicine Sleep Medicine

## 2019-10-28 ENCOUNTER — Other Ambulatory Visit (HOSPITAL_COMMUNITY): Payer: Medicare Other

## 2019-10-28 LAB — CULTURE, RESPIRATORY W GRAM STAIN: Culture: NORMAL

## 2019-10-28 NOTE — Progress Notes (Addendum)
Pulmonary Critical Care Medicine [employer]   PULMONARY CRITICAL CARE SERVICE  PROGRESS NOTE  Date of Service: 10/28/2019  Riley Wagner  [id number]  DOB: [birth year]   DOA: 10/26/2019  Referring Physician: Merton Border, MD  HPI: Riley Wagner is a 64 y.o. male seen for follow up of Acute on Chronic Respiratory Failure. Patient continues on pressure support 12/5 and FiO2 35% for goal of 6 hours today currently satting well with no distress.  Medications: Reviewed on Rounds  Physical Exam:  Vitals: Pulse 76 respiration 17 BP 152/90 O2 sat 99% temp 98.4  Ventilator Settings pressure support 12/5 FiO2 of 35%  . General: Comfortable at this time . Eyes: Grossly normal lids, irises & conjunctiva . ENT: grossly tongue is normal . Neck: no obvious mass . Cardiovascular: S1 S2 normal no gallop . Respiratory: No rales or rhonchi noted . Abdomen: soft . Skin: no rash seen on limited exam . Musculoskeletal: not rigid . Psychiatric:unable to assess . Neurologic: no seizure no involuntary movements         Lab Data:   Basic Metabolic Panel: Recent Labs  Lab 10/23/19 0425 10/23/19 0425 10/24/19 0414 10/24/19 0559 10/25/19 0615 10/26/19 0519 10/27/19 0613  NA 148*   < > 150* 148* 151* 146* 145  K 5.1   < > 3.4* 3.7 4.1 3.6 3.6  CL 118*  --   --  119* 120* 116* 112*  CO2 21*  --   --  19* 21* 21* 26  GLUCOSE 124*  --   --  120* 131* 132* 124*  BUN 38*  --   --  30* 28* 22 19  CREATININE 1.33*  --   --  1.17 1.11 1.05 1.07  CALCIUM 7.9*  --   --  8.2* 8.1* 7.9* 8.0*  MG  --   --   --   --   --  2.3  --    < > = values in this interval not displayed.    ABG: Recent Labs  Lab 10/22/19 0353 10/24/19 0414  PHART 7.473* 7.469*  PCO2ART 25.4* 27.8*  PO2ART 81* 138*  HCO3 18.7* 20.0  O2SAT 97.0 99.0    Liver Function Tests: Recent Labs  Lab 10/22/19 0446 10/27/19 0613  AST 115* 88*  ALT 236* 153*  ALKPHOS 126 100  BILITOT 0.9 0.6  PROT 5.8*  5.1*  ALBUMIN 1.7* 1.5*   No results for input(s): LIPASE, AMYLASE in the last 168 hours. No results for input(s): AMMONIA in the last 168 hours.  CBC: Recent Labs  Lab 10/23/19 0425 10/23/19 0425 10/24/19 0414 10/24/19 0559 10/25/19 0615 10/26/19 0519 10/27/19 0613  WBC 20.6*  --   --  17.9* 18.1* 15.2* 16.2*  NEUTROABS  --   --   --  14.2*  --   --  12.7*  HGB 11.6*   < > 10.2* 10.6* 10.4* 10.2* 9.6*  HCT 35.5*   < > 30.0* 33.0* 32.5* 31.5* 29.4*  MCV 94.9  --   --  94.3 95.0 94.6 93.0  PLT 591*  --   --  551* 520* 507* 456*   < > = values in this interval not displayed.    Cardiac Enzymes: No results for input(s): CKTOTAL, CKMB, CKMBINDEX, TROPONINI in the last 168 hours.  BNP (last 3 results) No results for input(s): BNP in the last 8760 hours.  ProBNP (last 3 results) No results for input(s): PROBNP in the last 8760 hours.  Radiological  Exams: DG Abd 1 View  Result Date: 10/28/2019 CLINICAL DATA:  Ileus EXAM: ABDOMEN - 1 VIEW COMPARISON:  Two days ago FINDINGS: Percutaneous gastrostomy tube. Mild gaseous distension of small and large bowel without overt obstructive pattern. Formed stool seen in the descending colon. Previously administered enteric contrast is no longer seen. Lung bases are clear IMPRESSION: Mild gaseous distension of large and small bowel in the setting of reported ileus. Electronically Signed   By: Monte Fantasia M.D.   On: 10/28/2019 07:14   DG Chest Port 1 View  Result Date: 10/27/2019 CLINICAL DATA:  Respiratory failure EXAM: PORTABLE CHEST 1 VIEW COMPARISON:  Two days ago FINDINGS: Tracheostomy tube in place. Improved aeration of the left lung. Lung volumes remain low. Normal for technique heart size. Artifact from EKG leads IMPRESSION: Low lung volumes but improved aeration from prior. Electronically Signed   By: Monte Fantasia M.D.   On: 10/27/2019 06:50    Assessment/Plan Active Problems:   Acute on chronic respiratory failure with hypoxia  (HCC)   Other sequelae of other nontraumatic intracranial hemorrhage   Arterial ischemic stroke, MCA (middle cerebral artery), left, acute (HCC)   Chronic kidney disease, stage II (mild)   Atelectasis pulmonary   1. Acute on chronic respiratory failure hypoxia patient has a 6-hour goal today on pressure support currently on 35% FiO2 satting well continue present pulmonary toilet supportive measures. 2. Left MCA stroke supportive care therapy as tolerated. 3. Aspiration pneumonia patient has had bouts of aspiration treated with antibiotics last course was with cefepime apparently 4. Chronic kidney disease stage II renal disease has been relatively stable continue to monitor labs 5. Atelectasis needs aggressive pulmonary toilet and supportive care   I have personally seen and evaluated the patient, evaluated laboratory and imaging results, formulated the assessment and plan and placed orders. The Patient requires high complexity decision making with multiple systems involvement.  Rounds were done with the Respiratory Therapy Director and Staff therapists and discussed with nursing staff also.  Allyne Gee, MD [facility] Pulmonary Critical Care Medicine Sleep Medicine

## 2019-10-29 NOTE — Progress Notes (Signed)
Pulmonary Critical Care Medicine [place]   PULMONARY CRITICAL CARE SERVICE  PROGRESS NOTE  Date of Service: 10/29/2019  Riley Wagner  JOA:[id number]  DOB: July 16, 1955   DOA: 10/26/2019  Referring Physician: Merton Border, MD  HPI: Riley Wagner is a 64 y.o. male seen for follow up of Acute on Chronic Respiratory Failure.  At this time patient is weaning on pressure support for the goal of 12 hours so far looks good  Medications: Reviewed on Rounds  Physical Exam:  Vitals: Temperature is 98.0 pulse 74 respiratory 17 blood pressure is 170/98 saturations 100%  Ventilator Settings on pressure support FiO2 30% pressure poor 12/5  . General: Comfortable at this time . Eyes: Grossly normal lids, irises & conjunctiva . ENT: grossly tongue is normal . Neck: no obvious mass . Cardiovascular: S1 S2 normal no gallop . Respiratory: No rhonchi no rales are noted at this time . Abdomen: soft . Skin: no rash seen on limited exam . Musculoskeletal: not rigid . Psychiatric:unable to assess . Neurologic: no seizure no involuntary movements         Lab Data:   Basic Metabolic Panel: Recent Labs  Lab 10/23/19 0425 10/23/19 0425 10/24/19 0414 10/24/19 0559 10/25/19 0615 10/26/19 0519 10/27/19 0613  NA 148*   < > 150* 148* 151* 146* 145  K 5.1   < > 3.4* 3.7 4.1 3.6 3.6  CL 118*  --   --  119* 120* 116* 112*  CO2 21*  --   --  19* 21* 21* 26  GLUCOSE 124*  --   --  120* 131* 132* 124*  BUN 38*  --   --  30* 28* 22 19  CREATININE 1.33*  --   --  1.17 1.11 1.05 1.07  CALCIUM 7.9*  --   --  8.2* 8.1* 7.9* 8.0*  MG  --   --   --   --   --  2.3  --    < > = values in this interval not displayed.    ABG: Recent Labs  Lab 10/24/19 0414  PHART 7.469*  PCO2ART 27.8*  PO2ART 138*  HCO3 20.0  O2SAT 99.0    Liver Function Tests: Recent Labs  Lab 10/27/19 0613  AST 88*  ALT 153*  ALKPHOS 100  BILITOT 0.6  PROT 5.1*  ALBUMIN 1.5*   No results for  input(s): LIPASE, AMYLASE in the last 168 hours. No results for input(s): AMMONIA in the last 168 hours.  CBC: Recent Labs  Lab 10/23/19 0425 10/23/19 0425 10/24/19 0414 10/24/19 0559 10/25/19 0615 10/26/19 0519 10/27/19 0613  WBC 20.6*  --   --  17.9* 18.1* 15.2* 16.2*  NEUTROABS  --   --   --  14.2*  --   --  12.7*  HGB 11.6*   < > 10.2* 10.6* 10.4* 10.2* 9.6*  HCT 35.5*   < > 30.0* 33.0* 32.5* 31.5* 29.4*  MCV 94.9  --   --  94.3 95.0 94.6 93.0  PLT 591*  --   --  551* 520* 507* 456*   < > = values in this interval not displayed.    Cardiac Enzymes: No results for input(s): CKTOTAL, CKMB, CKMBINDEX, TROPONINI in the last 168 hours.  BNP (last 3 results) No results for input(s): BNP in the last 8760 hours.  ProBNP (last 3 results) No results for input(s): PROBNP in the last 8760 hours.  Radiological Exams: DG Abd 1 View  Result Date:  10/28/2019 CLINICAL DATA:  Ileus EXAM: ABDOMEN - 1 VIEW COMPARISON:  Two days ago FINDINGS: Percutaneous gastrostomy tube. Mild gaseous distension of small and large bowel without overt obstructive pattern. Formed stool seen in the descending colon. Previously administered enteric contrast is no longer seen. Lung bases are clear IMPRESSION: Mild gaseous distension of large and small bowel in the setting of reported ileus. Electronically Signed   By: Monte Fantasia M.D.   On: 10/28/2019 07:14    Assessment/Plan Active Problems:   Acute on chronic respiratory failure with hypoxia (HCC)   Other sequelae of other nontraumatic intracranial hemorrhage   Arterial ischemic stroke, MCA (middle cerebral artery), left, acute (HCC)   Chronic kidney disease, stage II (mild)   Atelectasis pulmonary   1. Acute on chronic respiratory failure hypoxia continue to wean on pressure support as ordered titrate oxygen continue pulmonary toilet. 2. Nontraumatic intracranial hemorrhage no change we will continue with supportive care 3. Acute mid left sided  stroke supportive care continue present management 4. Chronic kidney disease stage II following labs 5. Atelectasis of the lungs aggressive pulmonary toilet needs to be continued bases appear improved   I have personally seen and evaluated the patient, evaluated laboratory and imaging results, formulated the assessment and plan and placed orders. The Patient requires high complexity decision making with multiple systems involvement.  Rounds were done with the Respiratory Therapy Director and Staff therapists and discussed with nursing staff also.  Allyne Gee, MD [facility] Pulmonary Critical Care Medicine Sleep Medicine

## 2019-10-30 ENCOUNTER — Other Ambulatory Visit (HOSPITAL_COMMUNITY): Payer: Medicare Other

## 2019-10-30 NOTE — Progress Notes (Signed)
Pulmonary Critical Care Medicine [place]   PULMONARY CRITICAL CARE SERVICE  PROGRESS NOTE  Date of Service: 10/30/2019  Riley Wagner  UDJ:[id number]  DOB: 1955/06/23   DOA: 10/26/2019  Referring Physician: Merton Border, MD  HPI: Riley Wagner is a 64 y.o. male seen for follow up of Acute on Chronic Respiratory Failure.  Patient at this time is on pressure support has been on 30% FiO2 the goal today is for 16 hours  Medications: Reviewed on Rounds  Physical Exam:  Vitals: Temperature is 98.5 pulse 77 respiratory 24 blood pressure is 100/56 saturations 100%  Ventilator Settings on pressure support FiO2 30%  . General: Comfortable at this time . Eyes: Grossly normal lids, irises & conjunctiva . ENT: grossly tongue is normal . Neck: no obvious mass . Cardiovascular: S1 S2 normal no gallop . Respiratory: No rhonchi no rales are noted at this time . Abdomen: soft . Skin: no rash seen on limited exam . Musculoskeletal: not rigid . Psychiatric:unable to assess . Neurologic: no seizure no involuntary movements         Lab Data:   Basic Metabolic Panel: Recent Labs  Lab 10/24/19 0414 10/24/19 0559 10/25/19 0615 10/26/19 0519 10/27/19 0613  NA 150* 148* 151* 146* 145  K 3.4* 3.7 4.1 3.6 3.6  CL  --  119* 120* 116* 112*  CO2  --  19* 21* 21* 26  GLUCOSE  --  120* 131* 132* 124*  BUN  --  30* 28* 22 19  CREATININE  --  1.17 1.11 1.05 1.07  CALCIUM  --  8.2* 8.1* 7.9* 8.0*  MG  --   --   --  2.3  --     ABG: Recent Labs  Lab 10/24/19 0414  PHART 7.469*  PCO2ART 27.8*  PO2ART 138*  HCO3 20.0  O2SAT 99.0    Liver Function Tests: Recent Labs  Lab 10/27/19 0613  AST 88*  ALT 153*  ALKPHOS 100  BILITOT 0.6  PROT 5.1*  ALBUMIN 1.5*   No results for input(s): LIPASE, AMYLASE in the last 168 hours. No results for input(s): AMMONIA in the last 168 hours.  CBC: Recent Labs  Lab 10/24/19 0414 10/24/19 0559 10/25/19 0615 10/26/19 0519  10/27/19 0613  WBC  --  17.9* 18.1* 15.2* 16.2*  NEUTROABS  --  14.2*  --   --  12.7*  HGB 10.2* 10.6* 10.4* 10.2* 9.6*  HCT 30.0* 33.0* 32.5* 31.5* 29.4*  MCV  --  94.3 95.0 94.6 93.0  PLT  --  551* 520* 507* 456*    Cardiac Enzymes: No results for input(s): CKTOTAL, CKMB, CKMBINDEX, TROPONINI in the last 168 hours.  BNP (last 3 results) No results for input(s): BNP in the last 8760 hours.  ProBNP (last 3 results) No results for input(s): PROBNP in the last 8760 hours.  Radiological Exams: No results found.  Assessment/Plan Active Problems:   Acute on chronic respiratory failure with hypoxia (HCC)   Other sequelae of other nontraumatic intracranial hemorrhage   Arterial ischemic stroke, MCA (middle cerebral artery), left, acute (HCC)   Chronic kidney disease, stage II (mild)   Atelectasis pulmonary   1. Acute on chronic respiratory failure hypoxia patient is on pressure support system on 30% FiO2 toward goal of 16 hours 2. Nontraumatic intracranial hemorrhage at baseline we will continue supportive care 3. Acute stroke supportive care therapy as tolerated 4. Chronic kidney disease stage II supportive care 5. Atelectasis continue with aggressive pulmonary toilet  I have personally seen and evaluated the patient, evaluated laboratory and imaging results, formulated the assessment and plan and placed orders. The Patient requires high complexity decision making with multiple systems involvement.  Rounds were done with the Respiratory Therapy Director and Staff therapists and discussed with nursing staff also.  Allyne Gee, MD [facility] Pulmonary Critical Care Medicine Sleep Medicine

## 2019-10-31 ENCOUNTER — Other Ambulatory Visit (HOSPITAL_COMMUNITY): Payer: Medicare Other

## 2019-10-31 LAB — CULTURE, BLOOD (ROUTINE X 2)
Culture: NO GROWTH
Culture: NO GROWTH
Special Requests: ADEQUATE

## 2019-10-31 NOTE — Progress Notes (Signed)
Pulmonary Critical Care Medicine [place]   PULMONARY CRITICAL CARE SERVICE  PROGRESS NOTE  Date of Service: 10/31/2019  Riley Wagner  [id number]  DOB: Sep 02, 1955   DOA: 10/26/2019  Referring Physician: Merton Border, MD  HPI: Riley Wagner is a 64 y.o. male seen for follow up of Acute on Chronic Respiratory Failure.  Patient is on T collar right now has been requiring 28% FiO2 with good saturations.  Secretions are fair to moderate  Medications: Reviewed on Rounds  Physical Exam:  Vitals: Temperature is 98.0 pulse 73 respiratory rate 21 blood pressure is 121/63 saturations 100%  Ventilator Settings off the ventilator on T collar  . General: Comfortable at this time . Eyes: Grossly normal lids, irises & conjunctiva . ENT: grossly tongue is normal . Neck: no obvious mass . Cardiovascular: S1 S2 normal no gallop . Respiratory: No rhonchi no rales . Abdomen: soft . Skin: no rash seen on limited exam . Musculoskeletal: not rigid . Psychiatric:unable to assess . Neurologic: no seizure no involuntary movements         Lab Data:   Basic Metabolic Panel: Recent Labs  Lab 10/25/19 0615 10/26/19 0519 10/27/19 0613  NA 151* 146* 145  K 4.1 3.6 3.6  CL 120* 116* 112*  CO2 21* 21* 26  GLUCOSE 131* 132* 124*  BUN 28* 22 19  CREATININE 1.11 1.05 1.07  CALCIUM 8.1* 7.9* 8.0*  MG  --  2.3  --     ABG: No results for input(s): PHART, PCO2ART, PO2ART, HCO3, O2SAT in the last 168 hours.  Liver Function Tests: Recent Labs  Lab 10/27/19 0613  AST 88*  ALT 153*  ALKPHOS 100  BILITOT 0.6  PROT 5.1*  ALBUMIN 1.5*   No results for input(s): LIPASE, AMYLASE in the last 168 hours. No results for input(s): AMMONIA in the last 168 hours.  CBC: Recent Labs  Lab 10/25/19 0615 10/26/19 0519 10/27/19 0613  WBC 18.1* 15.2* 16.2*  NEUTROABS  --   --  12.7*  HGB 10.4* 10.2* 9.6*  HCT 32.5* 31.5* 29.4*  MCV 95.0 94.6 93.0  PLT 520* 507* 456*     Cardiac Enzymes: No results for input(s): CKTOTAL, CKMB, CKMBINDEX, TROPONINI in the last 168 hours.  BNP (last 3 results) No results for input(s): BNP in the last 8760 hours.  ProBNP (last 3 results) No results for input(s): PROBNP in the last 8760 hours.  Radiological Exams: DG Abd 1 View  Result Date: 10/30/2019 CLINICAL DATA:  Suspected ileus EXAM: ABDOMEN - 1 VIEW COMPARISON:  October 28, 2019 FINDINGS: Gastrostomy catheter in region of stomach. There is slightly less bowel dilatation compared to 2 days prior. No air-fluid levels. No free air. Moderate stool noted in the colon. IMPRESSION: Gastrostomy catheter in stomach region. Less bowel dilatation in 2 days prior, likely indicative of resolving ileus. No free air. Electronically Signed   By: Lowella Grip III M.D.   On: 10/30/2019 13:29   DG Abd Portable 1V  Result Date: 10/31/2019 CLINICAL DATA:  Ileus EXAM: PORTABLE ABDOMEN - 1 VIEW COMPARISON:  Yesterday FINDINGS: Unchanged gaseous distension of bowel diffusely with no small bowel loops reaching pathologic measurement. Percutaneous gastrostomy tube in place. No concerning mass effect or gas collection IMPRESSION: Continued mild gaseous distension of bowel which may be patient's baseline. Electronically Signed   By: Monte Fantasia M.D.   On: 10/31/2019 06:46    Assessment/Plan Active Problems:   Acute on chronic respiratory failure with hypoxia (  [place])   Other sequelae of other nontraumatic intracranial hemorrhage   Arterial ischemic stroke, MCA (middle cerebral artery), left, acute (HCC)   Chronic kidney disease, stage II (mild)   Atelectasis pulmonary   1. Acute on chronic respiratory failure hypoxia we will continue with T collar trials titrate oxygen continue secretion management supportive care. 2. Nontraumatic intracranial hemorrhage no change 3. Acute stroke therapy as tolerated 4. Chronic kidney disease stage II following labs 5. Pulmonary atelectasis continue  with pulmonary toilet supportive care   I have personally seen and evaluated the patient, evaluated laboratory and imaging results, formulated the assessment and plan and placed orders. The Patient requires high complexity decision making with multiple systems involvement.  Rounds were done with the Respiratory Therapy Director and Staff therapists and discussed with nursing staff also.  Allyne Gee, MD [facility] Pulmonary Critical Care Medicine Sleep Medicine

## 2019-11-01 LAB — CBC
HCT: 29.2 % — ABNORMAL LOW (ref 39.0–52.0)
Hemoglobin: 9.7 g/dL — ABNORMAL LOW (ref 13.0–17.0)
MCH: 30.2 pg (ref 26.0–34.0)
MCHC: 33.2 g/dL (ref 30.0–36.0)
MCV: 91 fL (ref 80.0–100.0)
Platelets: 540 10*3/uL — ABNORMAL HIGH (ref 150–400)
RBC: 3.21 MIL/uL — ABNORMAL LOW (ref 4.22–5.81)
RDW: 14.8 % (ref 11.5–15.5)
WBC: 9.4 10*3/uL (ref 4.0–10.5)
nRBC: 0.2 % (ref 0.0–0.2)

## 2019-11-01 LAB — BASIC METABOLIC PANEL
Anion gap: 7 (ref 5–15)
BUN: 6 mg/dL — ABNORMAL LOW (ref 8–23)
CO2: 24 mmol/L (ref 22–32)
Calcium: 8 mg/dL — ABNORMAL LOW (ref 8.9–10.3)
Chloride: 110 mmol/L (ref 98–111)
Creatinine, Ser: 1.03 mg/dL (ref 0.61–1.24)
GFR calc Af Amer: >60 mL/min (ref >60)
GFR calc non Af Amer: >60 mL/min (ref >60)
Glucose, Bld: 113 mg/dL — ABNORMAL HIGH (ref 70–99)
Potassium: 2.8 mmol/L — ABNORMAL LOW (ref 3.5–5.1)
Sodium: 141 mmol/L (ref 135–145)

## 2019-11-01 LAB — MAGNESIUM: Magnesium: 2 mg/dL (ref 1.7–2.4)

## 2019-11-01 NOTE — Progress Notes (Signed)
Pulmonary Critical Care Medicine [place]   PULMONARY CRITICAL CARE SERVICE  PROGRESS NOTE  Date of Service: 11/01/2019  Kelten Enochs  CZY:[id number]  DOB: 1955/05/30   DOA: 10/26/2019  Referring Physician: Merton Border, MD  HPI: Rishard Delange is a 64 y.o. male seen for follow up of Acute on Chronic Respiratory Failure.  Patient currently is on T collar has been on 20% FiO2 the goal is for the 12 hours on the T collar.  Medications: Reviewed on Rounds  Physical Exam:  Vitals: Temperature is 96.8 pulse 80 respiratory rate 24 blood pressure is 116/71 saturations 100%  Ventilator Settings on T collar FiO2 is 28%  . General: Comfortable at this time . Eyes: Grossly normal lids, irises & conjunctiva . ENT: grossly tongue is normal . Neck: no obvious mass . Cardiovascular: S1 S2 normal no gallop . Respiratory: No rhonchi no rales are noted at this time . Abdomen: soft . Skin: no rash seen on limited exam . Musculoskeletal: not rigid . Psychiatric:unable to assess . Neurologic: no seizure no involuntary movements         Lab Data:   Basic Metabolic Panel: Recent Labs  Lab 10/26/19 0519 10/27/19 0613 11/01/19 0730  NA 146* 145 141  K 3.6 3.6 2.8*  CL 116* 112* 110  CO2 21* 26 24  GLUCOSE 132* 124* 113*  BUN 22 19 6*  CREATININE 1.05 1.07 1.03  CALCIUM 7.9* 8.0* 8.0*  MG 2.3  --  2.0    ABG: No results for input(s): PHART, PCO2ART, PO2ART, HCO3, O2SAT in the last 168 hours.  Liver Function Tests: Recent Labs  Lab 10/27/19 0613  AST 88*  ALT 153*  ALKPHOS 100  BILITOT 0.6  PROT 5.1*  ALBUMIN 1.5*   No results for input(s): LIPASE, AMYLASE in the last 168 hours. No results for input(s): AMMONIA in the last 168 hours.  CBC: Recent Labs  Lab 10/26/19 0519 10/27/19 0613 11/01/19 0730  WBC 15.2* 16.2* 9.4  NEUTROABS  --  12.7*  --   HGB 10.2* 9.6* 9.7*  HCT 31.5* 29.4* 29.2*  MCV 94.6 93.0 91.0  PLT 507* 456* 540*    Cardiac  Enzymes: No results for input(s): CKTOTAL, CKMB, CKMBINDEX, TROPONINI in the last 168 hours.  BNP (last 3 results) No results for input(s): BNP in the last 8760 hours.  ProBNP (last 3 results) No results for input(s): PROBNP in the last 8760 hours.  Radiological Exams: DG Abd Portable 1V  Result Date: 10/31/2019 CLINICAL DATA:  Ileus EXAM: PORTABLE ABDOMEN - 1 VIEW COMPARISON:  Yesterday FINDINGS: Unchanged gaseous distension of bowel diffusely with no small bowel loops reaching pathologic measurement. Percutaneous gastrostomy tube in place. No concerning mass effect or gas collection IMPRESSION: Continued mild gaseous distension of bowel which may be patient's baseline. Electronically Signed   By: Monte Fantasia M.D.   On: 10/31/2019 06:46    Assessment/Plan Active Problems:   Acute on chronic respiratory failure with hypoxia (HCC)   Other sequelae of other nontraumatic intracranial hemorrhage   Arterial ischemic stroke, MCA (middle cerebral artery), left, acute (HCC)   Chronic kidney disease, stage II (mild)   Atelectasis pulmonary   1. Acute on chronic respiratory failure hypoxia plan is to continue with T collar titrate oxygen continue pulmonary toilet. 2. Nontraumatic intracranial hemorrhage 3. We will continue to follow along. 4. Chronic kidney disease stage II following labs 5. Pulmonary atelectasis continue with aggressive pulmonary toilet   I have personally  seen and evaluated the patient, evaluated laboratory and imaging results, formulated the assessment and plan and placed orders. The Patient requires high complexity decision making with multiple systems involvement.  Rounds were done with the Respiratory Therapy Director and Staff therapists and discussed with nursing staff also.  Allyne Gee, MD [facility] Pulmonary Critical Care Medicine Sleep Medicine

## 2019-11-02 ENCOUNTER — Other Ambulatory Visit (HOSPITAL_COMMUNITY): Payer: Medicare Other

## 2019-11-02 LAB — POTASSIUM: Potassium: 3.4 mmol/L — ABNORMAL LOW (ref 3.5–5.1)

## 2019-11-02 NOTE — Progress Notes (Addendum)
Pulmonary Critical Care Medicine [place]   PULMONARY CRITICAL CARE SERVICE  PROGRESS NOTE  Date of Service: 11/02/2019  Riley Wagner  [id number]  DOB: [birth year]   DOA: 10/26/2019  Referring Physician: Merton Border, MD  HPI: Riley Wagner is a 64 y.o. male seen for follow up of Acute on Chronic Respiratory Failure. Patient is a 16-hour goal today on aerosol trach collar 28% FiO2 currently satting well no distress.  Medications: Reviewed on Rounds  Physical Exam:  Vitals: Pulse 80 respirations 18 BP 141/78 O2 sat 100% temp 97.5  Ventilator Settings ATC 28%  . General: Comfortable at this time . Eyes: Grossly normal lids, irises & conjunctiva . ENT: grossly tongue is normal . Neck: no obvious mass . Cardiovascular: S1 S2 normal no gallop . Respiratory: No rales or rhonchi noted . Abdomen: soft . Skin: no rash seen on limited exam . Musculoskeletal: not rigid . Psychiatric:unable to assess . Neurologic: no seizure no involuntary movements         Lab Data:   Basic Metabolic Panel: Recent Labs  Lab 10/27/19 0613 11/01/19 0730 11/02/19 0706  NA 145 141  --   K 3.6 2.8* 3.4*  CL 112* 110  --   CO2 26 24  --   GLUCOSE 124* 113*  --   BUN 19 6*  --   CREATININE 1.07 1.03  --   CALCIUM 8.0* 8.0*  --   MG  --  2.0  --     ABG: No results for input(s): PHART, PCO2ART, PO2ART, HCO3, O2SAT in the last 168 hours.  Liver Function Tests: Recent Labs  Lab 10/27/19 0613  AST 88*  ALT 153*  ALKPHOS 100  BILITOT 0.6  PROT 5.1*  ALBUMIN 1.5*   No results for input(s): LIPASE, AMYLASE in the last 168 hours. No results for input(s): AMMONIA in the last 168 hours.  CBC: Recent Labs  Lab 10/27/19 0613 11/01/19 0730  WBC 16.2* 9.4  NEUTROABS 12.7*  --   HGB 9.6* 9.7*  HCT 29.4* 29.2*  MCV 93.0 91.0  PLT 456* 540*    Cardiac Enzymes: No results for input(s): CKTOTAL, CKMB, CKMBINDEX, TROPONINI in the last 168 hours.  BNP (last 3  results) No results for input(s): BNP in the last 8760 hours.  ProBNP (last 3 results) No results for input(s): PROBNP in the last 8760 hours.  Radiological Exams: DG Abd Portable 1V  Result Date: 11/02/2019 CLINICAL DATA:  Ileus EXAM: PORTABLE ABDOMEN - 1 VIEW COMPARISON:  October 30, 2012 FINDINGS: There are mildly prominent air-filled loops of small bowel seen within the mid abdomen measuring up to 4.2 cm. This is not significantly changed since the prior exam. There is air down to the level the rectum. IMPRESSION: Unchanged mildly dilated air-filled loops of small bowel Electronically Signed   By: Prudencio Pair M.D.   On: 11/02/2019 06:44    Assessment/Plan Active Problems:   Acute on chronic respiratory failure with hypoxia (HCC)   Other sequelae of other nontraumatic intracranial hemorrhage   Arterial ischemic stroke, MCA (middle cerebral artery), left, acute (HCC)   Chronic kidney disease, stage II (mild)   Atelectasis pulmonary   1. Acute on chronic respiratory failure hypoxia plan is to continue with T collar titrate oxygen continue pulmonary toilet. 2. Nontraumatic intracranial hemorrhage 3. We will continue to follow along. 4. Chronic kidney disease stage II following labs 5. Pulmonary atelectasis continue with aggressive pulmonary toilet   I have personally seen  and evaluated the patient, evaluated laboratory and imaging results, formulated the assessment and plan and placed orders. The Patient requires high complexity decision making with multiple systems involvement.  Rounds were done with the Respiratory Therapy Director and Staff therapists and discussed with nursing staff also.  Allyne Gee, MD [facility] Pulmonary Critical Care Medicine Sleep Medicine

## 2019-11-03 LAB — POTASSIUM: Potassium: 4.1 mmol/L (ref 3.5–5.1)

## 2019-11-03 NOTE — Progress Notes (Addendum)
Pulmonary Critical Care Medicine [place]   PULMONARY CRITICAL CARE SERVICE  PROGRESS NOTE  Date of Service: 11/03/2019  Koi Zangara  [id number]  DOB: [birth year]   DOA: 10/26/2019  Referring Physician: Merton Border, MD  HPI: Riley Wagner is a 64 y.o. male seen for follow up of Acute on Chronic Respiratory Failure.  Patient remains on aerosol trach collar 28% FiO2 satting well this time has a goal of 20 hours.  Medications: Reviewed on Rounds  Physical Exam:  Vitals: Pulse 74 respirations 18 BP 146/65 O2 sat 100% temp 97.2  Ventilator Settings 20% ATC  . General: Comfortable at this time . Eyes: Grossly normal lids, irises & conjunctiva . ENT: grossly tongue is normal . Neck: no obvious mass . Cardiovascular: S1 S2 normal no gallop . Respiratory: No rales or rhonchi noted . Abdomen: soft . Skin: no rash seen on limited exam . Musculoskeletal: not rigid . Psychiatric:unable to assess . Neurologic: no seizure no involuntary movements         Lab Data:   Basic Metabolic Panel: Recent Labs  Lab 11/01/19 0730 11/02/19 0706 11/03/19 0410  NA 141  --   --   K 2.8* 3.4* 4.1  CL 110  --   --   CO2 24  --   --   GLUCOSE 113*  --   --   BUN 6*  --   --   CREATININE 1.03  --   --   CALCIUM 8.0*  --   --   MG 2.0  --   --     ABG: No results for input(s): PHART, PCO2ART, PO2ART, HCO3, O2SAT in the last 168 hours.  Liver Function Tests: No results for input(s): AST, ALT, ALKPHOS, BILITOT, PROT, ALBUMIN in the last 168 hours. No results for input(s): LIPASE, AMYLASE in the last 168 hours. No results for input(s): AMMONIA in the last 168 hours.  CBC: Recent Labs  Lab 11/01/19 0730  WBC 9.4  HGB 9.7*  HCT 29.2*  MCV 91.0  PLT 540*    Cardiac Enzymes: No results for input(s): CKTOTAL, CKMB, CKMBINDEX, TROPONINI in the last 168 hours.  BNP (last 3 results) No results for input(s): BNP in the last 8760 hours.  ProBNP (last 3  results) No results for input(s): PROBNP in the last 8760 hours.  Radiological Exams: DG Abd Portable 1V  Result Date: 11/02/2019 CLINICAL DATA:  Ileus EXAM: PORTABLE ABDOMEN - 1 VIEW COMPARISON:  October 30, 2012 FINDINGS: There are mildly prominent air-filled loops of small bowel seen within the mid abdomen measuring up to 4.2 cm. This is not significantly changed since the prior exam. There is air down to the level the rectum. IMPRESSION: Unchanged mildly dilated air-filled loops of small bowel Electronically Signed   By: Prudencio Pair M.D.   On: 11/02/2019 06:44    Assessment/Plan Active Problems:   Acute on chronic respiratory failure with hypoxia (HCC)   Other sequelae of other nontraumatic intracranial hemorrhage   Arterial ischemic stroke, MCA (middle cerebral artery), left, acute (HCC)   Chronic kidney disease, stage II (mild)   Atelectasis pulmonary   1. Acute on chronic respiratory failure hypoxia plan is to continue with aerosol trach collar 20-hour goal today continue aggressive pulmonary toilet supportive measures. 2. Nontraumatic intracranial hemorrhage 3. We will continue to follow along. 4. Chronic kidney disease stage II following labs 5. Pulmonary atelectasis continue with aggressive pulmonary toilet   I have personally seen and evaluated the  patient, evaluated laboratory and imaging results, formulated the assessment and plan and placed orders. The Patient requires high complexity decision making with multiple systems involvement.  Rounds were done with the Respiratory Therapy Director and Staff therapists and discussed with nursing staff also.  Allyne Gee, MD [facility] Pulmonary Critical Care Medicine Sleep Medicine

## 2019-11-04 ENCOUNTER — Other Ambulatory Visit (HOSPITAL_COMMUNITY): Payer: Medicare Other

## 2019-11-04 NOTE — Progress Notes (Addendum)
Pulmonary Critical Care Medicine [place]   PULMONARY CRITICAL CARE SERVICE  PROGRESS NOTE  Date of Service: 11/04/2019  Odis Turck  [id number]:[id number]  DOB: [birth year]   DOA: 10/26/2019  Referring Physician: Merton Border, MD  HPI: Quinten Allerton is a 64 y.o. male seen for follow up of Acute on Chronic Respiratory Failure.  Patient completed 24 hours of aerosol trach collar has a 48-hour goal at this time satting well no distress.  Medications: Reviewed on Rounds  Physical Exam:  Vitals: Pulse 86 respirations 26 BP 170/93 O2 sat 99% temp 90.0  Ventilator Settings ATC 28%  . General: Comfortable at this time . Eyes: Grossly normal lids, irises & conjunctiva . ENT: grossly tongue is normal . Neck: no obvious mass . Cardiovascular: S1 S2 normal no gallop . Respiratory: No rales or rhonchi noted . Abdomen: soft . Skin: no rash seen on limited exam . Musculoskeletal: not rigid . Psychiatric:unable to assess . Neurologic: no seizure no involuntary movements         Lab Data:   Basic Metabolic Panel: Recent Labs  Lab 11/01/19 0730 11/02/19 0706 11/03/19 0410  NA 141  --   --   K 2.8* 3.4* 4.1  CL 110  --   --   CO2 24  --   --   GLUCOSE 113*  --   --   BUN 6*  --   --   CREATININE 1.03  --   --   CALCIUM 8.0*  --   --   MG 2.0  --   --     ABG: No results for input(s): PHART, PCO2ART, PO2ART, HCO3, O2SAT in the last 168 hours.  Liver Function Tests: No results for input(s): AST, ALT, ALKPHOS, BILITOT, PROT, ALBUMIN in the last 168 hours. No results for input(s): LIPASE, AMYLASE in the last 168 hours. No results for input(s): AMMONIA in the last 168 hours.  CBC: Recent Labs  Lab 11/01/19 0730  WBC 9.4  HGB 9.7*  HCT 29.2*  MCV 91.0  PLT 540*    Cardiac Enzymes: No results for input(s): CKTOTAL, CKMB, CKMBINDEX, TROPONINI in the last 168 hours.  BNP (last 3 results) No results for input(s): BNP in the last 8760 hours.  ProBNP (last  3 results) No results for input(s): PROBNP in the last 8760 hours.  Radiological Exams: DG Abd 1 View  Result Date: 11/04/2019 CLINICAL DATA:  64 year old male with ileus. EXAM: ABDOMEN - 1 VIEW COMPARISON:  Portable abdomen 11/02/2019 and earlier. FINDINGS: Mildly rotated to the right AP views of the abdomen at 0544 hours. Percutaneous gastrostomy tube appears stable. Gas-filled but nondilated small and large bowel loops in the abdomen, mildly increased from last month. Lung bases appear to remain negative. No acute osseous abnormality identified. IMPRESSION: 1. Continued gas-filled, nondilated small and large bowel which appears increased compared to May and could reflect mild ileus. 2. No new finding.  Percutaneous gastrostomy. Electronically Signed   By: Genevie Ann M.D.   On: 11/04/2019 06:15    Assessment/Plan Active Problems:   Acute on chronic respiratory failure with hypoxia (HCC)   Other sequelae of other nontraumatic intracranial hemorrhage   Arterial ischemic stroke, MCA (middle cerebral artery), left, acute (HCC)   Chronic kidney disease, stage II (mild)   Atelectasis pulmonary   1. Acute on chronic respiratory failure hypoxia plan is to continue with aerosol trach collar 48-hour goal today continue aggressive pulmonary toilet supportive measures. 2. Nontraumatic intracranial hemorrhage 3.  We will continue to follow along. 4. Chronic kidney disease stage II following labs 5. Pulmonary atelectasis continue with aggressive pulmonary toilet   I have personally seen and evaluated the patient, evaluated laboratory and imaging results, formulated the assessment and plan and placed orders. The Patient requires high complexity decision making with multiple systems involvement.  Rounds were done with the Respiratory Therapy Director and Staff therapists and discussed with nursing staff also.  Allyne Gee, MD [facility] Pulmonary Critical Care Medicine Sleep Medicine

## 2019-11-05 LAB — CBC
HCT: 34.8 % — ABNORMAL LOW (ref 39.0–52.0)
Hemoglobin: 11.3 g/dL — ABNORMAL LOW (ref 13.0–17.0)
MCH: 30.1 pg (ref 26.0–34.0)
MCHC: 32.5 g/dL (ref 30.0–36.0)
MCV: 92.6 fL (ref 80.0–100.0)
Platelets: 717 10*3/uL — ABNORMAL HIGH (ref 150–400)
RBC: 3.76 MIL/uL — ABNORMAL LOW (ref 4.22–5.81)
RDW: 15.9 % — ABNORMAL HIGH (ref 11.5–15.5)
WBC: 13.7 10*3/uL — ABNORMAL HIGH (ref 4.0–10.5)
nRBC: 0 % (ref 0.0–0.2)

## 2019-11-05 LAB — BASIC METABOLIC PANEL
Anion gap: 10 (ref 5–15)
BUN: 8 mg/dL (ref 8–23)
CO2: 21 mmol/L — ABNORMAL LOW (ref 22–32)
Calcium: 8.7 mg/dL — ABNORMAL LOW (ref 8.9–10.3)
Chloride: 106 mmol/L (ref 98–111)
Creatinine, Ser: 1.04 mg/dL (ref 0.61–1.24)
GFR calc Af Amer: >60 mL/min (ref >60)
GFR calc non Af Amer: >60 mL/min (ref >60)
Glucose, Bld: 115 mg/dL — ABNORMAL HIGH (ref 70–99)
Potassium: 4.4 mmol/L (ref 3.5–5.1)
Sodium: 137 mmol/L (ref 135–145)

## 2019-11-05 LAB — MAGNESIUM: Magnesium: 2 mg/dL (ref 1.7–2.4)

## 2019-11-05 NOTE — Progress Notes (Signed)
Pulmonary Critical Care Medicine [place]   PULMONARY CRITICAL CARE SERVICE  PROGRESS NOTE  Date of Service: 11/05/2019  Riley Wagner  [id number]  DOB: Sep 09, 1955   DOA: 10/26/2019  Referring Physician: Merton Border, MD  HPI: Riley Wagner is a 64 y.o. male seen for follow up of Acute on Chronic Respiratory Failure.  Patient currently is on no T collar has been on 28% FiO2 the goal today will be for 48 hours total  Medications: Reviewed on Rounds  Physical Exam:  Vitals: Temperature is 96.4 pulse 98 respiratory rate 30 blood pressure is 178/64 saturations 98%  Ventilator Settings off the ventilator on T collar currently on 20% FiO2  . General: Comfortable at this time . Eyes: Grossly normal lids, irises & conjunctiva . ENT: grossly tongue is normal . Neck: no obvious mass . Cardiovascular: S1 S2 normal no gallop . Respiratory: No rhonchi coarse breath sounds . Abdomen: soft . Skin: no rash seen on limited exam . Musculoskeletal: not rigid . Psychiatric:unable to assess . Neurologic: no seizure no involuntary movements         Lab Data:   Basic Metabolic Panel: Recent Labs  Lab 11/01/19 0730 11/02/19 0706 11/03/19 0410 11/05/19 0904  NA 141  --   --  137  K 2.8* 3.4* 4.1 4.4  CL 110  --   --  106  CO2 24  --   --  21*  GLUCOSE 113*  --   --  115*  BUN 6*  --   --  8  CREATININE 1.03  --   --  1.04  CALCIUM 8.0*  --   --  8.7*  MG 2.0  --   --  2.0    ABG: No results for input(s): PHART, PCO2ART, PO2ART, HCO3, O2SAT in the last 168 hours.  Liver Function Tests: No results for input(s): AST, ALT, ALKPHOS, BILITOT, PROT, ALBUMIN in the last 168 hours. No results for input(s): LIPASE, AMYLASE in the last 168 hours. No results for input(s): AMMONIA in the last 168 hours.  CBC: Recent Labs  Lab 11/01/19 0730 11/05/19 0904  WBC 9.4 13.7*  HGB 9.7* 11.3*  HCT 29.2* 34.8*  MCV 91.0 92.6  PLT 540* 717*    Cardiac Enzymes: No  results for input(s): CKTOTAL, CKMB, CKMBINDEX, TROPONINI in the last 168 hours.  BNP (last 3 results) No results for input(s): BNP in the last 8760 hours.  ProBNP (last 3 results) No results for input(s): PROBNP in the last 8760 hours.  Radiological Exams: DG Abd 1 View  Result Date: 11/04/2019 CLINICAL DATA:  64 year old male with ileus. EXAM: ABDOMEN - 1 VIEW COMPARISON:  Portable abdomen 11/02/2019 and earlier. FINDINGS: Mildly rotated to the right AP views of the abdomen at 0544 hours. Percutaneous gastrostomy tube appears stable. Gas-filled but nondilated small and large bowel loops in the abdomen, mildly increased from last month. Lung bases appear to remain negative. No acute osseous abnormality identified. IMPRESSION: 1. Continued gas-filled, nondilated small and large bowel which appears increased compared to May and could reflect mild ileus. 2. No new finding.  Percutaneous gastrostomy. Electronically Signed   By: Genevie Ann M.D.   On: 11/04/2019 06:15    Assessment/Plan Active Problems:   Acute on chronic respiratory failure with hypoxia (HCC)   Other sequelae of other nontraumatic intracranial hemorrhage   Arterial ischemic stroke, MCA (middle cerebral artery), left, acute (HCC)   Chronic kidney disease, stage II (mild)   Atelectasis pulmonary  1. Acute on chronic respiratory failure with hypoxia we will continue weaning on T collar as tolerated. 2. Nontraumatic intracranial hemorrhage no change we will continue with supportive care 3. Acute ischemic stroke therapy as tolerated 4. Chronic kidney disease stage II following labs closely we will continue to do so. 5. Pulmonary atelectasis continue aggressive pulmonary toilet   I have personally seen and evaluated the patient, evaluated laboratory and imaging results, formulated the assessment and plan and placed orders. The Patient requires high complexity decision making with multiple systems involvement.  Rounds were done  with the Respiratory Therapy Director and Staff therapists and discussed with nursing staff also.  Allyne Gee, MD [facility] Pulmonary Critical Care Medicine Sleep Medicine

## 2019-11-06 LAB — BASIC METABOLIC PANEL
Anion gap: 10 (ref 5–15)
BUN: 7 mg/dL — ABNORMAL LOW (ref 8–23)
CO2: 21 mmol/L — ABNORMAL LOW (ref 22–32)
Calcium: 8.5 mg/dL — ABNORMAL LOW (ref 8.9–10.3)
Chloride: 105 mmol/L (ref 98–111)
Creatinine, Ser: 0.99 mg/dL (ref 0.61–1.24)
GFR calc Af Amer: >60 mL/min (ref >60)
GFR calc non Af Amer: >60 mL/min (ref >60)
Glucose, Bld: 114 mg/dL — ABNORMAL HIGH (ref 70–99)
Potassium: 4.1 mmol/L (ref 3.5–5.1)
Sodium: 136 mmol/L (ref 135–145)

## 2019-11-06 LAB — HEPATIC FUNCTION PANEL
ALT: 53 U/L — ABNORMAL HIGH (ref 0–44)
AST: 39 U/L (ref 15–41)
Albumin: 2 g/dL — ABNORMAL LOW (ref 3.5–5.0)
Alkaline Phosphatase: 76 U/L (ref 38–126)
Bilirubin, Direct: 0.2 mg/dL (ref 0.0–0.2)
Indirect Bilirubin: 0.4 mg/dL (ref 0.3–0.9)
Total Bilirubin: 0.6 mg/dL (ref 0.3–1.2)
Total Protein: 5.6 g/dL — ABNORMAL LOW (ref 6.5–8.1)

## 2019-11-06 LAB — CBC
HCT: 33.7 % — ABNORMAL LOW (ref 39.0–52.0)
Hemoglobin: 11.1 g/dL — ABNORMAL LOW (ref 13.0–17.0)
MCH: 30.7 pg (ref 26.0–34.0)
MCHC: 32.9 g/dL (ref 30.0–36.0)
MCV: 93.4 fL (ref 80.0–100.0)
Platelets: 690 10*3/uL — ABNORMAL HIGH (ref 150–400)
RBC: 3.61 MIL/uL — ABNORMAL LOW (ref 4.22–5.81)
RDW: 16.1 % — ABNORMAL HIGH (ref 11.5–15.5)
WBC: 11.6 10*3/uL — ABNORMAL HIGH (ref 4.0–10.5)
nRBC: 0 % (ref 0.0–0.2)

## 2019-11-06 NOTE — Progress Notes (Signed)
Pulmonary Critical Care Medicine [place]   PULMONARY CRITICAL CARE SERVICE  PROGRESS NOTE  Date of Service: 11/06/2019  Riley Wagner  QIO:[id number]  DOB: Feb 19, 1956   DOA: 10/26/2019  Referring Physician: Merton Border, MD  HPI: Riley Wagner is a 64 y.o. male seen for follow up of Acute on Chronic Respiratory Failure.  Patient currently is on T collar 28% FiO2 has some issues with secretions spoke with respiratory therapy on rounds to go ahead and change the tracheostomy  Medications: Reviewed on Rounds  Physical Exam:  Vitals: Temperature is 96.4 pulse 75 respiratory rate 32 blood pressure is 98/53 saturations 98%  Ventilator Settings off ventilator on T collar FiO2 28%  . General: Comfortable at this time . Eyes: Grossly normal lids, irises & conjunctiva . ENT: grossly tongue is normal . Neck: no obvious mass . Cardiovascular: S1 S2 normal no gallop . Respiratory: No rhonchi coarse breath sounds . Abdomen: soft . Skin: no rash seen on limited exam . Musculoskeletal: not rigid . Psychiatric:unable to assess . Neurologic: no seizure no involuntary movements         Lab Data:   Basic Metabolic Panel: Recent Labs  Lab 11/01/19 0730 11/02/19 0706 11/03/19 0410 11/05/19 0904 11/06/19 0818  NA 141  --   --  137 136  K 2.8* 3.4* 4.1 4.4 4.1  CL 110  --   --  106 105  CO2 24  --   --  21* 21*  GLUCOSE 113*  --   --  115* 114*  BUN 6*  --   --  8 7*  CREATININE 1.03  --   --  1.04 0.99  CALCIUM 8.0*  --   --  8.7* 8.5*  MG 2.0  --   --  2.0  --     ABG: No results for input(s): PHART, PCO2ART, PO2ART, HCO3, O2SAT in the last 168 hours.  Liver Function Tests: Recent Labs  Lab 11/06/19 0818  AST 39  ALT 53*  ALKPHOS 76  BILITOT 0.6  PROT 5.6*  ALBUMIN 2.0*   No results for input(s): LIPASE, AMYLASE in the last 168 hours. No results for input(s): AMMONIA in the last 168 hours.  CBC: Recent Labs  Lab 11/01/19 0730 11/05/19 0904  11/06/19 0818  WBC 9.4 13.7* 11.6*  HGB 9.7* 11.3* 11.1*  HCT 29.2* 34.8* 33.7*  MCV 91.0 92.6 93.4  PLT 540* 717* 690*    Cardiac Enzymes: No results for input(s): CKTOTAL, CKMB, CKMBINDEX, TROPONINI in the last 168 hours.  BNP (last 3 results) No results for input(s): BNP in the last 8760 hours.  ProBNP (last 3 results) No results for input(s): PROBNP in the last 8760 hours.  Radiological Exams: No results found.  Assessment/Plan Active Problems:   Acute on chronic respiratory failure with hypoxia (HCC)   Other sequelae of other nontraumatic intracranial hemorrhage   Arterial ischemic stroke, MCA (middle cerebral artery), left, acute (HCC)   Chronic kidney disease, stage II (mild)   Atelectasis pulmonary   1. Acute on chronic respiratory failure hypoxia plan is to continue with the weaning change of the trach to a cuffless trach and attempt capping if secretions allow 2. Nontraumatic intracranial hemorrhage no change we will continue with supportive care 3. Acute stroke physical therapy as tolerated 4. Chronic kidney disease monitoring labs 5. Pulmonary atelectasis aggressive pulmonary toilet   I have personally seen and evaluated the patient, evaluated laboratory and imaging results, formulated the assessment and  plan and placed orders. The Patient requires high complexity decision making with multiple systems involvement.  Rounds were done with the Respiratory Therapy Director and Staff therapists and discussed with nursing staff also.  Allyne Gee, MD [facility] Pulmonary Critical Care Medicine Sleep Medicine

## 2019-11-07 ENCOUNTER — Other Ambulatory Visit (HOSPITAL_COMMUNITY): Payer: Medicare Other

## 2019-11-07 NOTE — Progress Notes (Signed)
Pulmonary Critical Care Medicine [place]   PULMONARY CRITICAL CARE SERVICE  PROGRESS NOTE  Date of Service: 11/07/2019  Deavin Forst  YQM:[id number]  DOB: 1955/07/06   DOA: 10/26/2019  Referring Physician: Merton Border, MD  HPI: Carel Carrier is a 64 y.o. male seen for follow up of Acute on Chronic Respiratory Failure.  Patient is capping currently on room air today will be 24 hours  Medications: Reviewed on Rounds  Physical Exam:  Vitals: Temperature is 98.7 pulse 71 respiratory 19 blood pressure is 167/93 saturations 100%  Ventilator Settings capping on room air  . General: Comfortable at this time . Eyes: Grossly normal lids, irises & conjunctiva . ENT: grossly tongue is normal . Neck: no obvious mass . Cardiovascular: S1 S2 normal no gallop . Respiratory: No rhonchi no rales are noted . Abdomen: soft . Skin: no rash seen on limited exam . Musculoskeletal: not rigid . Psychiatric:unable to assess . Neurologic: no seizure no involuntary movements         Lab Data:   Basic Metabolic Panel: Recent Labs  Lab 11/01/19 0730 11/02/19 0706 11/03/19 0410 11/05/19 0904 11/06/19 0818  NA 141  --   --  137 136  K 2.8* 3.4* 4.1 4.4 4.1  CL 110  --   --  106 105  CO2 24  --   --  21* 21*  GLUCOSE 113*  --   --  115* 114*  BUN 6*  --   --  8 7*  CREATININE 1.03  --   --  1.04 0.99  CALCIUM 8.0*  --   --  8.7* 8.5*  MG 2.0  --   --  2.0  --     ABG: No results for input(s): PHART, PCO2ART, PO2ART, HCO3, O2SAT in the last 168 hours.  Liver Function Tests: Recent Labs  Lab 11/06/19 0818  AST 39  ALT 53*  ALKPHOS 76  BILITOT 0.6  PROT 5.6*  ALBUMIN 2.0*   No results for input(s): LIPASE, AMYLASE in the last 168 hours. No results for input(s): AMMONIA in the last 168 hours.  CBC: Recent Labs  Lab 11/01/19 0730 11/05/19 0904 11/06/19 0818  WBC 9.4 13.7* 11.6*  HGB 9.7* 11.3* 11.1*  HCT 29.2* 34.8* 33.7*  MCV 91.0 92.6 93.4  PLT 540*  717* 690*    Cardiac Enzymes: No results for input(s): CKTOTAL, CKMB, CKMBINDEX, TROPONINI in the last 168 hours.  BNP (last 3 results) No results for input(s): BNP in the last 8760 hours.  ProBNP (last 3 results) No results for input(s): PROBNP in the last 8760 hours.  Radiological Exams: DG Abd Portable 1V  Result Date: 11/07/2019 CLINICAL DATA:  Ileus. EXAM: PORTABLE ABDOMEN - 1 VIEW COMPARISON:  One-view abdomen 11/04/2019 FINDINGS: Mild gaseous distension of small bowel is similar prior exam. No obstruction is present. Gastrostomy tube is in place. Colonic gas is noted. IMPRESSION: Persistent mild gaseous distension of small bowel compatible with ileus. Electronically Signed   By: San Morelle M.D.   On: 11/07/2019 06:27    Assessment/Plan Active Problems:   Acute on chronic respiratory failure with hypoxia (HCC)   Other sequelae of other nontraumatic intracranial hemorrhage   Arterial ischemic stroke, MCA (middle cerebral artery), left, acute (HCC)   Chronic kidney disease, stage II (mild)   Atelectasis pulmonary   1. Acute on chronic respiratory failure hypoxia continue with capping trials titrate oxygen as tolerated 2. Nontraumatic intracranial hemorrhage no change we will continue  with supportive care 3. Acute stroke at baseline we will continue to follow 4. Chronic kidney disease stage III supportive care 5. Pulmonary atelectasis at baseline   I have personally seen and evaluated the patient, evaluated laboratory and imaging results, formulated the assessment and plan and placed orders. The Patient requires high complexity decision making with multiple systems involvement.  Rounds were done with the Respiratory Therapy Director and Staff therapists and discussed with nursing staff also.  Allyne Gee, MD [facility] Pulmonary Critical Care Medicine Sleep Medicine

## 2019-11-08 ENCOUNTER — Other Ambulatory Visit (HOSPITAL_COMMUNITY): Payer: Medicare Other

## 2019-11-08 LAB — CBC
HCT: 35.3 % — ABNORMAL LOW (ref 39.0–52.0)
Hemoglobin: 11.5 g/dL — ABNORMAL LOW (ref 13.0–17.0)
MCH: 30.1 pg (ref 26.0–34.0)
MCHC: 32.6 g/dL (ref 30.0–36.0)
MCV: 92.4 fL (ref 80.0–100.0)
Platelets: 758 10*3/uL — ABNORMAL HIGH (ref 150–400)
RBC: 3.82 MIL/uL — ABNORMAL LOW (ref 4.22–5.81)
RDW: 15.9 % — ABNORMAL HIGH (ref 11.5–15.5)
WBC: 9.2 10*3/uL (ref 4.0–10.5)
nRBC: 0 % (ref 0.0–0.2)

## 2019-11-08 LAB — BASIC METABOLIC PANEL
Anion gap: 9 (ref 5–15)
BUN: 7 mg/dL — ABNORMAL LOW (ref 8–23)
CO2: 23 mmol/L (ref 22–32)
Calcium: 8.8 mg/dL — ABNORMAL LOW (ref 8.9–10.3)
Chloride: 106 mmol/L (ref 98–111)
Creatinine, Ser: 1.19 mg/dL (ref 0.61–1.24)
GFR calc Af Amer: >60 mL/min (ref >60)
GFR calc non Af Amer: >60 mL/min (ref >60)
Glucose, Bld: 107 mg/dL — ABNORMAL HIGH (ref 70–99)
Potassium: 4.1 mmol/L (ref 3.5–5.1)
Sodium: 138 mmol/L (ref 135–145)

## 2019-11-08 LAB — MAGNESIUM: Magnesium: 2.1 mg/dL (ref 1.7–2.4)

## 2019-11-08 LAB — PHOSPHORUS: Phosphorus: 3.4 mg/dL (ref 2.5–4.6)

## 2019-11-08 NOTE — Progress Notes (Signed)
Pulmonary Critical Care Medicine [place]   PULMONARY CRITICAL CARE SERVICE  PROGRESS NOTE  Date of Service: 11/08/2019  Riley Wagner  CWC:[id number]  DOB: 03-Jan-1956   DOA: 10/26/2019  Referring Physician: Merton Border, MD  HPI: Riley Wagner is a 64 y.o. male seen for follow up of Acute on Chronic Respiratory Failure.  Patient is capping right now on room air good saturations are noted  Medications: Reviewed on Rounds  Physical Exam:  Vitals: Temperature 98.0 pulse 76 respiratory 27 blood pressure is 132/82 saturations 98%  Ventilator Settings capping on room air  . General: Comfortable at this time . Eyes: Grossly normal lids, irises & conjunctiva . ENT: grossly tongue is normal . Neck: no obvious mass . Cardiovascular: S1 S2 normal no gallop . Respiratory: No rhonchi no rales are noted at this time . Abdomen: soft . Skin: no rash seen on limited exam . Musculoskeletal: not rigid . Psychiatric:unable to assess . Neurologic: no seizure no involuntary movements         Lab Data:   Basic Metabolic Panel: Recent Labs  Lab 11/02/19 0706 11/03/19 0410 11/05/19 0904 11/06/19 0818 11/08/19 0652  NA  --   --  137 136 138  K 3.4* 4.1 4.4 4.1 4.1  CL  --   --  106 105 106  CO2  --   --  21* 21* 23  GLUCOSE  --   --  115* 114* 107*  BUN  --   --  8 7* 7*  CREATININE  --   --  1.04 0.99 1.19  CALCIUM  --   --  8.7* 8.5* 8.8*  MG  --   --  2.0  --  2.1  PHOS  --   --   --   --  3.4    ABG: No results for input(s): PHART, PCO2ART, PO2ART, HCO3, O2SAT in the last 168 hours.  Liver Function Tests: Recent Labs  Lab 11/06/19 0818  AST 39  ALT 53*  ALKPHOS 76  BILITOT 0.6  PROT 5.6*  ALBUMIN 2.0*   No results for input(s): LIPASE, AMYLASE in the last 168 hours. No results for input(s): AMMONIA in the last 168 hours.  CBC: Recent Labs  Lab 11/05/19 0904 11/06/19 0818 11/08/19 0652  WBC 13.7* 11.6* 9.2  HGB 11.3* 11.1* 11.5*  HCT 34.8*  33.7* 35.3*  MCV 92.6 93.4 92.4  PLT 717* 690* 758*    Cardiac Enzymes: No results for input(s): CKTOTAL, CKMB, CKMBINDEX, TROPONINI in the last 168 hours.  BNP (last 3 results) No results for input(s): BNP in the last 8760 hours.  ProBNP (last 3 results) No results for input(s): PROBNP in the last 8760 hours.  Radiological Exams: DG Abd Portable 1V  Result Date: 11/07/2019 CLINICAL DATA:  Ileus. EXAM: PORTABLE ABDOMEN - 1 VIEW COMPARISON:  One-view abdomen 11/04/2019 FINDINGS: Mild gaseous distension of small bowel is similar prior exam. No obstruction is present. Gastrostomy tube is in place. Colonic gas is noted. IMPRESSION: Persistent mild gaseous distension of small bowel compatible with ileus. Electronically Signed   By: San Morelle M.D.   On: 11/07/2019 06:27    Assessment/Plan Active Problems:   Acute on chronic respiratory failure with hypoxia (HCC)   Other sequelae of other nontraumatic intracranial hemorrhage   Arterial ischemic stroke, MCA (middle cerebral artery), left, acute (HCC)   Chronic kidney disease, stage II (mild)   Atelectasis pulmonary   1. Acute on chronic respiratory failure hypoxia  plan is to continue with the capping trials patient's goal is for 48 hours. 2. Nontraumatic intracranial hemorrhage no change we will continue with supportive care 3. Acute stroke no change continue present management 4. Chronic kidney disease stage II supportive care 5. Pulmonary atelectasis continue with aggressive pulmonary toilet   I have personally seen and evaluated the patient, evaluated laboratory and imaging results, formulated the assessment and plan and placed orders. The Patient requires high complexity decision making with multiple systems involvement.  Rounds were done with the Respiratory Therapy Director and Staff therapists and discussed with nursing staff also.  Allyne Gee, MD [facility] Pulmonary Critical Care Medicine Sleep Medicine

## 2019-11-09 NOTE — Progress Notes (Signed)
Pulmonary Critical Care Medicine [place]   PULMONARY CRITICAL CARE SERVICE  PROGRESS NOTE  Date of Service: 11/09/2019  Alastor Kneale  FAO:[id number]  DOB: [birth year]   DOA: 10/26/2019  Referring Physician: Merton Border, MD  HPI: Riley Wagner is a 64 y.o. male seen for follow up of Acute on Chronic Respiratory Failure.  Patient is doing fine with capping trials right now ready for decannulation  Medications: Reviewed on Rounds  Physical Exam:  Vitals: Temperature is 99.1 pulse 83 respiratory 23 blood pressure is 155/94 saturations 95%  Ventilator Settings capping off the vent   General: Comfortable at this time  Eyes: Grossly normal lids, irises & conjunctiva  ENT: grossly tongue is normal  Neck: no obvious mass  Cardiovascular: S1 S2 normal no gallop  Respiratory: No rhonchi no rales are noted at this time  Abdomen: soft  Skin: no rash seen on limited exam  Musculoskeletal: not rigid  Psychiatric:unable to assess  Neurologic: no seizure no involuntary movements         Lab Data:   Basic Metabolic Panel: Recent Labs  Lab 11/03/19 0410 11/05/19 0904 11/06/19 0818 11/08/19 0652  NA  --  137 136 138  K 4.1 4.4 4.1 4.1  CL  --  106 105 106  CO2  --  21* 21* 23  GLUCOSE  --  115* 114* 107*  BUN  --  8 7* 7*  CREATININE  --  1.04 0.99 1.19  CALCIUM  --  8.7* 8.5* 8.8*  MG  --  2.0  --  2.1  PHOS  --   --   --  3.4    ABG: No results for input(s): PHART, PCO2ART, PO2ART, HCO3, O2SAT in the last 168 hours.  Liver Function Tests: Recent Labs  Lab 11/06/19 0818  AST 39  ALT 53*  ALKPHOS 76  BILITOT 0.6  PROT 5.6*  ALBUMIN 2.0*   No results for input(s): LIPASE, AMYLASE in the last 168 hours. No results for input(s): AMMONIA in the last 168 hours.  CBC: Recent Labs  Lab 11/05/19 0904 11/06/19 0818 11/08/19 0652  WBC 13.7* 11.6* 9.2  HGB 11.3* 11.1* 11.5*  HCT 34.8* 33.7* 35.3*  MCV 92.6 93.4 92.4  PLT 717* 690*  758*    Cardiac Enzymes: No results for input(s): CKTOTAL, CKMB, CKMBINDEX, TROPONINI in the last 168 hours.  BNP (last 3 results) No results for input(s): BNP in the last 8760 hours.  ProBNP (last 3 results) No results for input(s): PROBNP in the last 8760 hours.  Radiological Exams: DG Abd Portable 1V  Result Date: 11/08/2019 CLINICAL DATA:  Ileus. EXAM: PORTABLE ABDOMEN -2 VIEW COMPARISON:  Prior study same day. FINDINGS: Persistent distention of small and large bowel consistent adynamic ileus. To exclude bowel obstruction follow-up exam suggested to demonstrate resolution. Pelvic calcifications consistent phleboliths. Degenerative change lumbar spine. IMPRESSION: Persistent distention of small and large bowel consistent adynamic ileus. To exclude bowel obstruction follow-up exam suggested to demonstrate resolution. Electronically Signed   By: Pine   On: 11/08/2019 11:53    Assessment/Plan Active Problems:   Acute on chronic respiratory failure with hypoxia (HCC)   Other sequelae of other nontraumatic intracranial hemorrhage   Arterial ischemic stroke, MCA (middle cerebral artery), left, acute (HCC)   Chronic kidney disease, stage II (mild)   Atelectasis pulmonary   1. Acute on chronic respiratory failure hypoxia we will proceed to decannulate today patient has had no significant secretions over the course  of the last 36 hours. 2. Nontraumatic intracranial hemorrhage no change we will continue with supportive care 3. Acute stroke patient is at baseline 4. Chronic kidney disease stage III we will continue to follow 5. Pulmonary atelectasis continue with pulmonary toilet   I have personally seen and evaluated the patient, evaluated laboratory and imaging results, formulated the assessment and plan and placed orders. The Patient requires high complexity decision making with multiple systems involvement.  Rounds were done with the Respiratory Therapy Director and Staff  therapists and discussed with nursing staff also.  Allyne Gee, MD [facility] Pulmonary Critical Care Medicine Sleep Medicine

## 2019-11-10 ENCOUNTER — Other Ambulatory Visit (HOSPITAL_COMMUNITY): Payer: Medicare Other

## 2019-11-10 LAB — BASIC METABOLIC PANEL
Anion gap: 8 (ref 5–15)
BUN: 10 mg/dL (ref 8–23)
CO2: 23 mmol/L (ref 22–32)
Calcium: 8.8 mg/dL — ABNORMAL LOW (ref 8.9–10.3)
Chloride: 104 mmol/L (ref 98–111)
Creatinine, Ser: 0.88 mg/dL (ref 0.61–1.24)
GFR calc Af Amer: >60 mL/min (ref >60)
GFR calc non Af Amer: >60 mL/min (ref >60)
Glucose, Bld: 122 mg/dL — ABNORMAL HIGH (ref 70–99)
Potassium: 3.9 mmol/L (ref 3.5–5.1)
Sodium: 135 mmol/L (ref 135–145)

## 2019-11-10 LAB — CBC
HCT: 34.7 % — ABNORMAL LOW (ref 39.0–52.0)
Hemoglobin: 11.3 g/dL — ABNORMAL LOW (ref 13.0–17.0)
MCH: 30.1 pg (ref 26.0–34.0)
MCHC: 32.6 g/dL (ref 30.0–36.0)
MCV: 92.3 fL (ref 80.0–100.0)
Platelets: 755 10*3/uL — ABNORMAL HIGH (ref 150–400)
RBC: 3.76 MIL/uL — ABNORMAL LOW (ref 4.22–5.81)
RDW: 15.9 % — ABNORMAL HIGH (ref 11.5–15.5)
WBC: 9.3 10*3/uL (ref 4.0–10.5)
nRBC: 0 % (ref 0.0–0.2)

## 2019-11-10 LAB — PHOSPHORUS: Phosphorus: 3.2 mg/dL (ref 2.5–4.6)

## 2019-11-10 LAB — MAGNESIUM: Magnesium: 2.1 mg/dL (ref 1.7–2.4)

## 2019-11-10 NOTE — Progress Notes (Addendum)
Pulmonary Critical Care Medicine [place]   PULMONARY CRITICAL CARE SERVICE  PROGRESS NOTE  Date of Service: 11/10/2019  Beuford Garcilazo  QIH:[id number]  DOB: 1955-10-27   DOA: 10/26/2019  Referring Physician: Merton Border, MD  HPI: Jhamal Plucinski is a 64 y.o. male seen for follow up of Acute on Chronic Respiratory Failure. Patient was decannulated yesterday remains on room air at this time satting well no distress.  Medications: Reviewed on Rounds  Physical Exam:  Vitals: 7 respirations 22 BP 109/64 O2 sat 98% temp 97.6  Ventilator Settings room air  . General: Comfortable at this time . Eyes: Grossly normal lids, irises & conjunctiva . ENT: grossly tongue is normal . Neck: no obvious mass . Cardiovascular: S1 S2 normal no gallop . Respiratory: No rales or rhonchi noted . Abdomen: soft . Skin: no rash seen on limited exam . Musculoskeletal: not rigid . Psychiatric:unable to assess . Neurologic: no seizure no involuntary movements         Lab Data:   Basic Metabolic Panel: Recent Labs  Lab 11/05/19 0904 11/06/19 0818 11/08/19 0652 11/10/19 0615  NA 137 136 138 135  K 4.4 4.1 4.1 3.9  CL 106 105 106 104  CO2 21* 21* 23 23  GLUCOSE 115* 114* 107* 122*  BUN 8 7* 7* 10  CREATININE 1.04 0.99 1.19 0.88  CALCIUM 8.7* 8.5* 8.8* 8.8*  MG 2.0  --  2.1 2.1  PHOS  --   --  3.4 3.2    ABG: No results for input(s): PHART, PCO2ART, PO2ART, HCO3, O2SAT in the last 168 hours.  Liver Function Tests: Recent Labs  Lab 11/06/19 0818  AST 39  ALT 53*  ALKPHOS 76  BILITOT 0.6  PROT 5.6*  ALBUMIN 2.0*   No results for input(s): LIPASE, AMYLASE in the last 168 hours. No results for input(s): AMMONIA in the last 168 hours.  CBC: Recent Labs  Lab 11/05/19 0904 11/06/19 0818 11/08/19 0652 11/10/19 0615  WBC 13.7* 11.6* 9.2 9.3  HGB 11.3* 11.1* 11.5* 11.3*  HCT 34.8* 33.7* 35.3* 34.7*  MCV 92.6 93.4 92.4 92.3  PLT 717* 690* 758* 755*    Cardiac  Enzymes: No results for input(s): CKTOTAL, CKMB, CKMBINDEX, TROPONINI in the last 168 hours.  BNP (last 3 results) No results for input(s): BNP in the last 8760 hours.  ProBNP (last 3 results) No results for input(s): PROBNP in the last 8760 hours.  Radiological Exams: DG Abd 1 View  Result Date: 11/10/2019 CLINICAL DATA:  Ileus EXAM: ABDOMEN - 1 VIEW COMPARISON:  Radiographs 11/08/2019 FINDINGS: Persisting gaseous distension seen throughout both large and small bowel, not significantly changed from comparison radiographs 2 days prior. Few phleboliths and vascular calcifications in the pelvis. No suspicious calcifications over the gallbladder fossa or urinary tract though evaluation limited due to the bowel gas. Degenerative changes present in the spine. No acute osseous abnormality. IMPRESSION: Persisting gaseous distension throughout both large and small bowel, compatible with ileus. Not significantly changed from comparison radiographs 2 days prior. Electronically Signed   By: Lovena Le M.D.   On: 11/10/2019 06:31    Assessment/Plan Active Problems:   Acute on chronic respiratory failure with hypoxia (HCC)   Other sequelae of other nontraumatic intracranial hemorrhage   Arterial ischemic stroke, MCA (middle cerebral artery), left, acute (HCC)   Chronic kidney disease, stage II (mild)   Atelectasis pulmonary   1. Acute on chronic respiratory failure hypoxia patient was decannulated yesterday doing well  today on room air. We will continue supportive measures 2. Nontraumatic intracranial hemorrhage no change we will continue with supportive care 3. Acute stroke patient is at baseline 4. Chronic kidney disease stage III we will continue to follow 5. Pulmonary atelectasis continue with pulmonary toilet   I have personally seen and evaluated the patient, evaluated laboratory and imaging results, formulated the assessment and plan and placed orders. The Patient requires high complexity  decision making with multiple systems involvement.  Rounds were done with the Respiratory Therapy Director and Staff therapists and discussed with nursing staff also.  Allyne Gee, MD [facility] Pulmonary Critical Care Medicine Sleep Medicine

## 2019-11-11 LAB — PHOSPHORUS: Phosphorus: 3.7 mg/dL (ref 2.5–4.6)

## 2019-11-11 LAB — MAGNESIUM: Magnesium: 2.1 mg/dL (ref 1.7–2.4)

## 2019-11-11 LAB — TRIGLYCERIDES: Triglycerides: 79 mg/dL (ref <150)

## 2019-11-11 NOTE — Progress Notes (Addendum)
Pulmonary Critical Care Medicine [place]   PULMONARY CRITICAL CARE SERVICE  PROGRESS NOTE  Date of Service: 11/11/2019  Riley Wagner  SWF:[id number]  DOB: 1955/07/18   DOA: 10/26/2019  Referring Physician: Merton Border, MD  HPI: Riley Wagner is a 64 y.o. male seen for follow up of Acute on Chronic Respiratory Failure. Patient is doing well post decannulation on room air satting well no distress.  Medications: Reviewed on Rounds  Physical Exam:  Vitals: Pulse 74 respirations 24 BP 115/62 O2 sat 98% temp 97.7  Ventilator Settings room air  . General: Comfortable at this time . Eyes: Grossly normal lids, irises & conjunctiva . ENT: grossly tongue is normal . Neck: no obvious mass . Cardiovascular: S1 S2 normal no gallop . Respiratory: No rales or rhonchi noted . Abdomen: soft . Skin: no rash seen on limited exam . Musculoskeletal: not rigid . Psychiatric:unable to assess . Neurologic: no seizure no involuntary movements         Lab Data:   Basic Metabolic Panel: Recent Labs  Lab 11/05/19 0904 11/06/19 0818 11/08/19 0652 11/10/19 0615 11/11/19 0643  NA 137 136 138 135  --   K 4.4 4.1 4.1 3.9  --   CL 106 105 106 104  --   CO2 21* 21* 23 23  --   GLUCOSE 115* 114* 107* 122*  --   BUN 8 7* 7* 10  --   CREATININE 1.04 0.99 1.19 0.88  --   CALCIUM 8.7* 8.5* 8.8* 8.8*  --   MG 2.0  --  2.1 2.1 2.1  PHOS  --   --  3.4 3.2 3.7    ABG: No results for input(s): PHART, PCO2ART, PO2ART, HCO3, O2SAT in the last 168 hours.  Liver Function Tests: Recent Labs  Lab 11/06/19 0818  AST 39  ALT 53*  ALKPHOS 76  BILITOT 0.6  PROT 5.6*  ALBUMIN 2.0*   No results for input(s): LIPASE, AMYLASE in the last 168 hours. No results for input(s): AMMONIA in the last 168 hours.  CBC: Recent Labs  Lab 11/05/19 0904 11/06/19 0818 11/08/19 0652 11/10/19 0615  WBC 13.7* 11.6* 9.2 9.3  HGB 11.3* 11.1* 11.5* 11.3*  HCT 34.8* 33.7* 35.3* 34.7*  MCV 92.6  93.4 92.4 92.3  PLT 717* 690* 758* 755*    Cardiac Enzymes: No results for input(s): CKTOTAL, CKMB, CKMBINDEX, TROPONINI in the last 168 hours.  BNP (last 3 results) No results for input(s): BNP in the last 8760 hours.  ProBNP (last 3 results) No results for input(s): PROBNP in the last 8760 hours.  Radiological Exams: DG Abd 1 View  Result Date: 11/10/2019 CLINICAL DATA:  Ileus EXAM: ABDOMEN - 1 VIEW COMPARISON:  Radiographs 11/08/2019 FINDINGS: Persisting gaseous distension seen throughout both large and small bowel, not significantly changed from comparison radiographs 2 days prior. Few phleboliths and vascular calcifications in the pelvis. No suspicious calcifications over the gallbladder fossa or urinary tract though evaluation limited due to the bowel gas. Degenerative changes present in the spine. No acute osseous abnormality. IMPRESSION: Persisting gaseous distension throughout both large and small bowel, compatible with ileus. Not significantly changed from comparison radiographs 2 days prior. Electronically Signed   By: Lovena Le M.D.   On: 11/10/2019 06:31    Assessment/Plan Active Problems:   Acute on chronic respiratory failure with hypoxia (HCC)   Other sequelae of other nontraumatic intracranial hemorrhage   Arterial ischemic stroke, MCA (middle cerebral artery), left, acute ([place])  Chronic kidney disease, stage II (mild)   Atelectasis pulmonary   1. Acute on chronic respiratory failure hypoxia patient continues doing well post decannulation on room air satting well no distress continue supportive measures. 2. Nontraumatic intracranial hemorrhage no change we will continue with supportive care 3. Acute stroke patient is at baseline 4. Chronic kidney disease stage III we will continue to follow 5. Pulmonary atelectasis continue with pulmonary toilet   I have personally seen and evaluated the patient, evaluated laboratory and imaging results, formulated the  assessment and plan and placed orders. The Patient requires high complexity decision making with multiple systems involvement.  Rounds were done with the Respiratory Therapy Director and Staff therapists and discussed with nursing staff also.  Allyne Gee, MD [facility] Pulmonary Critical Care Medicine Sleep Medicine

## 2019-11-12 ENCOUNTER — Other Ambulatory Visit (HOSPITAL_COMMUNITY): Payer: Medicare Other

## 2019-11-12 LAB — BASIC METABOLIC PANEL
Anion gap: 11 (ref 5–15)
BUN: 18 mg/dL (ref 8–23)
CO2: 22 mmol/L (ref 22–32)
Calcium: 9.1 mg/dL (ref 8.9–10.3)
Chloride: 101 mmol/L (ref 98–111)
Creatinine, Ser: 0.96 mg/dL (ref 0.61–1.24)
GFR calc Af Amer: >60 mL/min (ref >60)
GFR calc non Af Amer: >60 mL/min (ref >60)
Glucose, Bld: 131 mg/dL — ABNORMAL HIGH (ref 70–99)
Potassium: 4.2 mmol/L (ref 3.5–5.1)
Sodium: 134 mmol/L — ABNORMAL LOW (ref 135–145)

## 2019-11-12 LAB — CBC
HCT: 37.3 % — ABNORMAL LOW (ref 39.0–52.0)
Hemoglobin: 11.9 g/dL — ABNORMAL LOW (ref 13.0–17.0)
MCH: 29.6 pg (ref 26.0–34.0)
MCHC: 31.9 g/dL (ref 30.0–36.0)
MCV: 92.8 fL (ref 80.0–100.0)
Platelets: 739 10*3/uL — ABNORMAL HIGH (ref 150–400)
RBC: 4.02 MIL/uL — ABNORMAL LOW (ref 4.22–5.81)
RDW: 15.9 % — ABNORMAL HIGH (ref 11.5–15.5)
WBC: 11.8 10*3/uL — ABNORMAL HIGH (ref 4.0–10.5)
nRBC: 0 % (ref 0.0–0.2)

## 2019-11-12 LAB — MAGNESIUM: Magnesium: 2.1 mg/dL (ref 1.7–2.4)

## 2019-11-12 LAB — PHOSPHORUS: Phosphorus: 3.5 mg/dL (ref 2.5–4.6)

## 2019-11-12 NOTE — Progress Notes (Addendum)
Pulmonary Critical Care Medicine [place]   PULMONARY CRITICAL CARE SERVICE  PROGRESS NOTE  Date of Service: 11/12/2019  [name]  JYN:[id number]  DOB: 11-14-1955   DOA: 10/26/2019  Referring Physician: Merton Border, MD  HPI: Riley Wagner is a 64 y.o. male seen for follow up of Acute on Chronic Respiratory Failure. Once again patient remains on room air satting well no distress.  Medications: Reviewed on Rounds  Physical Exam:  Vitals: Pulse 75 respirations 22 BP 165/99 O2 sat 100% temp 98 7   Ventilator Settings room air  . General: Comfortable at this time . Eyes: Grossly normal lids, irises & conjunctiva . ENT: grossly tongue is normal . Neck: no obvious mass . Cardiovascular: S1 S2 normal no gallop . Respiratory: No rales or rhonchi noted . Abdomen: soft . Skin: no rash seen on limited exam . Musculoskeletal: not rigid . Psychiatric:unable to assess . Neurologic: no seizure no involuntary movements         Lab Data:   Basic Metabolic Panel: Recent Labs  Lab 11/06/19 0818 11/08/19 0652 11/10/19 0615 11/11/19 0643 11/12/19 0947  NA 136 138 135  --  134*  K 4.1 4.1 3.9  --  4.2  CL 105 106 104  --  101  CO2 21* 23 23  --  22  GLUCOSE 114* 107* 122*  --  131*  BUN 7* 7* 10  --  18  CREATININE 0.99 1.19 0.88  --  0.96  CALCIUM 8.5* 8.8* 8.8*  --  9.1  MG  --  2.1 2.1 2.1 2.1  PHOS  --  3.4 3.2 3.7 3.5    ABG: No results for input(s): PHART, PCO2ART, PO2ART, HCO3, O2SAT in the last 168 hours.  Liver Function Tests: Recent Labs  Lab 11/06/19 0818  AST 39  ALT 53*  ALKPHOS 76  BILITOT 0.6  PROT 5.6*  ALBUMIN 2.0*   No results for input(s): LIPASE, AMYLASE in the last 168 hours. No results for input(s): AMMONIA in the last 168 hours.  CBC: Recent Labs  Lab 11/06/19 0818 11/08/19 0652 11/10/19 0615 11/12/19 0947  WBC 11.6* 9.2 9.3 11.8*  HGB 11.1* 11.5* 11.3* 11.9*  HCT 33.7* 35.3* 34.7* 37.3*  MCV 93.4 92.4 92.3  92.8  PLT 690* 758* 755* 739*    Cardiac Enzymes: No results for input(s): CKTOTAL, CKMB, CKMBINDEX, TROPONINI in the last 168 hours.  BNP (last 3 results) No results for input(s): BNP in the last 8760 hours.  ProBNP (last 3 results) No results for input(s): PROBNP in the last 8760 hours.  Radiological Exams: DG Abd Portable 1V  Result Date: 11/12/2019 CLINICAL DATA:  Ileus. EXAM: PORTABLE ABDOMEN - 1 VIEW COMPARISON:  November 10, 2019 FINDINGS: Multiple air-filled loops of large and small bowel are similar to mildly improved. Gas is seen to the level the rectum. No other acute interval changes. IMPRESSION: Multiple mildly prominent air-filled loops of large and small bowel with gas extending in the level the rectum are identified. Mild ileus not excluded. No obstruction. Electronically Signed   By: Dorise Bullion III M.D   On: 11/12/2019 08:31    Assessment/Plan Active Problems:   Acute on chronic respiratory failure with hypoxia ([place])   Other sequelae of other nontraumatic intracranial hemorrhage   Arterial ischemic stroke, MCA (middle cerebral artery), left, acute (HCC)   Chronic kidney disease, stage II (mild)   Atelectasis pulmonary   1. Acute on chronic respiratory failure hypoxia patient has  done well post decannulation continue supportive measures at this time remains on room air. 2. Nontraumatic intracranial hemorrhage no change we will continue with supportive care 3. Acute stroke patient is at baseline 4. Chronic kidney disease stage III we will continue to follow 5. Pulmonary atelectasis continue with pulmonary toilet   I have personally seen and evaluated the patient, evaluated laboratory and imaging results, formulated the assessment and plan and placed orders. The Patient requires high complexity decision making with multiple systems involvement.  Rounds were done with the Respiratory Therapy Director and Staff therapists and discussed with nursing staff  also.  Allyne Gee, MD [facility] Pulmonary Critical Care Medicine Sleep Medicine

## 2019-11-14 ENCOUNTER — Other Ambulatory Visit (HOSPITAL_COMMUNITY): Payer: Medicare Other

## 2019-11-14 LAB — URINALYSIS, ROUTINE W REFLEX MICROSCOPIC
Bilirubin Urine: NEGATIVE
Glucose, UA: NEGATIVE mg/dL
Hgb urine dipstick: NEGATIVE
Ketones, ur: NEGATIVE mg/dL
Leukocytes,Ua: NEGATIVE
Nitrite: NEGATIVE
Protein, ur: NEGATIVE mg/dL
Specific Gravity, Urine: 1.019 (ref 1.005–1.030)
pH: 6 (ref 5.0–8.0)

## 2019-11-14 LAB — COMPREHENSIVE METABOLIC PANEL
ALT: 28 U/L (ref 0–44)
AST: 27 U/L (ref 15–41)
Albumin: 2.1 g/dL — ABNORMAL LOW (ref 3.5–5.0)
Alkaline Phosphatase: 82 U/L (ref 38–126)
Anion gap: 9 (ref 5–15)
BUN: 27 mg/dL — ABNORMAL HIGH (ref 8–23)
CO2: 20 mmol/L — ABNORMAL LOW (ref 22–32)
Calcium: 8.4 mg/dL — ABNORMAL LOW (ref 8.9–10.3)
Chloride: 99 mmol/L (ref 98–111)
Creatinine, Ser: 1.06 mg/dL (ref 0.61–1.24)
GFR calc Af Amer: >60 mL/min (ref >60)
GFR calc non Af Amer: >60 mL/min (ref >60)
Glucose, Bld: 154 mg/dL — ABNORMAL HIGH (ref 70–99)
Potassium: 4.1 mmol/L (ref 3.5–5.1)
Sodium: 128 mmol/L — ABNORMAL LOW (ref 135–145)
Total Bilirubin: 0.5 mg/dL (ref 0.3–1.2)
Total Protein: 6 g/dL — ABNORMAL LOW (ref 6.5–8.1)

## 2019-11-14 LAB — MAGNESIUM: Magnesium: 1.9 mg/dL (ref 1.7–2.4)

## 2019-11-14 LAB — CBC
HCT: 34.1 % — ABNORMAL LOW (ref 39.0–52.0)
Hemoglobin: 11.1 g/dL — ABNORMAL LOW (ref 13.0–17.0)
MCH: 29.8 pg (ref 26.0–34.0)
MCHC: 32.6 g/dL (ref 30.0–36.0)
MCV: 91.4 fL (ref 80.0–100.0)
Platelets: 597 10*3/uL — ABNORMAL HIGH (ref 150–400)
RBC: 3.73 MIL/uL — ABNORMAL LOW (ref 4.22–5.81)
RDW: 15.6 % — ABNORMAL HIGH (ref 11.5–15.5)
WBC: 9.6 10*3/uL (ref 4.0–10.5)
nRBC: 0 % (ref 0.0–0.2)

## 2019-11-14 LAB — PHOSPHORUS: Phosphorus: 2.4 mg/dL — ABNORMAL LOW (ref 2.5–4.6)

## 2019-11-14 LAB — BASIC METABOLIC PANEL
Anion gap: 10 (ref 5–15)
BUN: 22 mg/dL (ref 8–23)
CO2: 21 mmol/L — ABNORMAL LOW (ref 22–32)
Calcium: 8.7 mg/dL — ABNORMAL LOW (ref 8.9–10.3)
Chloride: 100 mmol/L (ref 98–111)
Creatinine, Ser: 0.91 mg/dL (ref 0.61–1.24)
GFR calc Af Amer: >60 mL/min (ref >60)
GFR calc non Af Amer: >60 mL/min (ref >60)
Glucose, Bld: 147 mg/dL — ABNORMAL HIGH (ref 70–99)
Potassium: 4.2 mmol/L (ref 3.5–5.1)
Sodium: 131 mmol/L — ABNORMAL LOW (ref 135–145)

## 2019-11-14 LAB — CK: Total CK: 115 U/L (ref 49–397)

## 2019-11-14 LAB — HEPATIC FUNCTION PANEL
ALT: 28 U/L (ref 0–44)
AST: 29 U/L (ref 15–41)
Albumin: 2.2 g/dL — ABNORMAL LOW (ref 3.5–5.0)
Alkaline Phosphatase: 86 U/L (ref 38–126)
Bilirubin, Direct: <0.1 mg/dL (ref 0.0–0.2)
Total Bilirubin: 0.7 mg/dL (ref 0.3–1.2)
Total Protein: 6.3 g/dL — ABNORMAL LOW (ref 6.5–8.1)

## 2019-11-14 NOTE — Consult Note (Signed)
Infectious Disease Consultation   Riley Wagner  VHQ:[id number]  DOB: 1956-01-28  DOA: 10/26/2019  Requesting physician: Gaspar Bidding  Reason for consultation: Antibiotic recommendations   History of Present Illness: Riley Wagner is an 64 y.o. male who [facility].  He was admitted to the outside hospital on 10/02/2019 with hypertensive crisis, aphasia and right-sided weakness.  He was found to have left ICA occlusion and underwent thrombectomy with subsequent left-sided intraparenchymal bleed along the posterior limb of the internal capsule as well as right-sided intraparenchymal bleed.  Decision was made to embolize the left ICA terminus.  He was admitted to the ICU at the referring facility and was in the ICU for approximately 24 days.  He was intubated postprocedure and was extubated on 10/07/2019 but then deteriorated again on 10/19/2019 requiring transfer to the ICU for reintubation.  He received tracheostomy on 10/20/2019 and PEG tube on 10/14/2019.  Patient was slowly weaning from mechanical ventilator and tolerating tube feeds.  He was treated with antibiotics for fevers on 10/22/2019.  [facility] he started having fevers again and was started on empiric Zosyn.  He completed treatment with Zosyn.  However, he started having fevers again of 101 and started on cefepime.  Blood culture showing gram-negative rods.  Final cultures and sensitivities pending at this time.   Review of Systems:  Patient nonverbal, unable to obtain review of systems at this time.   Past Medical History: Past Medical History:  Diagnosis Date  . Acute on chronic respiratory failure with hypoxia ([place])   . Arterial ischemic stroke, MCA (middle cerebral artery), left, acute ([place])   . Atelectasis pulmonary   . Chronic kidney disease, stage II (mild)   . Hypertension   . MI (myocardial infarction) ([place])   . Other sequelae of other nontraumatic intracranial hemorrhage     Past  Surgical History: Past Surgical History:  Procedure Laterality Date  . BACK SURGERY     2004  . ESOPHAGOGASTRODUODENOSCOPY N/A 10/14/2019   Procedure: ESOPHAGOGASTRODUODENOSCOPY (EGD);  Surgeon: Jesusita Oka, MD;  Location: [facility];  Service: General;  Laterality: N/A;  . IR ANGIOGRAM FOLLOW UP STUDY  10/02/2019  . IR CT HEAD LTD  10/02/2019  . IR CT HEAD LTD  10/02/2019  . IR PERCUTANEOUS ART THROMBECTOMY/INFUSION INTRACRANIAL INC DIAG ANGIO  10/02/2019  . IR PTA INTRACRANIAL  10/02/2019  . IR TRANSCATH/EMBOLIZ  10/02/2019  . IR US GUIDE VASC ACCESS RIGHT  10/02/2019  . PEG PLACEMENT N/A 10/14/2019   Procedure: PERCUTANEOUS ENDOSCOPIC GASTROSTOMY (PEG) PLACEMENT;  Surgeon: Jesusita Oka, MD;  Location: [place];  Service: General;  Laterality: N/A;  . RADIOLOGY WITH ANESTHESIA N/A 10/02/2019   Procedure: IR WITH ANESTHESIA;  Surgeon: Radiologist, Medication, MD;  Location: [place] [facility];  Service: Radiology;  Laterality: N/A;     Allergies:  No Known Allergies   Social History: Patient nonverbal.  Obtained from the chart.  reports that he quit smoking about 5 years ago. His smoking use included cigarettes. He has a 5.00 pack-year smoking history. He has never used smokeless tobacco. He reports current alcohol use. He reports that he does not use drugs.   Family History: Family History  Problem Relation Age of Onset  . Hypertension Mother   . Diabetes Mother   . Stroke Mother    Physical Exam: Vitals: Temperature 101, pulse 111, respiratory rate 32, blood pressure 128/79, pulse oximetry 100% Constitutional:  Ill-appearing male awake but nonverbal, not following commands Eyes: PERLA  ENMT: external ears and nose appear normal, Lips appears normal Neck: Supple, no masses CVS: S1-S2 Respiratory: Decreased breath sound lower lobes, rhonchi, no wheezes Abdomen: soft, nondistended, normal bowel sounds, PEG tube in place Musculoskeletal: Trace lower extremity edema Neuro: Patient  not following any commands at this time.  Unable to do a neurologic exam. Psych: Nonverbal unable to assess at this time Skin: no rashes  Data reviewed:  I have personally reviewed following labs and imaging studies Labs:  CBC: Recent Labs  Lab 11/08/19 0652 11/10/19 0615 11/12/19 0947 11/14/19 0651  WBC 9.2 9.3 11.8* 9.6  HGB 11.5* 11.3* 11.9* 11.1*  HCT 35.3* 34.7* 37.3* 34.1*  MCV 92.4 92.3 92.8 91.4  PLT 758* 755* 739* 597*    Basic Metabolic Panel: Recent Labs  Lab 11/08/19 0652 11/08/19 0652 11/10/19 0615 11/10/19 0615 11/11/19 0643 11/12/19 0947 11/14/19 0651  NA 138  --  135  --   --  134* 131*  K 4.1   < > 3.9   < >  --  4.2 4.2  CL 106  --  104  --   --  101 100  CO2 23  --  23  --   --  22 21*  GLUCOSE 107*  --  122*  --   --  131* 147*  BUN 7*  --  10  --   --  18 22  CREATININE 1.19  --  0.88  --   --  0.96 0.91  CALCIUM 8.8*  --  8.8*  --   --  9.1 8.7*  MG 2.1  --  2.1  --  2.1 2.1 1.9  PHOS 3.4  --  3.2  --  3.7 3.5 2.4*   < > = values in this interval not displayed.   GFR CrCl cannot be calculated (Unknown ideal weight.). Liver Function Tests: Recent Labs  Lab 11/14/19 1525  AST 29  ALT 28  ALKPHOS 86  BILITOT 0.7  PROT 6.3*  ALBUMIN 2.2*   No results for input(s): LIPASE, AMYLASE in the last 168 hours. No results for input(s): AMMONIA in the last 168 hours. Coagulation profile No results for input(s): INR, PROTIME in the last 168 hours.  Cardiac Enzymes: Recent Labs  Lab 11/14/19 1525  CKTOTAL 115   BNP: Invalid input(s): POCBNP CBG: No results for input(s): GLUCAP in the last 168 hours. D-Dimer No results for input(s): DDIMER in the last 72 hours. Hgb A1c No results for input(s): HGBA1C in the last 72 hours. Lipid Profile No results for input(s): CHOL, HDL, LDLCALC, TRIG, CHOLHDL, LDLDIRECT in the last 72 hours. Thyroid function studies No results for input(s): TSH, T4TOTAL, T3FREE, THYROIDAB in the last 72  hours.  Invalid input(s): FREET3 Anemia work up No results for input(s): VITAMINB12, FOLATE, FERRITIN, TIBC, IRON, RETICCTPCT in the last 72 hours. Urinalysis    Component Value Date/Time   COLORURINE YELLOW 11/14/2019 1130   APPEARANCEUR CLEAR 11/14/2019 1130   LABSPEC 1.019 11/14/2019 1130   PHURINE 6.0 11/14/2019 1130   GLUCOSEU NEGATIVE 11/14/2019 1130   HGBUR NEGATIVE 11/14/2019 1130   BILIRUBINUR NEGATIVE 11/14/2019 1130   KETONESUR NEGATIVE 11/14/2019 1130   PROTEINUR NEGATIVE 11/14/2019 1130   NITRITE NEGATIVE 11/14/2019 [place] 11/14/2019 1130     Microbiology Recent Results (from the past 240 hour(s))  Culture, blood (Routine X 2) w Reflex to ID Panel  Status: None (Preliminary result)   Collection Time: 11/14/19 10:08 AM   Specimen: BLOOD RIGHT HAND  Result Value Ref Range Status   Specimen Description BLOOD RIGHT HAND  Final   Special Requests AEROBIC BOTTLE ONLY Blood Culture adequate volume  Final   Culture   Final    NO GROWTH < 12 HOURS Performed at [facility], [place] [address], [place], [place] [address]    Report Status PENDING  Incomplete  Culture, blood (Routine X 2) w Reflex to ID Panel     Status: None (Preliminary result)   Collection Time: 11/14/19 10:08 AM   Specimen: BLOOD RIGHT HAND  Result Value Ref Range Status   Specimen Description BLOOD RIGHT HAND  Final   Special Requests AEROBIC BOTTLE ONLY Blood Culture adequate volume  Final   Culture   Final    NO GROWTH < 12 HOURS Performed at [facility], [place] [address], [place], [place] [address]    Report Status PENDING  Incomplete  Culture, respiratory (non-expectorated)     Status: None (Preliminary result)   Collection Time: 11/14/19 11:50 AM   Specimen: Tracheal Aspirate; Respiratory  Result Value Ref Range Status   Specimen Description TRACHEAL ASPIRATE  Final   Special Requests NONE  Final   Gram Stain   Final    FEW WBC PRESENT, PREDOMINANTLY  PMN MODERATE SQUAMOUS EPITHELIAL CELLS PRESENT ABUNDANT GRAM NEGATIVE RODS ABUNDANT GRAM NEGATIVE COCCOBACILLI MODERATE GRAM POSITIVE COCCI RARE GRAM POSITIVE RODS Performed at [facility], [place] [address] [place], [place] [address]    Culture PENDING  Incomplete   Report Status PENDING  Incomplete    Inpatient Medications:   Scheduled Meds: Please see MAR   Radiological Exams on Admission: DG Abd 1 View  Result Date: 11/14/2019 CLINICAL DATA:  Ileus EXAM: ABDOMEN - 1 VIEW COMPARISON:  Two days ago FINDINGS: Generalized bowel gas which is similar to prior. Percutaneous gastrostomy tube in expected position. No concerning mass effect or gas collection. IMPRESSION: Generalized gas filling of bowel without distended segment. Electronically Signed   By: Monte Fantasia M.D.   On: 11/14/2019 06:25   DG CHEST PORT 1 VIEW  Result Date: 11/14/2019 CLINICAL DATA:  Fever. EXAM: PORTABLE CHEST 1 VIEW COMPARISON:  October 27, 2019. FINDINGS: The heart size and mediastinal contours are within normal limits. Both lungs are clear. The visualized skeletal structures are unremarkable. IMPRESSION: No active disease. Electronically Signed   By: Marijo Conception M.D.   On: 11/14/2019 09:28    Impression/Recommendations Active Problems:   Acute on chronic respiratory failure with hypoxia (HCC) Sepsis Gram-negative bacteremia Fever/leukocytosis Acute encephalopathy/other sequelae of other nontraumatic intracranial hemorrhage   Arterial ischemic stroke, MCA (middle cerebral artery), left, acute (HCC)   Chronic kidney disease, stage II (mild)   Atelectasis pulmonary Dysphagia Ileus  Acute on chronic respiratory failure with hypoxemia: This was a result of acute cerebrovascular accident with residual deficit and dysphagia.  He also was treated for pneumonia at the outside facility.  Now having fevers with leukocytosis concern for sepsis.  Chest x-ray does not show any acute findings.  Further  management of respiratory failure per the primary team.  Unfortunately due to his dysphagia he is at risk for aspiration and recurrent aspiration pneumonia.  Sepsis: Likely secondary to the gram-negative bacteremia.  Per the primary team he had a peripheral IV and likely superficial abscess.  Agree with removing the IV.  He is on cefepime.  Would recommend to switch him to ceftriaxone.  Would also recommend to add Flagyl for anaerobic coverage because he is at risk for aspiration and aspiration pneumonia.  Also suggest to look for other sources including gallbladder due to risk of cholecystitis.  Patient also apparently had elevated LFTs and was recently started on statins and had to be stopped.  Please check CK to evaluate for statin induced myositis.  Follow-up on the final cultures and adjust antibiotics accordingly.  If his peripheral IV site swelling does not get better suggest ultrasound to evaluate for likely abscess.  Gram-negative bacteremia: Likely source from the PIV site which has been removed.  Preliminary blood culture report showing Klebsiella.  Final identification and susceptibilities pending at this time.  Follow-up on the final susceptibility and adjust antibiotics accordingly.  At this time recommend treatment ceftriaxone and add Flagyl for anaerobic coverage as he is high risk for aspiration pneumonia.  Would also recommend to repeat blood cultures in the next 48 hours to document clearance.  Please monitor BUN/trending closely while on antibiotics.  Fever/leukocytosis: Likely secondary to the sepsis P antibiotics and plan as mentioned above.  Once the blood cultures are negative we will need treatment for 10-14 days for the bacteremia provided we have source control.  Acute encephalopathy: As a result of the stroke.  Continue supportive management per primary team.  Dysphagia: Unfortunately due to his dysphagia he is very high risk for ongoing aspiration and recurrent aspiration  pneumonia.  Further management per the primary team.  Ileus: Management per primary team.  Again due to his ileus and dysphagia high risk for aspiration and recurrent aspiration pneumonia.  Unfortunately due to his multiple complex medical problems he is high risk for worsening and decompensation.  Thank you for this consultation.  Plan of care discussed with the primary team and pharmacy.  Yaakov Guthrie M.D. 11/14/2019, 7:32 PM

## 2019-11-15 ENCOUNTER — Other Ambulatory Visit (HOSPITAL_COMMUNITY): Payer: Medicare Other

## 2019-11-15 LAB — BASIC METABOLIC PANEL
Anion gap: 9 (ref 5–15)
BUN: 28 mg/dL — ABNORMAL HIGH (ref 8–23)
CO2: 21 mmol/L — ABNORMAL LOW (ref 22–32)
Calcium: 8.8 mg/dL — ABNORMAL LOW (ref 8.9–10.3)
Chloride: 104 mmol/L (ref 98–111)
Creatinine, Ser: 1.02 mg/dL (ref 0.61–1.24)
GFR calc Af Amer: >60 mL/min (ref >60)
GFR calc non Af Amer: >60 mL/min (ref >60)
Glucose, Bld: 123 mg/dL — ABNORMAL HIGH (ref 70–99)
Potassium: 4.3 mmol/L (ref 3.5–5.1)
Sodium: 134 mmol/L — ABNORMAL LOW (ref 135–145)

## 2019-11-15 LAB — URINE CULTURE: Culture: NO GROWTH

## 2019-11-15 LAB — BLOOD CULTURE ID PANEL (REFLEXED)

## 2019-11-15 NOTE — Progress Notes (Signed)
   Post CVA IR intervention 10/02/19  G tube placed 5/28 in OR with Dr Bobbye Morton  RN calls saying G tube has completely dislodged - Last pm/early this am  Foley has been placed into tract [facility] requesting replacement/exchange  I have spoken to Daughter Sarah via phone; she has consented to procedure  We will bring to IR when can

## 2019-11-16 ENCOUNTER — Other Ambulatory Visit (HOSPITAL_COMMUNITY): Payer: Medicare Other

## 2019-11-16 HISTORY — PX: IR REPLC GASTRO/COLONIC TUBE PERCUT W/FLUORO: IMG2333

## 2019-11-16 LAB — CULTURE, BLOOD (ROUTINE X 2)
Special Requests: ADEQUATE
Special Requests: ADEQUATE

## 2019-11-16 LAB — CULTURE, RESPIRATORY W GRAM STAIN: Culture: NORMAL

## 2019-11-16 MED ORDER — LIDOCAINE VISCOUS HCL 2 % MT SOLN
OROMUCOSAL | Status: AC
  Filled 2019-11-16: qty 15

## 2019-11-16 MED ORDER — IOHEXOL 300 MG/ML  SOLN
50.0000 mL | Freq: Once | INTRAMUSCULAR | Status: AC | PRN
  Administered 2019-11-16: 10 mL

## 2019-11-16 NOTE — Procedures (Signed)
Pre procedural Dx: Inadvertent removal of G-tube Post procedural Dx: Same  Successful fluoroscopic guided replacement of exisitng 18 Fr gastrostomy tube.   The feeding tube is ready for immediate use.  EBL: None  Complications: None immediate.  Ronny Bacon, MD Pager #: [phone]

## 2019-11-17 ENCOUNTER — Other Ambulatory Visit (HOSPITAL_COMMUNITY): Payer: Medicare Other

## 2019-11-17 LAB — PHOSPHORUS: Phosphorus: 3.1 mg/dL (ref 2.5–4.6)

## 2019-11-17 LAB — MAGNESIUM: Magnesium: 1.9 mg/dL (ref 1.7–2.4)

## 2019-11-18 LAB — BASIC METABOLIC PANEL
Anion gap: 8 (ref 5–15)
BUN: 16 mg/dL (ref 8–23)
CO2: 23 mmol/L (ref 22–32)
Calcium: 8.8 mg/dL — ABNORMAL LOW (ref 8.9–10.3)
Chloride: 104 mmol/L (ref 98–111)
Creatinine, Ser: 0.75 mg/dL (ref 0.61–1.24)
Glucose, Bld: 121 mg/dL — ABNORMAL HIGH (ref 70–99)
Potassium: 3.7 mmol/L (ref 3.5–5.1)
Sodium: 135 mmol/L (ref 135–145)

## 2019-11-18 LAB — CBC
HCT: 31.6 % — ABNORMAL LOW (ref 39.0–52.0)
Hemoglobin: 10.4 g/dL — ABNORMAL LOW (ref 13.0–17.0)
MCH: 30.2 pg (ref 26.0–34.0)
MCHC: 32.9 g/dL (ref 30.0–36.0)
MCV: 91.9 fL (ref 80.0–100.0)
Platelets: 507 10*3/uL — ABNORMAL HIGH (ref 150–400)
RBC: 3.44 MIL/uL — ABNORMAL LOW (ref 4.22–5.81)
RDW: 15.5 % (ref 11.5–15.5)
WBC: 8.6 10*3/uL (ref 4.0–10.5)
nRBC: 0 % (ref 0.0–0.2)

## 2019-11-18 LAB — TRIGLYCERIDES: Triglycerides: 69 mg/dL (ref <150)

## 2019-11-18 LAB — MAGNESIUM: Magnesium: 2 mg/dL (ref 1.7–2.4)

## 2019-11-20 ENCOUNTER — Other Ambulatory Visit (HOSPITAL_COMMUNITY): Payer: Medicare Other

## 2019-11-20 LAB — BASIC METABOLIC PANEL
Anion gap: 8 (ref 5–15)
BUN: 16 mg/dL (ref 8–23)
CO2: 22 mmol/L (ref 22–32)
Calcium: 8.8 mg/dL — ABNORMAL LOW (ref 8.9–10.3)
Chloride: 107 mmol/L (ref 98–111)
Creatinine, Ser: 0.79 mg/dL (ref 0.61–1.24)
GFR calc Af Amer: >60 mL/min (ref >60)
GFR calc non Af Amer: >60 mL/min (ref >60)
Glucose, Bld: 119 mg/dL — ABNORMAL HIGH (ref 70–99)
Potassium: 3.5 mmol/L (ref 3.5–5.1)
Sodium: 137 mmol/L (ref 135–145)

## 2019-11-20 LAB — MAGNESIUM: Magnesium: 1.8 mg/dL (ref 1.7–2.4)

## 2019-11-20 LAB — PHOSPHORUS: Phosphorus: 3.3 mg/dL (ref 2.5–4.6)

## 2019-11-21 LAB — RENAL FUNCTION PANEL
Albumin: 2.3 g/dL — ABNORMAL LOW (ref 3.5–5.0)
Anion gap: 9 (ref 5–15)
BUN: 15 mg/dL (ref 8–23)
CO2: 24 mmol/L (ref 22–32)
Calcium: 8.7 mg/dL — ABNORMAL LOW (ref 8.9–10.3)
Chloride: 102 mmol/L (ref 98–111)
Creatinine, Ser: 0.79 mg/dL (ref 0.61–1.24)
GFR calc Af Amer: >60 mL/min (ref >60)
GFR calc non Af Amer: >60 mL/min (ref >60)
Glucose, Bld: 134 mg/dL — ABNORMAL HIGH (ref 70–99)
Phosphorus: 3.2 mg/dL (ref 2.5–4.6)
Potassium: 4 mmol/L (ref 3.5–5.1)
Sodium: 135 mmol/L (ref 135–145)

## 2019-11-22 ENCOUNTER — Other Ambulatory Visit (HOSPITAL_COMMUNITY): Payer: Medicare Other

## 2019-11-22 LAB — BASIC METABOLIC PANEL
Anion gap: 9 (ref 5–15)
BUN: 12 mg/dL (ref 8–23)
CO2: 24 mmol/L (ref 22–32)
Calcium: 8.6 mg/dL — ABNORMAL LOW (ref 8.9–10.3)
Chloride: 104 mmol/L (ref 98–111)
Creatinine, Ser: 0.7 mg/dL (ref 0.61–1.24)
GFR calc Af Amer: >60 mL/min (ref >60)
GFR calc non Af Amer: >60 mL/min (ref >60)
Glucose, Bld: 117 mg/dL — ABNORMAL HIGH (ref 70–99)
Potassium: 3.6 mmol/L (ref 3.5–5.1)
Sodium: 137 mmol/L (ref 135–145)

## 2019-11-22 LAB — CBC
HCT: 29.9 % — ABNORMAL LOW (ref 39.0–52.0)
Hemoglobin: 9.9 g/dL — ABNORMAL LOW (ref 13.0–17.0)
MCH: 30.8 pg (ref 26.0–34.0)
MCHC: 33.1 g/dL (ref 30.0–36.0)
MCV: 93.1 fL (ref 80.0–100.0)
Platelets: 661 10*3/uL — ABNORMAL HIGH (ref 150–400)
RBC: 3.21 MIL/uL — ABNORMAL LOW (ref 4.22–5.81)
RDW: 16.1 % — ABNORMAL HIGH (ref 11.5–15.5)
WBC: 11.1 10*3/uL — ABNORMAL HIGH (ref 4.0–10.5)
nRBC: 0 % (ref 0.0–0.2)

## 2019-11-22 LAB — MAGNESIUM: Magnesium: 1.9 mg/dL (ref 1.7–2.4)

## 2019-11-22 LAB — PHOSPHORUS: Phosphorus: 3.3 mg/dL (ref 2.5–4.6)

## 2019-11-23 LAB — MAGNESIUM: Magnesium: 2.1 mg/dL (ref 1.7–2.4)

## 2019-11-23 LAB — PHOSPHORUS: Phosphorus: 3.2 mg/dL (ref 2.5–4.6)

## 2019-11-24 ENCOUNTER — Other Ambulatory Visit (HOSPITAL_COMMUNITY): Payer: Medicare Other

## 2019-11-24 LAB — BASIC METABOLIC PANEL
Anion gap: 12 (ref 5–15)
BUN: 15 mg/dL (ref 8–23)
CO2: 19 mmol/L — ABNORMAL LOW (ref 22–32)
Calcium: 8.7 mg/dL — ABNORMAL LOW (ref 8.9–10.3)
Chloride: 103 mmol/L (ref 98–111)
Creatinine, Ser: 0.73 mg/dL (ref 0.61–1.24)
GFR calc Af Amer: >60 mL/min (ref >60)
GFR calc non Af Amer: >60 mL/min (ref >60)
Glucose, Bld: 136 mg/dL — ABNORMAL HIGH (ref 70–99)
Potassium: 4.3 mmol/L (ref 3.5–5.1)
Sodium: 134 mmol/L — ABNORMAL LOW (ref 135–145)

## 2019-11-24 LAB — CULTURE, BLOOD (ROUTINE X 2)
Culture: NO GROWTH
Culture: NO GROWTH
Special Requests: ADEQUATE
Special Requests: ADEQUATE

## 2019-11-24 LAB — CBC
HCT: 32.8 % — ABNORMAL LOW (ref 39.0–52.0)
Hemoglobin: 10.8 g/dL — ABNORMAL LOW (ref 13.0–17.0)
MCH: 29.8 pg (ref 26.0–34.0)
MCHC: 32.9 g/dL (ref 30.0–36.0)
MCV: 90.6 fL (ref 80.0–100.0)
Platelets: 689 10*3/uL — ABNORMAL HIGH (ref 150–400)
RBC: 3.62 MIL/uL — ABNORMAL LOW (ref 4.22–5.81)
RDW: 15.7 % — ABNORMAL HIGH (ref 11.5–15.5)
WBC: 10.2 10*3/uL (ref 4.0–10.5)
nRBC: 0 % (ref 0.0–0.2)

## 2019-11-26 LAB — PHOSPHORUS: Phosphorus: 3.6 mg/dL (ref 2.5–4.6)

## 2019-11-26 LAB — MAGNESIUM: Magnesium: 2 mg/dL (ref 1.7–2.4)

## 2019-11-27 ENCOUNTER — Other Ambulatory Visit (HOSPITAL_COMMUNITY): Payer: Medicare Other

## 2019-11-27 LAB — CBC
HCT: 33.4 % — ABNORMAL LOW (ref 39.0–52.0)
Hemoglobin: 10.8 g/dL — ABNORMAL LOW (ref 13.0–17.0)
MCH: 30 pg (ref 26.0–34.0)
MCHC: 32.3 g/dL (ref 30.0–36.0)
MCV: 92.8 fL (ref 80.0–100.0)
Platelets: 768 10*3/uL — ABNORMAL HIGH (ref 150–400)
RBC: 3.6 MIL/uL — ABNORMAL LOW (ref 4.22–5.81)
RDW: 15.9 % — ABNORMAL HIGH (ref 11.5–15.5)
WBC: 10.2 10*3/uL (ref 4.0–10.5)
nRBC: 0 % (ref 0.0–0.2)

## 2019-11-27 LAB — MAGNESIUM: Magnesium: 2 mg/dL (ref 1.7–2.4)

## 2019-11-27 LAB — BASIC METABOLIC PANEL
Anion gap: 8 (ref 5–15)
BUN: 18 mg/dL (ref 8–23)
CO2: 24 mmol/L (ref 22–32)
Calcium: 9.2 mg/dL (ref 8.9–10.3)
Chloride: 104 mmol/L (ref 98–111)
Creatinine, Ser: 0.62 mg/dL (ref 0.61–1.24)
GFR calc Af Amer: >60 mL/min (ref >60)
GFR calc non Af Amer: >60 mL/min (ref >60)
Glucose, Bld: 142 mg/dL — ABNORMAL HIGH (ref 70–99)
Potassium: 4 mmol/L (ref 3.5–5.1)
Sodium: 136 mmol/L (ref 135–145)

## 2019-11-27 LAB — PHOSPHORUS: Phosphorus: 3.4 mg/dL (ref 2.5–4.6)

## 2019-11-28 LAB — SARS CORONAVIRUS 2 (TAT 6-24 HRS): SARS Coronavirus 2: NEGATIVE

## 2019-11-28 LAB — PHOSPHORUS: Phosphorus: 3.8 mg/dL (ref 2.5–4.6)

## 2019-11-28 LAB — MAGNESIUM: Magnesium: 2.1 mg/dL (ref 1.7–2.4)

## 2019-11-29 DIAGNOSIS — R7881 Bacteremia: Secondary | ICD-10-CM | POA: Diagnosis not present

## 2019-11-29 DIAGNOSIS — Z20822 Contact with and (suspected) exposure to covid-19: Secondary | ICD-10-CM | POA: Diagnosis not present

## 2019-11-29 DIAGNOSIS — R059 Cough, unspecified: Secondary | ICD-10-CM | POA: Diagnosis not present

## 2019-11-29 DIAGNOSIS — R1310 Dysphagia, unspecified: Secondary | ICD-10-CM | POA: Diagnosis not present

## 2019-11-29 DIAGNOSIS — I63542 Cerebral infarction due to unspecified occlusion or stenosis of left cerebellar artery: Secondary | ICD-10-CM | POA: Diagnosis not present

## 2019-11-29 DIAGNOSIS — K567 Ileus, unspecified: Secondary | ICD-10-CM | POA: Diagnosis not present

## 2019-11-29 DIAGNOSIS — E119 Type 2 diabetes mellitus without complications: Secondary | ICD-10-CM | POA: Diagnosis not present

## 2019-11-29 DIAGNOSIS — L89890 Pressure ulcer of other site, unstageable: Secondary | ICD-10-CM | POA: Diagnosis not present

## 2019-11-29 DIAGNOSIS — I6522 Occlusion and stenosis of left carotid artery: Secondary | ICD-10-CM | POA: Diagnosis not present

## 2019-11-29 DIAGNOSIS — R5381 Other malaise: Secondary | ICD-10-CM | POA: Diagnosis not present

## 2019-11-29 DIAGNOSIS — I69351 Hemiplegia and hemiparesis following cerebral infarction affecting right dominant side: Secondary | ICD-10-CM | POA: Diagnosis not present

## 2019-11-29 DIAGNOSIS — R509 Fever, unspecified: Secondary | ICD-10-CM | POA: Diagnosis not present

## 2019-11-29 DIAGNOSIS — I69391 Dysphagia following cerebral infarction: Secondary | ICD-10-CM | POA: Diagnosis not present

## 2019-11-29 DIAGNOSIS — Z931 Gastrostomy status: Secondary | ICD-10-CM | POA: Diagnosis not present

## 2019-11-29 DIAGNOSIS — E46 Unspecified protein-calorie malnutrition: Secondary | ICD-10-CM | POA: Diagnosis not present

## 2019-11-29 DIAGNOSIS — I10 Essential (primary) hypertension: Secondary | ICD-10-CM | POA: Diagnosis not present

## 2019-11-29 DIAGNOSIS — G9340 Encephalopathy, unspecified: Secondary | ICD-10-CM | POA: Diagnosis not present

## 2019-11-29 DIAGNOSIS — I69891 Dysphagia following other cerebrovascular disease: Secondary | ICD-10-CM | POA: Diagnosis not present

## 2019-11-29 DIAGNOSIS — L98499 Non-pressure chronic ulcer of skin of other sites with unspecified severity: Secondary | ICD-10-CM | POA: Diagnosis not present

## 2019-11-29 DIAGNOSIS — M6281 Muscle weakness (generalized): Secondary | ICD-10-CM | POA: Diagnosis not present

## 2019-11-29 DIAGNOSIS — J9600 Acute respiratory failure, unspecified whether with hypoxia or hypercapnia: Secondary | ICD-10-CM | POA: Diagnosis not present

## 2019-11-29 DIAGNOSIS — H109 Unspecified conjunctivitis: Secondary | ICD-10-CM | POA: Diagnosis not present

## 2019-11-29 DIAGNOSIS — L89893 Pressure ulcer of other site, stage 3: Secondary | ICD-10-CM | POA: Diagnosis not present

## 2019-11-29 DIAGNOSIS — I69320 Aphasia following cerebral infarction: Secondary | ICD-10-CM | POA: Diagnosis not present

## 2019-12-01 DIAGNOSIS — R5381 Other malaise: Secondary | ICD-10-CM | POA: Diagnosis not present

## 2019-12-06 DIAGNOSIS — R5381 Other malaise: Secondary | ICD-10-CM | POA: Diagnosis not present

## 2019-12-08 DIAGNOSIS — R5381 Other malaise: Secondary | ICD-10-CM | POA: Diagnosis not present

## 2019-12-13 DIAGNOSIS — R5381 Other malaise: Secondary | ICD-10-CM | POA: Diagnosis not present

## 2019-12-15 DIAGNOSIS — R5381 Other malaise: Secondary | ICD-10-CM | POA: Diagnosis not present

## 2019-12-20 DIAGNOSIS — R5381 Other malaise: Secondary | ICD-10-CM | POA: Diagnosis not present

## 2019-12-28 DIAGNOSIS — R5381 Other malaise: Secondary | ICD-10-CM | POA: Diagnosis not present

## 2020-01-03 DIAGNOSIS — R5381 Other malaise: Secondary | ICD-10-CM | POA: Diagnosis not present

## 2020-01-05 DIAGNOSIS — R5381 Other malaise: Secondary | ICD-10-CM | POA: Diagnosis not present

## 2020-01-12 DIAGNOSIS — R5381 Other malaise: Secondary | ICD-10-CM | POA: Diagnosis not present

## 2020-01-17 DIAGNOSIS — R5381 Other malaise: Secondary | ICD-10-CM | POA: Diagnosis not present

## 2020-01-24 DIAGNOSIS — R5381 Other malaise: Secondary | ICD-10-CM | POA: Diagnosis not present

## 2020-01-25 ENCOUNTER — Other Ambulatory Visit: Payer: Self-pay

## 2020-01-25 NOTE — Patient Outreach (Signed)
[facility]) Care Management  01/25/2020  Riley Wagner [birth year] [id number]  First telephone outreach attempt to obtain mRS. No answer. Left message for returned call.  [facility] Management Assistant [phone]

## 2020-01-31 ENCOUNTER — Other Ambulatory Visit: Payer: Self-pay

## 2020-01-31 DIAGNOSIS — R5381 Other malaise: Secondary | ICD-10-CM | POA: Diagnosis not present

## 2020-01-31 NOTE — Patient Outreach (Addendum)
[facility]) Care Management  01/31/2020  Tevis Dunavan 1955-07-30 [id number]  Second telephone outreach attempt to obtain mRS. No answer. Left message for returned call.  [facility] Management Assistant [phone]

## 2020-02-03 ENCOUNTER — Other Ambulatory Visit: Payer: Self-pay

## 2020-02-03 NOTE — Patient Outreach (Signed)
[facility]) Care Management  02/03/2020  Riley Wagner 1955/10/15 [id number]   Telephone outreach to patient to obtain mRS was successfully completed. MRS=5  [facility] Management Assistant [phone]

## 2020-02-07 DIAGNOSIS — R5381 Other malaise: Secondary | ICD-10-CM | POA: Diagnosis not present

## 2020-02-09 DIAGNOSIS — L89890 Pressure ulcer of other site, unstageable: Secondary | ICD-10-CM | POA: Diagnosis not present

## 2020-02-16 DIAGNOSIS — L89890 Pressure ulcer of other site, unstageable: Secondary | ICD-10-CM | POA: Diagnosis not present

## 2020-02-21 DIAGNOSIS — R5381 Other malaise: Secondary | ICD-10-CM | POA: Diagnosis not present

## 2020-02-23 DIAGNOSIS — L89890 Pressure ulcer of other site, unstageable: Secondary | ICD-10-CM | POA: Diagnosis not present

## 2020-03-01 DIAGNOSIS — L98499 Non-pressure chronic ulcer of skin of other sites with unspecified severity: Secondary | ICD-10-CM | POA: Diagnosis not present

## 2020-03-01 DIAGNOSIS — L89890 Pressure ulcer of other site, unstageable: Secondary | ICD-10-CM | POA: Diagnosis not present

## 2020-03-06 DIAGNOSIS — R5381 Other malaise: Secondary | ICD-10-CM | POA: Diagnosis not present

## 2020-03-08 DIAGNOSIS — L89893 Pressure ulcer of other site, stage 3: Secondary | ICD-10-CM | POA: Diagnosis not present

## 2020-03-08 DIAGNOSIS — L98499 Non-pressure chronic ulcer of skin of other sites with unspecified severity: Secondary | ICD-10-CM | POA: Diagnosis not present

## 2020-03-15 DIAGNOSIS — L89893 Pressure ulcer of other site, stage 3: Secondary | ICD-10-CM | POA: Diagnosis not present

## 2020-03-15 DIAGNOSIS — L98499 Non-pressure chronic ulcer of skin of other sites with unspecified severity: Secondary | ICD-10-CM | POA: Diagnosis not present

## 2020-03-19 DIAGNOSIS — I69351 Hemiplegia and hemiparesis following cerebral infarction affecting right dominant side: Secondary | ICD-10-CM | POA: Diagnosis not present

## 2020-03-19 DIAGNOSIS — I63542 Cerebral infarction due to unspecified occlusion or stenosis of left cerebellar artery: Secondary | ICD-10-CM | POA: Diagnosis not present

## 2020-03-19 DIAGNOSIS — M6281 Muscle weakness (generalized): Secondary | ICD-10-CM | POA: Diagnosis not present

## 2020-03-20 DIAGNOSIS — I63542 Cerebral infarction due to unspecified occlusion or stenosis of left cerebellar artery: Secondary | ICD-10-CM | POA: Diagnosis not present

## 2020-03-20 DIAGNOSIS — M6281 Muscle weakness (generalized): Secondary | ICD-10-CM | POA: Diagnosis not present

## 2020-03-20 DIAGNOSIS — I69351 Hemiplegia and hemiparesis following cerebral infarction affecting right dominant side: Secondary | ICD-10-CM | POA: Diagnosis not present

## 2020-03-21 DIAGNOSIS — I63542 Cerebral infarction due to unspecified occlusion or stenosis of left cerebellar artery: Secondary | ICD-10-CM | POA: Diagnosis not present

## 2020-03-21 DIAGNOSIS — I69351 Hemiplegia and hemiparesis following cerebral infarction affecting right dominant side: Secondary | ICD-10-CM | POA: Diagnosis not present

## 2020-03-21 DIAGNOSIS — M6281 Muscle weakness (generalized): Secondary | ICD-10-CM | POA: Diagnosis not present

## 2020-03-22 DIAGNOSIS — L89893 Pressure ulcer of other site, stage 3: Secondary | ICD-10-CM | POA: Diagnosis not present

## 2020-03-22 DIAGNOSIS — I63542 Cerebral infarction due to unspecified occlusion or stenosis of left cerebellar artery: Secondary | ICD-10-CM | POA: Diagnosis not present

## 2020-03-22 DIAGNOSIS — I69351 Hemiplegia and hemiparesis following cerebral infarction affecting right dominant side: Secondary | ICD-10-CM | POA: Diagnosis not present

## 2020-03-22 DIAGNOSIS — L98499 Non-pressure chronic ulcer of skin of other sites with unspecified severity: Secondary | ICD-10-CM | POA: Diagnosis not present

## 2020-03-22 DIAGNOSIS — M6281 Muscle weakness (generalized): Secondary | ICD-10-CM | POA: Diagnosis not present

## 2020-03-23 DIAGNOSIS — M6281 Muscle weakness (generalized): Secondary | ICD-10-CM | POA: Diagnosis not present

## 2020-03-23 DIAGNOSIS — I63542 Cerebral infarction due to unspecified occlusion or stenosis of left cerebellar artery: Secondary | ICD-10-CM | POA: Diagnosis not present

## 2020-03-23 DIAGNOSIS — I69351 Hemiplegia and hemiparesis following cerebral infarction affecting right dominant side: Secondary | ICD-10-CM | POA: Diagnosis not present

## 2020-03-24 DIAGNOSIS — M6281 Muscle weakness (generalized): Secondary | ICD-10-CM | POA: Diagnosis not present

## 2020-03-24 DIAGNOSIS — I63542 Cerebral infarction due to unspecified occlusion or stenosis of left cerebellar artery: Secondary | ICD-10-CM | POA: Diagnosis not present

## 2020-03-24 DIAGNOSIS — I69351 Hemiplegia and hemiparesis following cerebral infarction affecting right dominant side: Secondary | ICD-10-CM | POA: Diagnosis not present

## 2020-03-27 DIAGNOSIS — I63542 Cerebral infarction due to unspecified occlusion or stenosis of left cerebellar artery: Secondary | ICD-10-CM | POA: Diagnosis not present

## 2020-03-27 DIAGNOSIS — M6281 Muscle weakness (generalized): Secondary | ICD-10-CM | POA: Diagnosis not present

## 2020-03-27 DIAGNOSIS — I69351 Hemiplegia and hemiparesis following cerebral infarction affecting right dominant side: Secondary | ICD-10-CM | POA: Diagnosis not present

## 2020-03-28 DIAGNOSIS — M6281 Muscle weakness (generalized): Secondary | ICD-10-CM | POA: Diagnosis not present

## 2020-03-28 DIAGNOSIS — I69351 Hemiplegia and hemiparesis following cerebral infarction affecting right dominant side: Secondary | ICD-10-CM | POA: Diagnosis not present

## 2020-03-28 DIAGNOSIS — I63542 Cerebral infarction due to unspecified occlusion or stenosis of left cerebellar artery: Secondary | ICD-10-CM | POA: Diagnosis not present

## 2020-03-29 DIAGNOSIS — I63542 Cerebral infarction due to unspecified occlusion or stenosis of left cerebellar artery: Secondary | ICD-10-CM | POA: Diagnosis not present

## 2020-03-29 DIAGNOSIS — L89893 Pressure ulcer of other site, stage 3: Secondary | ICD-10-CM | POA: Diagnosis not present

## 2020-03-29 DIAGNOSIS — I69351 Hemiplegia and hemiparesis following cerebral infarction affecting right dominant side: Secondary | ICD-10-CM | POA: Diagnosis not present

## 2020-03-29 DIAGNOSIS — M6281 Muscle weakness (generalized): Secondary | ICD-10-CM | POA: Diagnosis not present

## 2020-03-29 DIAGNOSIS — L98499 Non-pressure chronic ulcer of skin of other sites with unspecified severity: Secondary | ICD-10-CM | POA: Diagnosis not present

## 2020-03-30 DIAGNOSIS — I69351 Hemiplegia and hemiparesis following cerebral infarction affecting right dominant side: Secondary | ICD-10-CM | POA: Diagnosis not present

## 2020-03-30 DIAGNOSIS — M6281 Muscle weakness (generalized): Secondary | ICD-10-CM | POA: Diagnosis not present

## 2020-03-30 DIAGNOSIS — I63542 Cerebral infarction due to unspecified occlusion or stenosis of left cerebellar artery: Secondary | ICD-10-CM | POA: Diagnosis not present

## 2020-04-02 DIAGNOSIS — M6281 Muscle weakness (generalized): Secondary | ICD-10-CM | POA: Diagnosis not present

## 2020-04-02 DIAGNOSIS — I63542 Cerebral infarction due to unspecified occlusion or stenosis of left cerebellar artery: Secondary | ICD-10-CM | POA: Diagnosis not present

## 2020-04-02 DIAGNOSIS — I69351 Hemiplegia and hemiparesis following cerebral infarction affecting right dominant side: Secondary | ICD-10-CM | POA: Diagnosis not present

## 2020-04-03 DIAGNOSIS — R5381 Other malaise: Secondary | ICD-10-CM | POA: Diagnosis not present

## 2020-04-03 DIAGNOSIS — M6281 Muscle weakness (generalized): Secondary | ICD-10-CM | POA: Diagnosis not present

## 2020-04-03 DIAGNOSIS — I63542 Cerebral infarction due to unspecified occlusion or stenosis of left cerebellar artery: Secondary | ICD-10-CM | POA: Diagnosis not present

## 2020-04-03 DIAGNOSIS — I69351 Hemiplegia and hemiparesis following cerebral infarction affecting right dominant side: Secondary | ICD-10-CM | POA: Diagnosis not present

## 2020-04-04 DIAGNOSIS — I63542 Cerebral infarction due to unspecified occlusion or stenosis of left cerebellar artery: Secondary | ICD-10-CM | POA: Diagnosis not present

## 2020-04-04 DIAGNOSIS — M6281 Muscle weakness (generalized): Secondary | ICD-10-CM | POA: Diagnosis not present

## 2020-04-04 DIAGNOSIS — I69351 Hemiplegia and hemiparesis following cerebral infarction affecting right dominant side: Secondary | ICD-10-CM | POA: Diagnosis not present

## 2020-04-05 DIAGNOSIS — I63542 Cerebral infarction due to unspecified occlusion or stenosis of left cerebellar artery: Secondary | ICD-10-CM | POA: Diagnosis not present

## 2020-04-05 DIAGNOSIS — M6281 Muscle weakness (generalized): Secondary | ICD-10-CM | POA: Diagnosis not present

## 2020-04-05 DIAGNOSIS — L89893 Pressure ulcer of other site, stage 3: Secondary | ICD-10-CM | POA: Diagnosis not present

## 2020-04-05 DIAGNOSIS — I69351 Hemiplegia and hemiparesis following cerebral infarction affecting right dominant side: Secondary | ICD-10-CM | POA: Diagnosis not present

## 2020-04-05 DIAGNOSIS — L98499 Non-pressure chronic ulcer of skin of other sites with unspecified severity: Secondary | ICD-10-CM | POA: Diagnosis not present

## 2020-04-06 DIAGNOSIS — I63542 Cerebral infarction due to unspecified occlusion or stenosis of left cerebellar artery: Secondary | ICD-10-CM | POA: Diagnosis not present

## 2020-04-06 DIAGNOSIS — M6281 Muscle weakness (generalized): Secondary | ICD-10-CM | POA: Diagnosis not present

## 2020-04-06 DIAGNOSIS — I69351 Hemiplegia and hemiparesis following cerebral infarction affecting right dominant side: Secondary | ICD-10-CM | POA: Diagnosis not present

## 2020-04-12 DIAGNOSIS — L98499 Non-pressure chronic ulcer of skin of other sites with unspecified severity: Secondary | ICD-10-CM | POA: Diagnosis not present

## 2020-04-12 DIAGNOSIS — L89893 Pressure ulcer of other site, stage 3: Secondary | ICD-10-CM | POA: Diagnosis not present

## 2020-04-19 DIAGNOSIS — L98499 Non-pressure chronic ulcer of skin of other sites with unspecified severity: Secondary | ICD-10-CM | POA: Diagnosis not present

## 2020-10-30 IMAGING — DX DG CHEST 1V PORT
1 series · 1 of 1 positions shown · non-contrast
Comparison: October 27, 2019.

CLINICAL DATA: Fever.

EXAM:
PORTABLE CHEST 1 VIEW

[chest]
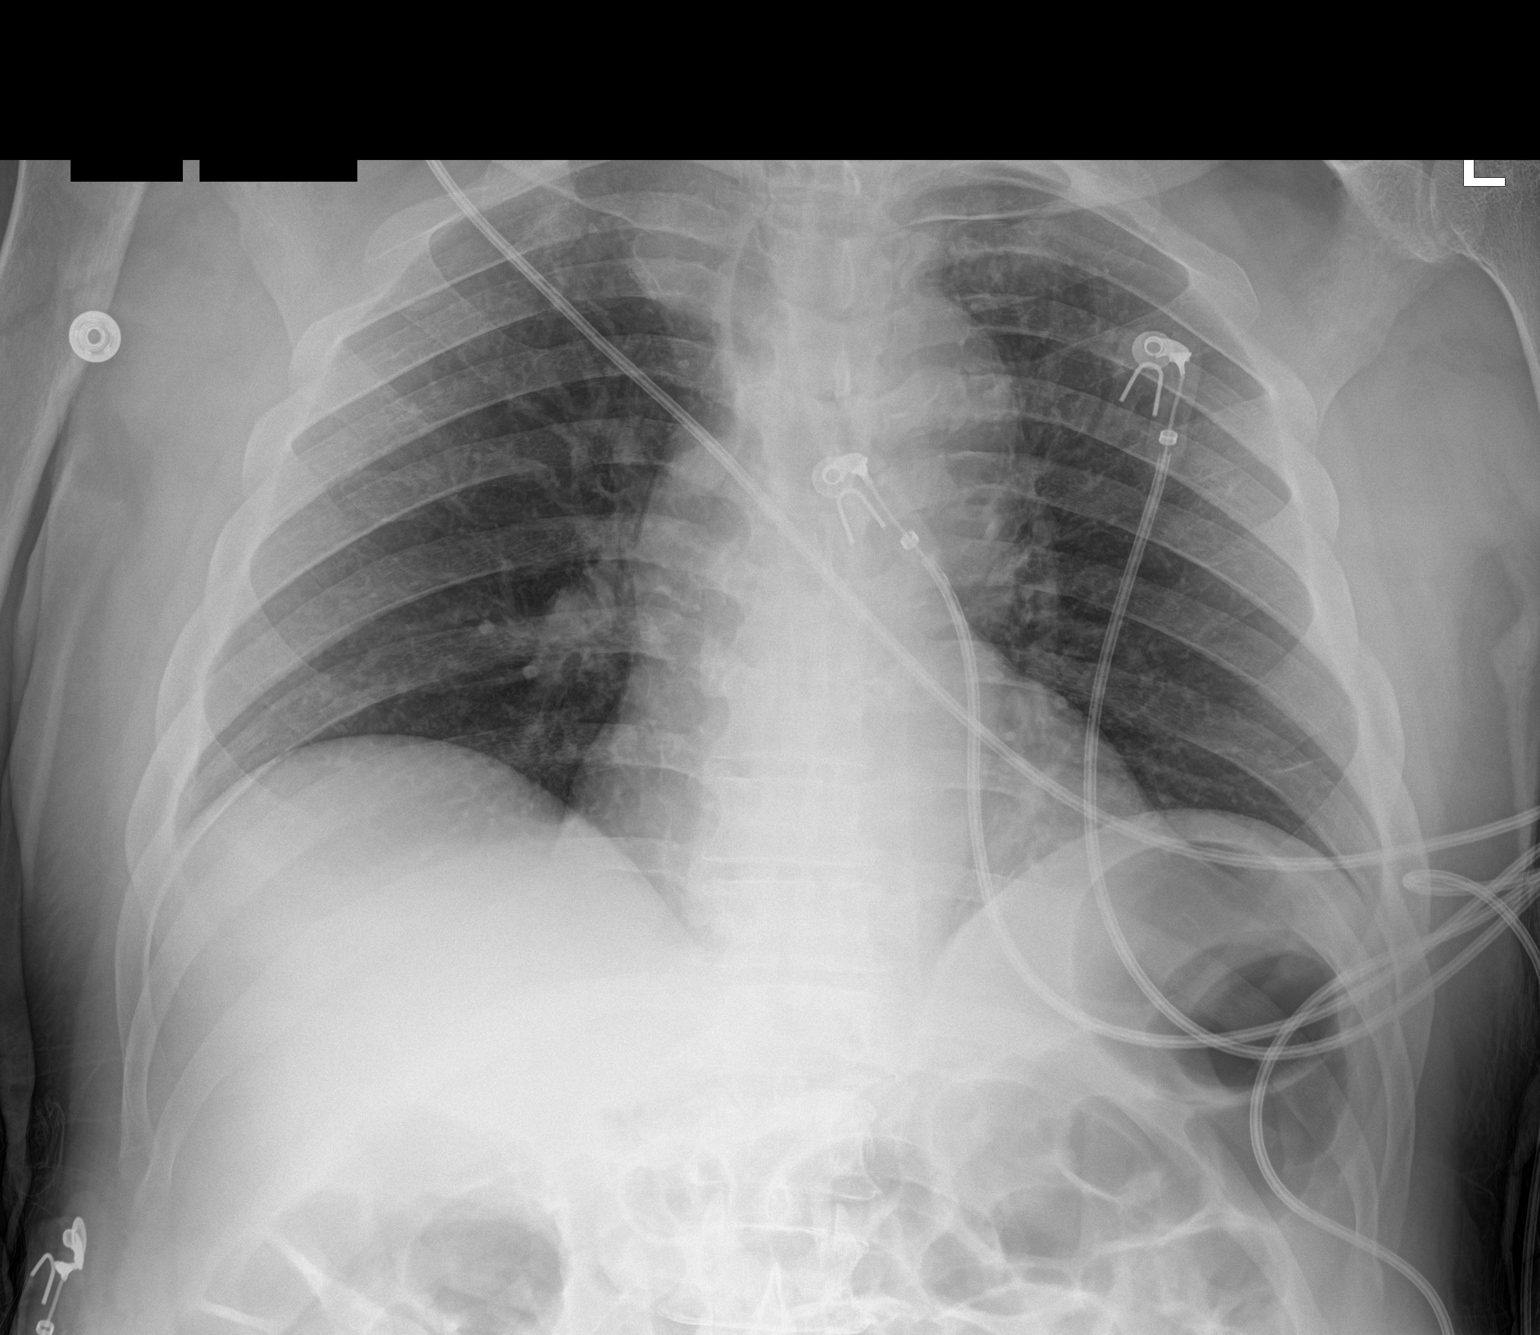

[1 of 1 positions shown; findings below may reference images not displayed]

FINDINGS: The heart size and mediastinal contours are within normal limits.
Both lungs are clear. The visualized skeletal structures are
unremarkable.
IMPRESSION: No active disease.

## 2020-10-31 IMAGING — US US ABDOMEN LIMITED
1 series · 14 of 25 positions shown · non-contrast
Comparison: None.

CLINICAL DATA: Fever.

EXAM:
ULTRASOUND ABDOMEN LIMITED RIGHT UPPER QUADRANT

[Series 1: us abdomen limited ruq · 14 of 33 slices shown]
[im 1/33]
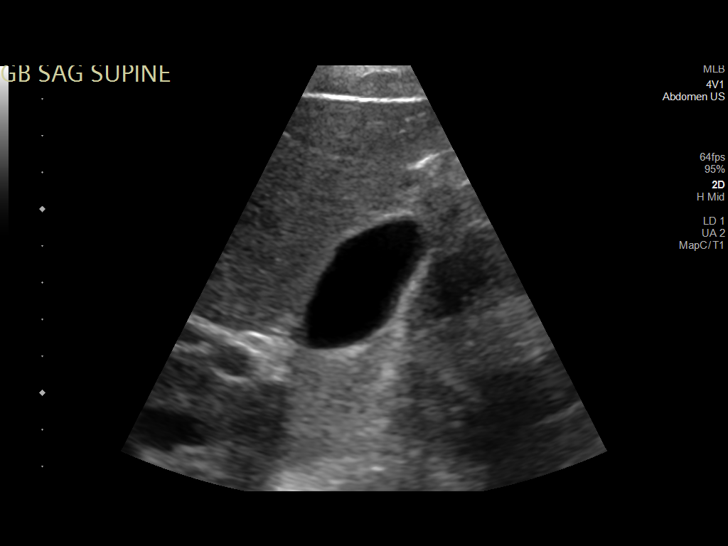
[im 3/33]
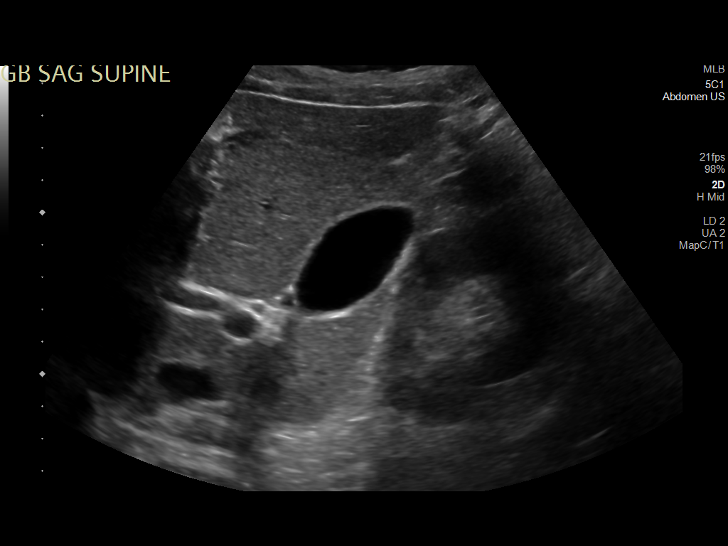
[im 6/33]
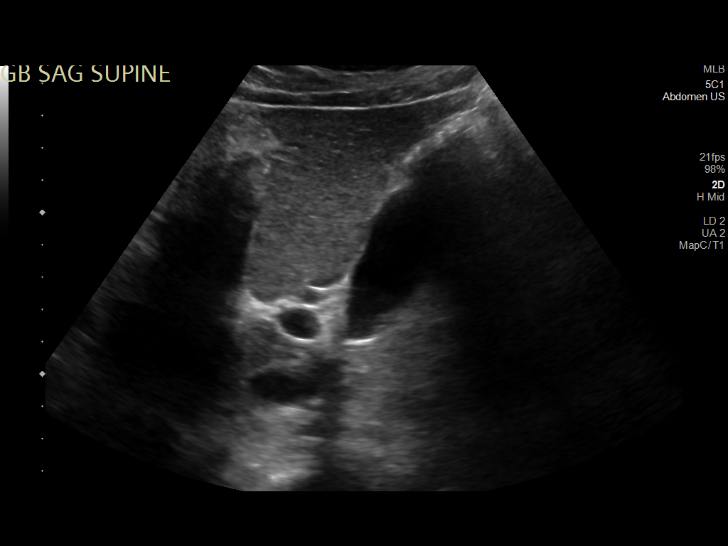
[im 9/33]
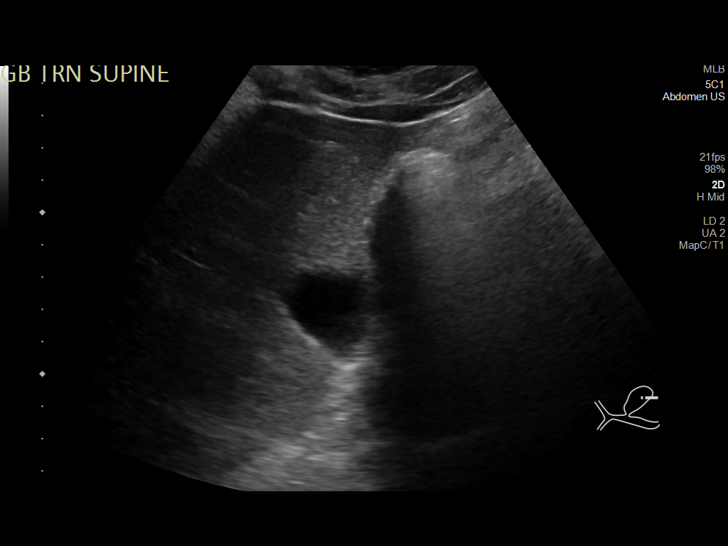
[im 11/33]
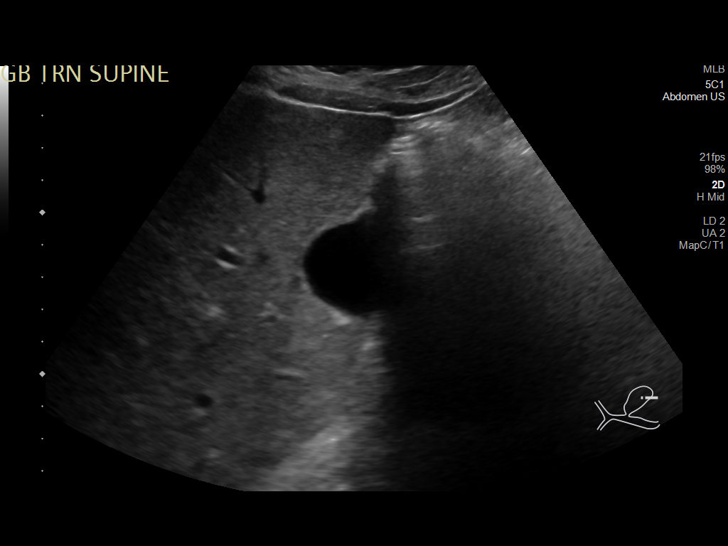
[im 13/33]
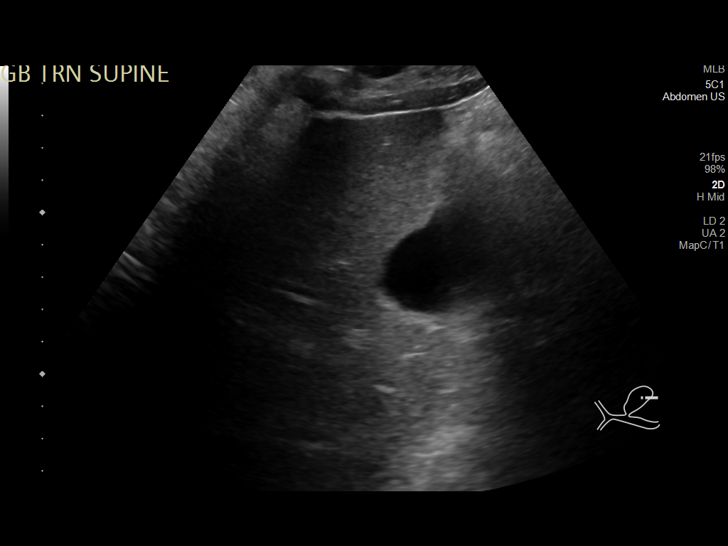
[im 15/33]
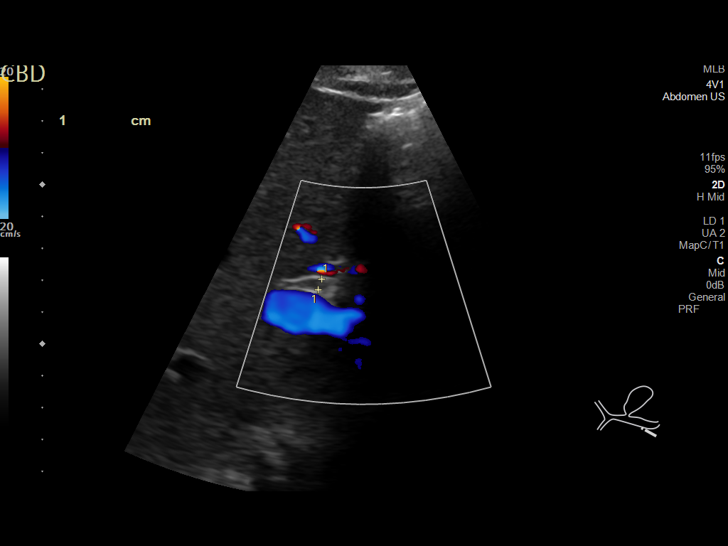
[im 18/33]
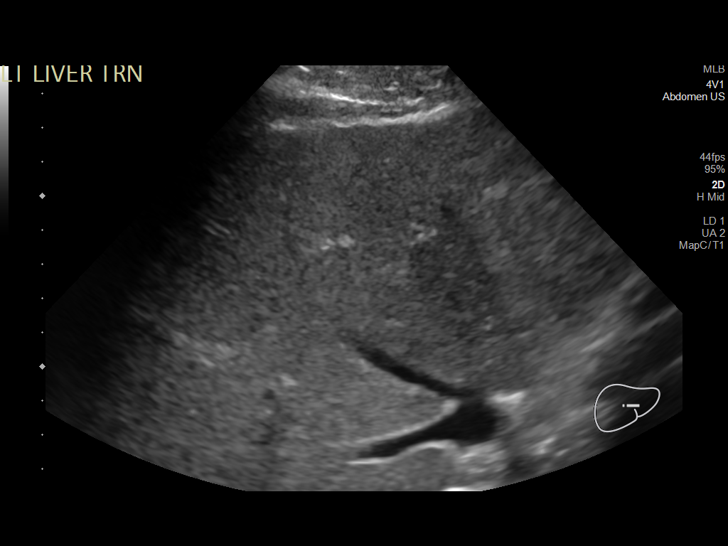
[im 21/33]
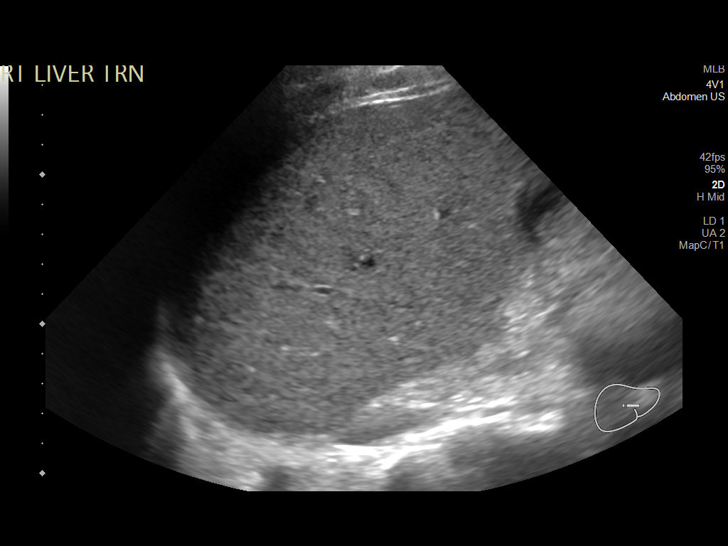
[im 22/33]
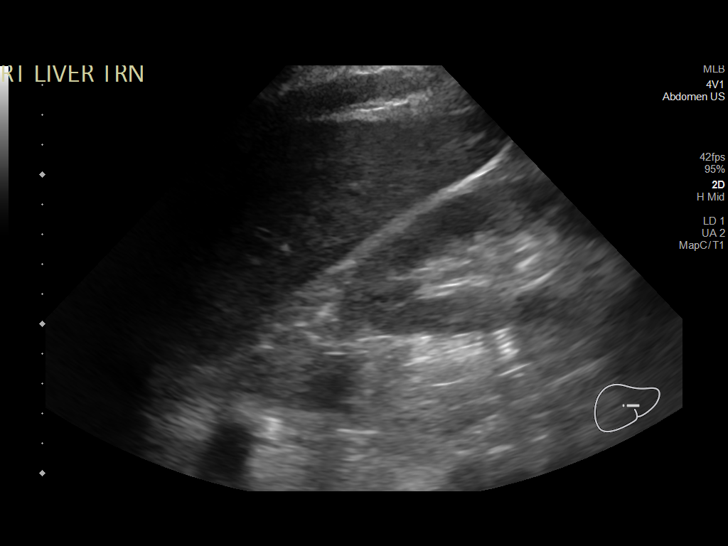
[im 25/33]
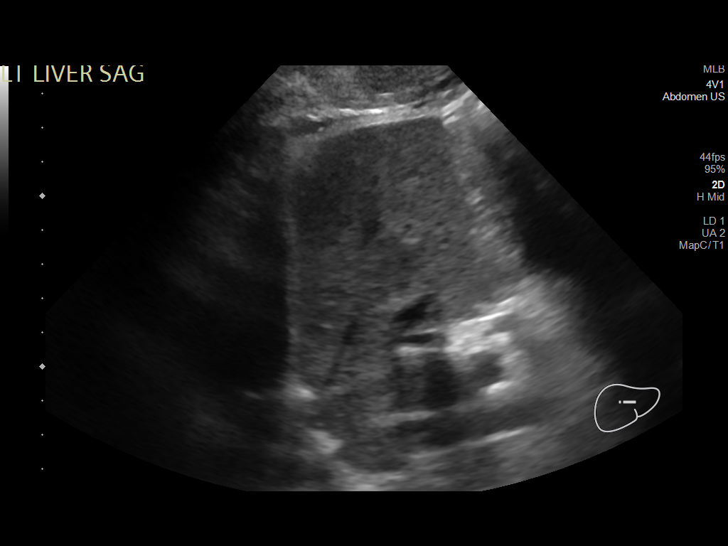
[im 27/33]
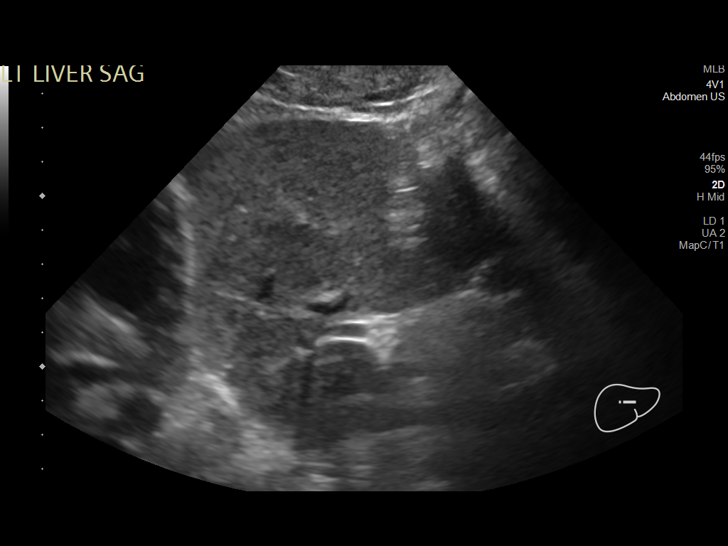
[im 30/33]
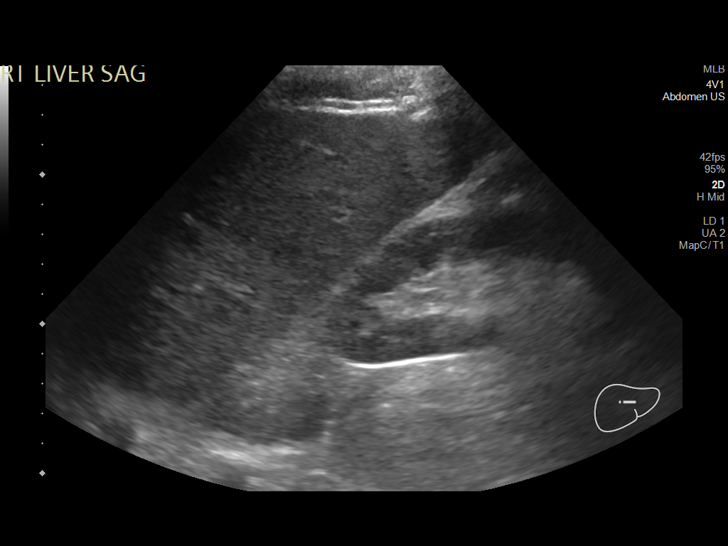
[im 33/33]
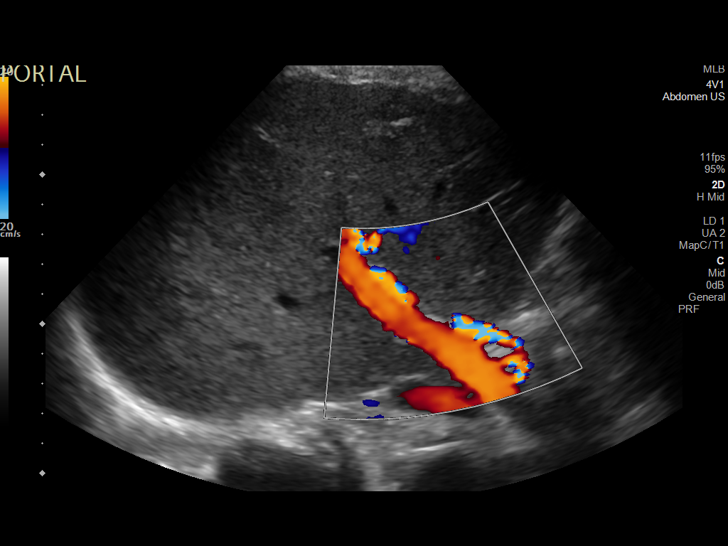

[14 of 25 positions shown; findings below may reference images not displayed]

FINDINGS: Gallbladder:

No gallstones or wall thickening visualized. No sonographic Murphy
sign noted by sonographer.

Common bile duct:

Diameter: 4 mm, normal.

Liver:

No focal lesion identified. Within normal limits in parenchymal
echogenicity. Portal vein is patent on color Doppler imaging with
normal direction of blood flow towards the liver.

Other: None.
IMPRESSION: Normal right upper quadrant ultrasound.

## 2020-11-01 IMAGING — XA IR REPLACE G-TUBE/COLONIC TUBE
2 series · 3 of 3 positions shown · non-contrast
Comparison: Abdominal radiograph-11/14/2019

INDICATION: Inadvertent removal of gastrostomy tube. Gastrostomy tube was placed
at an outside institution and currently has a Foley catheter
maintaining the gastrostomy tube track.

EXAM:
FLUOROSCOPIC GUIDED REPLACEMENT OF GASTROSTOMY TUBE

[Series 1: fl (-) angio · 1 of 1 slices shown (1 of 2)]
[im 1/1]
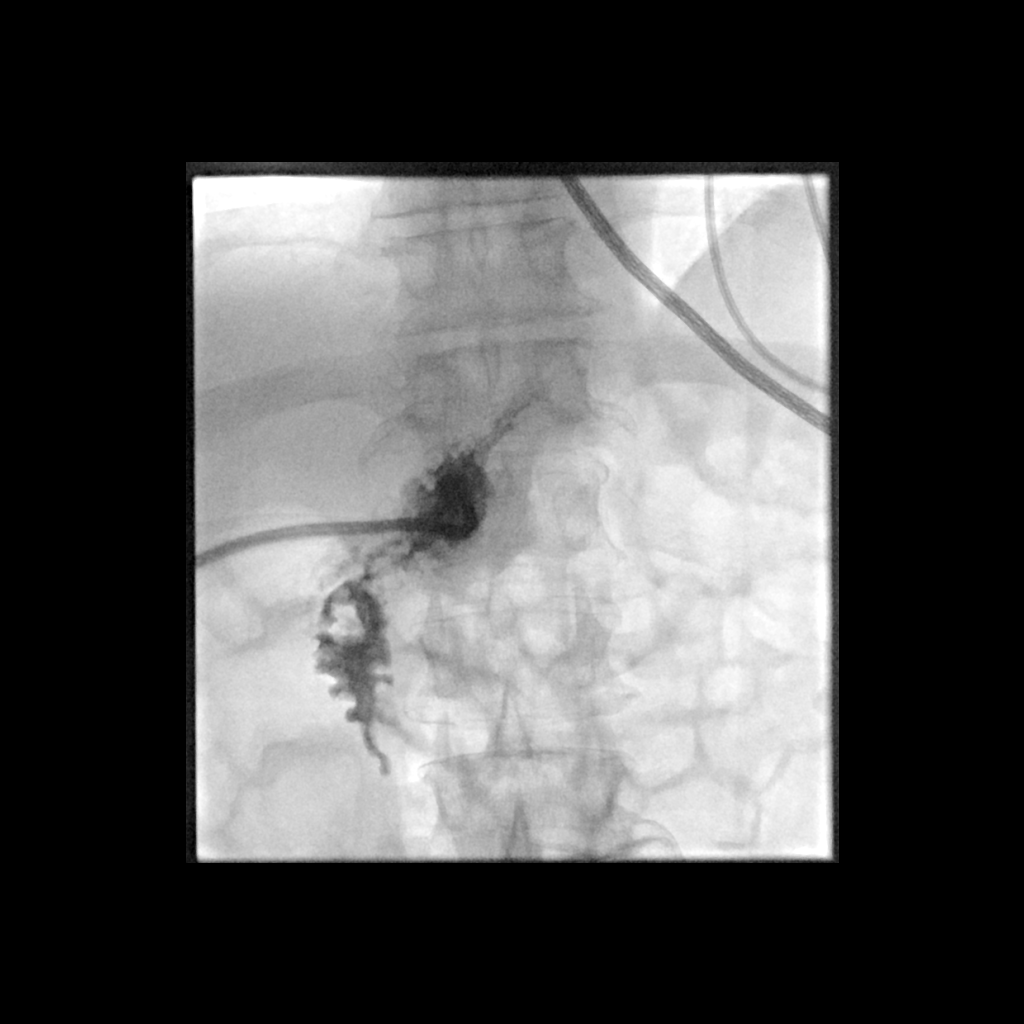

[Series 2: fl (-) angio · 2 of 2 slices shown (2 of 2)]
[im 1/2]
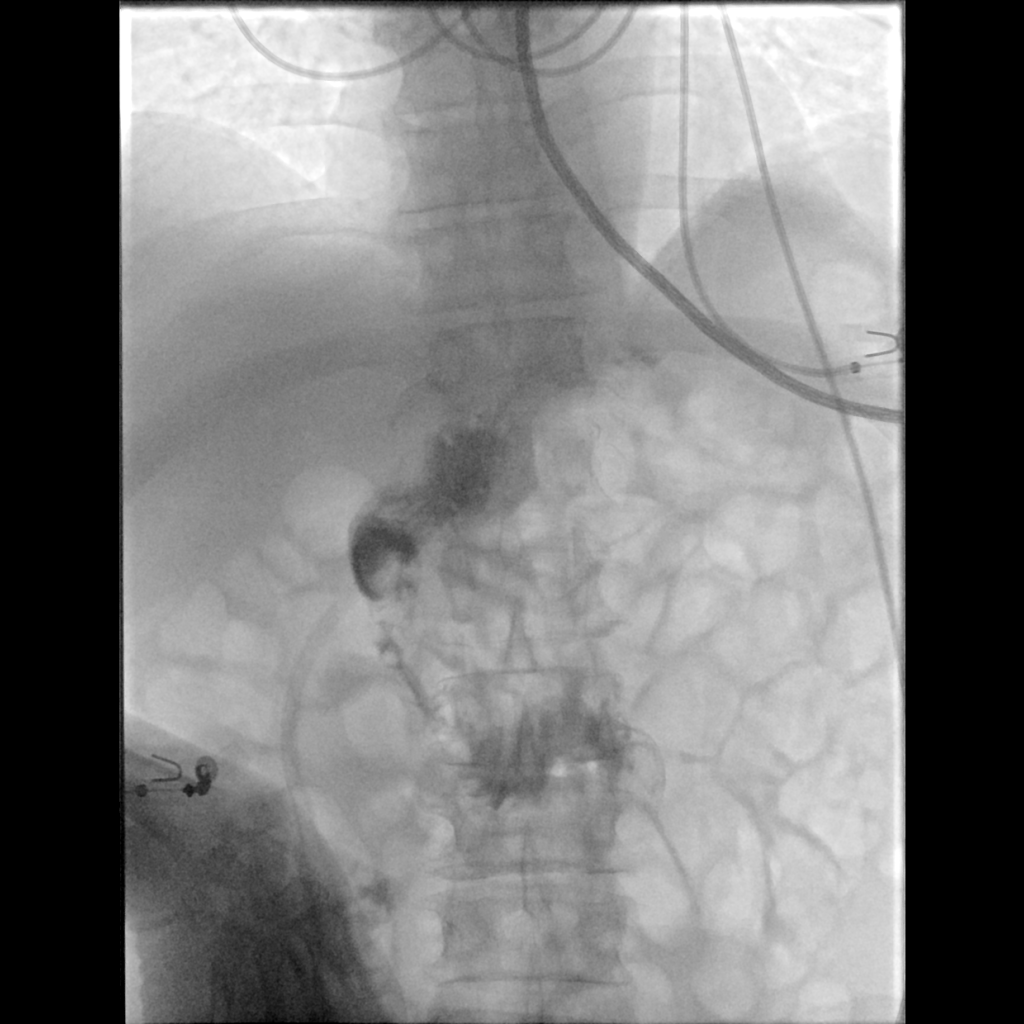
[im 2/2]
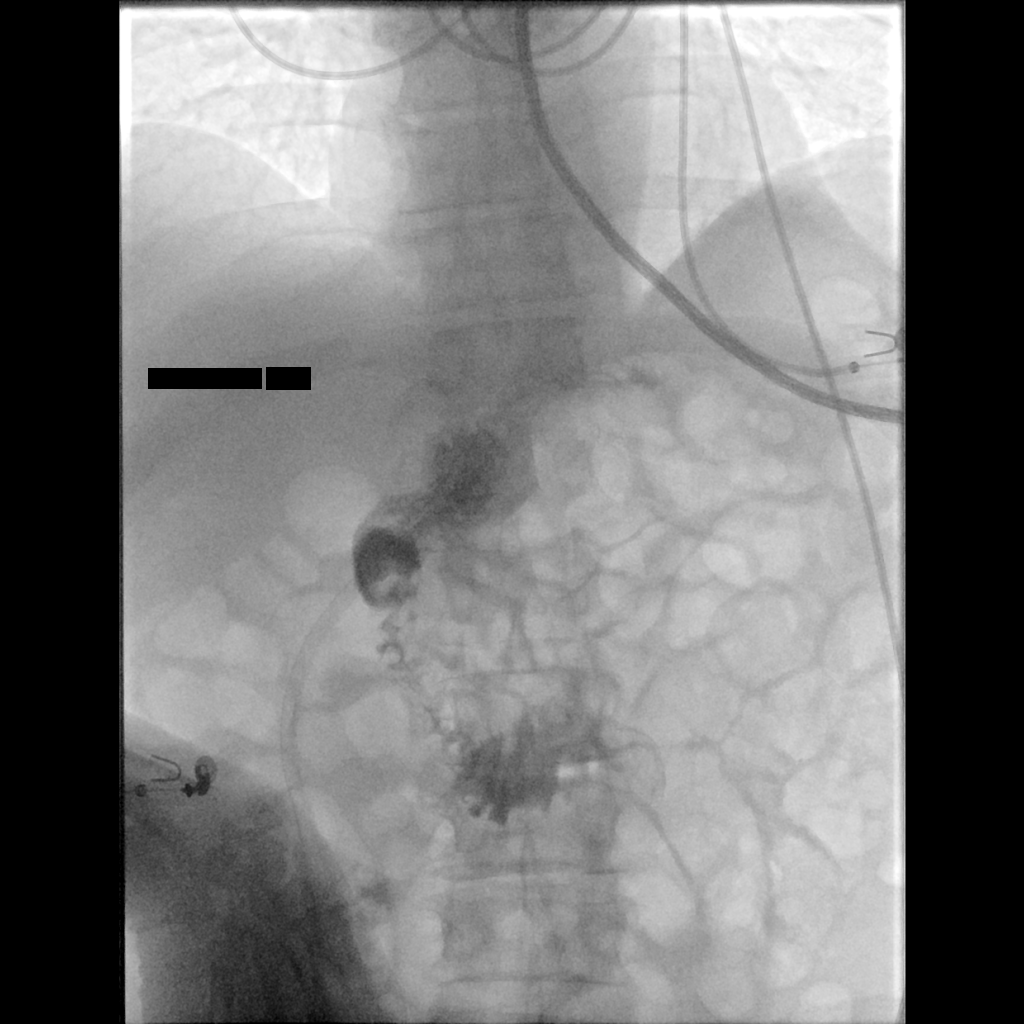

[3 of 3 positions shown; findings below may reference images not displayed]

MEDICATIONS:
None.

CONTRAST:  10mL OMNIPAQUE IOHEXOL 300 MG/ML SOLN administered into
the gastric lumen

FLUOROSCOPY TIME:  12 seconds (1 mGy)

COMPLICATIONS:
None immediate.

PROCEDURE:
Patient was placed supine on the fluoroscopy table. Contrast
injection demonstrated appropriate position functionality the Foley
catheter maintaining the track of the gastrostomy tube

The Foley catheter was removed intact and a new 18 French balloon
gastrostomy tube was inserted.

The balloon was inflated and disc was cinched. Contrast was injected
and several spot fluoroscopic images were obtained in various
obliquities to confirm appropriate intraluminal positioning. A
dressing was placed. The patient tolerated the procedure well
without immediate postprocedural complication.
IMPRESSION: Successful fluoroscopic guided replacement of a new 18-French
gastrostomy tube. The new gastrostomy tube is ready for immediate
use.

## 2020-11-02 IMAGING — DX DG ABDOMEN 1V
1 series · 1 of 1 positions shown · non-contrast
Comparison: 11/16/2019.  11/14/2019.

CLINICAL DATA: Ileus.

EXAM:
ABDOMEN - 1 VIEW

[abdomen kub]
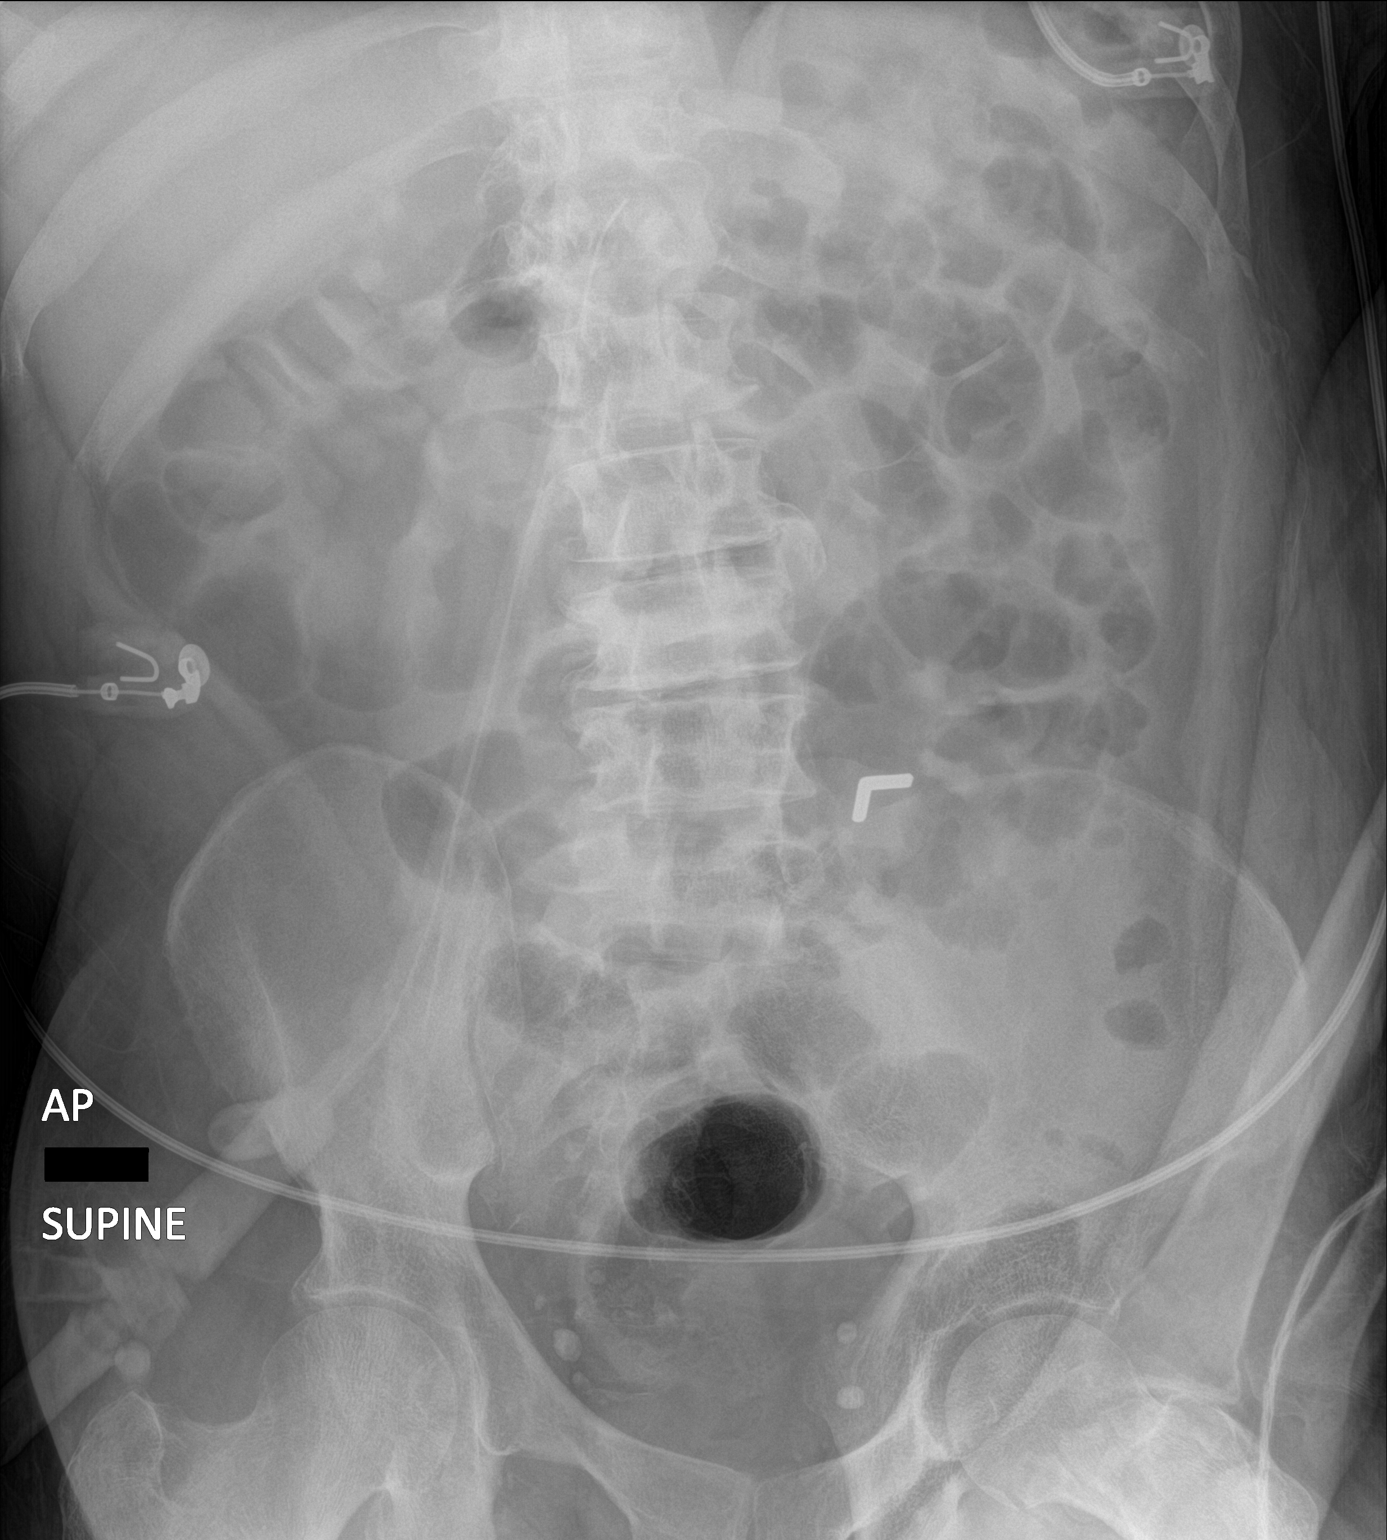

[1 of 1 positions shown; findings below may reference images not displayed]

FINDINGS: G-tube noted good anatomic position. Mildly prominent air-filled
loops of small and large bowel noted again consistent with adynamic
ileus. Follow-up exam suggested demonstrate resolution. Pelvic
calcifications consistent phleboliths. Degenerative changes lumbar
spine.
IMPRESSION: G-tube noted good position. Mildly prominent air-filled loops of
small large bowel again noted consistent with adynamic ileus.
Follow-up exam suggested to demonstrate resolution.

## 2022-05-19 DEATH — deceased
